# Patient Record
Sex: Female | Born: 1950 | Race: White | Hispanic: No | Smoking: Former smoker
Health system: Southern US, Community
[De-identification: ages and names within clinical notes are randomized; demographics above are authoritative.]

## PROBLEM LIST (undated history)

## (undated) DIAGNOSIS — M1711 Unilateral primary osteoarthritis, right knee: Secondary | ICD-10-CM

## (undated) DIAGNOSIS — K219 Gastro-esophageal reflux disease without esophagitis: Secondary | ICD-10-CM

## (undated) DIAGNOSIS — F329 Major depressive disorder, single episode, unspecified: Secondary | ICD-10-CM

## (undated) DIAGNOSIS — T4145XA Adverse effect of unspecified anesthetic, initial encounter: Secondary | ICD-10-CM

## (undated) DIAGNOSIS — E785 Hyperlipidemia, unspecified: Secondary | ICD-10-CM

## (undated) DIAGNOSIS — F419 Anxiety disorder, unspecified: Secondary | ICD-10-CM

## (undated) DIAGNOSIS — F32A Depression, unspecified: Secondary | ICD-10-CM

## (undated) DIAGNOSIS — F988 Other specified behavioral and emotional disorders with onset usually occurring in childhood and adolescence: Secondary | ICD-10-CM

## (undated) DIAGNOSIS — N809 Endometriosis, unspecified: Secondary | ICD-10-CM

## (undated) DIAGNOSIS — F819 Developmental disorder of scholastic skills, unspecified: Secondary | ICD-10-CM

## (undated) DIAGNOSIS — T8859XA Other complications of anesthesia, initial encounter: Secondary | ICD-10-CM

## (undated) DIAGNOSIS — T7840XA Allergy, unspecified, initial encounter: Secondary | ICD-10-CM

## (undated) DIAGNOSIS — R112 Nausea with vomiting, unspecified: Secondary | ICD-10-CM

## (undated) DIAGNOSIS — I10 Essential (primary) hypertension: Secondary | ICD-10-CM

## (undated) DIAGNOSIS — R05 Cough: Secondary | ICD-10-CM

## (undated) DIAGNOSIS — H269 Unspecified cataract: Secondary | ICD-10-CM

## (undated) DIAGNOSIS — M8580 Other specified disorders of bone density and structure, unspecified site: Secondary | ICD-10-CM

## (undated) DIAGNOSIS — B351 Tinea unguium: Secondary | ICD-10-CM

## (undated) DIAGNOSIS — J309 Allergic rhinitis, unspecified: Secondary | ICD-10-CM

## (undated) DIAGNOSIS — M47817 Spondylosis without myelopathy or radiculopathy, lumbosacral region: Secondary | ICD-10-CM

## (undated) DIAGNOSIS — G44219 Episodic tension-type headache, not intractable: Secondary | ICD-10-CM

## (undated) DIAGNOSIS — F341 Dysthymic disorder: Secondary | ICD-10-CM

## (undated) DIAGNOSIS — Z9889 Other specified postprocedural states: Secondary | ICD-10-CM

## (undated) HISTORY — DX: Endometriosis, unspecified: N80.9

## (undated) HISTORY — DX: Depression, unspecified: F32.A

## (undated) HISTORY — DX: Cough: R05

## (undated) HISTORY — DX: Essential (primary) hypertension: I10

## (undated) HISTORY — PX: EYE SURGERY: SHX253

## (undated) HISTORY — DX: Allergic rhinitis, unspecified: J30.9

## (undated) HISTORY — DX: Dysthymic disorder: F34.1

## (undated) HISTORY — DX: Tinea unguium: B35.1

## (undated) HISTORY — DX: Developmental disorder of scholastic skills, unspecified: F81.9

## (undated) HISTORY — DX: Gastro-esophageal reflux disease without esophagitis: K21.9

## (undated) HISTORY — PX: JOINT REPLACEMENT: SHX530

## (undated) HISTORY — DX: Unspecified cataract: H26.9

## (undated) HISTORY — DX: Episodic tension-type headache, not intractable: G44.219

## (undated) HISTORY — PX: OTHER SURGICAL HISTORY: SHX169

## (undated) HISTORY — DX: Hyperlipidemia, unspecified: E78.5

## (undated) HISTORY — DX: Allergy, unspecified, initial encounter: T78.40XA

## (undated) HISTORY — PX: COSMETIC SURGERY: SHX468

## (undated) HISTORY — DX: Other specified disorders of bone density and structure, unspecified site: M85.80

## (undated) HISTORY — DX: Anxiety disorder, unspecified: F41.9

## (undated) HISTORY — PX: COLONOSCOPY: SHX174

## (undated) HISTORY — PX: MENISCUS REPAIR: SHX5179

## (undated) HISTORY — DX: Other specified behavioral and emotional disorders with onset usually occurring in childhood and adolescence: F98.8

## (undated) HISTORY — DX: Major depressive disorder, single episode, unspecified: F32.9

---

## 1972-05-03 HISTORY — PX: NECK MASS EXCISION: SHX2079

## 1987-05-04 HISTORY — PX: LAPAROTOMY: SHX154

## 1992-05-03 HISTORY — PX: LAPAROSCOPY: SHX197

## 1999-03-25 ENCOUNTER — Other Ambulatory Visit: Admission: RE | Admit: 1999-03-25 | Discharge: 1999-03-25 | Payer: Self-pay | Admitting: Family Medicine

## 2000-09-27 ENCOUNTER — Encounter: Admission: RE | Admit: 2000-09-27 | Discharge: 2000-09-27 | Payer: Self-pay | Admitting: *Deleted

## 2002-03-09 ENCOUNTER — Ambulatory Visit (HOSPITAL_COMMUNITY): Admission: RE | Admit: 2002-03-09 | Discharge: 2002-03-09 | Payer: Self-pay | Admitting: *Deleted

## 2002-03-09 ENCOUNTER — Encounter (INDEPENDENT_AMBULATORY_CARE_PROVIDER_SITE_OTHER): Payer: Self-pay | Admitting: *Deleted

## 2003-05-04 ENCOUNTER — Encounter: Payer: Self-pay | Admitting: Family Medicine

## 2004-04-28 ENCOUNTER — Ambulatory Visit: Payer: Self-pay | Admitting: Family Medicine

## 2005-05-11 ENCOUNTER — Ambulatory Visit: Payer: Self-pay | Admitting: Family Medicine

## 2005-05-18 ENCOUNTER — Ambulatory Visit: Payer: Self-pay | Admitting: Family Medicine

## 2005-09-21 ENCOUNTER — Ambulatory Visit: Payer: Self-pay | Admitting: Family Medicine

## 2005-12-22 ENCOUNTER — Ambulatory Visit: Payer: Self-pay | Admitting: Family Medicine

## 2006-02-15 ENCOUNTER — Ambulatory Visit: Payer: Self-pay | Admitting: Family Medicine

## 2006-04-12 ENCOUNTER — Ambulatory Visit: Payer: Self-pay | Admitting: Family Medicine

## 2006-04-12 LAB — CONVERTED CEMR LAB
AST: 26 units/L (ref 0–37)
Chol/HDL Ratio, serum: 5.1
LDL DIRECT: 149.5 mg/dL

## 2006-07-28 ENCOUNTER — Encounter: Admission: RE | Admit: 2006-07-28 | Discharge: 2006-07-28 | Payer: Self-pay | Admitting: Family Medicine

## 2006-07-28 ENCOUNTER — Ambulatory Visit: Payer: Self-pay | Admitting: Family Medicine

## 2006-11-09 ENCOUNTER — Encounter: Payer: Self-pay | Admitting: Family Medicine

## 2006-12-29 ENCOUNTER — Telehealth: Payer: Self-pay | Admitting: Internal Medicine

## 2007-01-25 ENCOUNTER — Encounter: Payer: Self-pay | Admitting: Family Medicine

## 2007-01-25 DIAGNOSIS — F8189 Other developmental disorders of scholastic skills: Secondary | ICD-10-CM

## 2007-03-16 ENCOUNTER — Ambulatory Visit: Payer: Self-pay | Admitting: Family Medicine

## 2007-03-16 LAB — CONVERTED CEMR LAB
Bilirubin Urine: NEGATIVE
Glucose, Urine, Semiquant: NEGATIVE
Urobilinogen, UA: NEGATIVE

## 2007-03-23 ENCOUNTER — Encounter: Payer: Self-pay | Admitting: Family Medicine

## 2007-03-23 LAB — CONVERTED CEMR LAB
Basophils Relative: 0.7 % (ref 0.0–1.0)
Bilirubin, Direct: 0.1 mg/dL (ref 0.0–0.3)
CO2: 32 meq/L (ref 19–32)
Cholesterol: 160 mg/dL (ref 0–200)
Eosinophils Relative: 2.1 % (ref 0.0–5.0)
GFR calc Af Amer: 112 mL/min
Glucose, Bld: 95 mg/dL (ref 70–99)
HCT: 41 % (ref 36.0–46.0)
Hemoglobin: 14.5 g/dL (ref 12.0–15.0)
Lymphocytes Relative: 25.2 % (ref 12.0–46.0)
Monocytes Absolute: 0.6 10*3/uL (ref 0.2–0.7)
Monocytes Relative: 8.6 % (ref 3.0–11.0)
Neutro Abs: 4.4 10*3/uL (ref 1.4–7.7)
Neutrophils Relative %: 63.4 % (ref 43.0–77.0)
Potassium: 5.2 meq/L — ABNORMAL HIGH (ref 3.5–5.1)
Sodium: 142 meq/L (ref 135–145)
TSH: 1.73 microintl units/mL (ref 0.35–5.50)
Total Bilirubin: 0.5 mg/dL (ref 0.3–1.2)
Total Protein: 7.3 g/dL (ref 6.0–8.3)
VLDL: 43 mg/dL — ABNORMAL HIGH (ref 0–40)

## 2007-03-29 ENCOUNTER — Ambulatory Visit: Payer: Self-pay | Admitting: Family Medicine

## 2007-03-29 DIAGNOSIS — F341 Dysthymic disorder: Secondary | ICD-10-CM

## 2007-03-29 HISTORY — DX: Dysthymic disorder: F34.1

## 2007-04-17 ENCOUNTER — Encounter: Admission: RE | Admit: 2007-04-17 | Discharge: 2007-04-17 | Payer: Self-pay | Admitting: Obstetrics and Gynecology

## 2007-11-07 ENCOUNTER — Ambulatory Visit: Payer: Self-pay | Admitting: Family Medicine

## 2007-11-07 DIAGNOSIS — G44219 Episodic tension-type headache, not intractable: Secondary | ICD-10-CM

## 2007-11-07 DIAGNOSIS — R002 Palpitations: Secondary | ICD-10-CM | POA: Insufficient documentation

## 2007-11-07 HISTORY — DX: Episodic tension-type headache, not intractable: G44.219

## 2007-11-09 ENCOUNTER — Telehealth: Payer: Self-pay | Admitting: Family Medicine

## 2007-11-09 ENCOUNTER — Encounter: Payer: Self-pay | Admitting: Family Medicine

## 2007-12-11 ENCOUNTER — Ambulatory Visit: Payer: Self-pay

## 2007-12-11 ENCOUNTER — Ambulatory Visit: Payer: Self-pay | Admitting: Cardiology

## 2008-01-18 ENCOUNTER — Ambulatory Visit: Payer: Self-pay | Admitting: Family Medicine

## 2008-01-22 ENCOUNTER — Telehealth: Payer: Self-pay | Admitting: Family Medicine

## 2008-02-14 ENCOUNTER — Ambulatory Visit: Payer: Self-pay | Admitting: Licensed Clinical Social Worker

## 2008-02-27 ENCOUNTER — Telehealth: Payer: Self-pay | Admitting: Family Medicine

## 2008-08-06 ENCOUNTER — Encounter: Admission: RE | Admit: 2008-08-06 | Discharge: 2008-08-06 | Payer: Self-pay | Admitting: Obstetrics and Gynecology

## 2008-10-22 ENCOUNTER — Ambulatory Visit: Payer: Self-pay | Admitting: Family Medicine

## 2008-10-30 ENCOUNTER — Ambulatory Visit: Payer: Self-pay | Admitting: Family Medicine

## 2008-10-30 DIAGNOSIS — F988 Other specified behavioral and emotional disorders with onset usually occurring in childhood and adolescence: Secondary | ICD-10-CM

## 2008-10-30 DIAGNOSIS — L255 Unspecified contact dermatitis due to plants, except food: Secondary | ICD-10-CM

## 2008-10-30 DIAGNOSIS — B351 Tinea unguium: Secondary | ICD-10-CM | POA: Insufficient documentation

## 2008-10-30 HISTORY — DX: Tinea unguium: B35.1

## 2008-10-30 HISTORY — DX: Other specified behavioral and emotional disorders with onset usually occurring in childhood and adolescence: F98.8

## 2008-10-30 LAB — CONVERTED CEMR LAB
ALT: 26 units/L (ref 0–35)
BUN: 13 mg/dL (ref 6–23)
Basophils Relative: 0.4 % (ref 0.0–3.0)
Bilirubin, Direct: 0.1 mg/dL (ref 0.0–0.3)
CO2: 30 meq/L (ref 19–32)
Chloride: 105 meq/L (ref 96–112)
Cholesterol: 204 mg/dL — ABNORMAL HIGH (ref 0–200)
Creatinine, Ser: 0.5 mg/dL (ref 0.4–1.2)
Eosinophils Absolute: 0.2 10*3/uL (ref 0.0–0.7)
Eosinophils Relative: 3.7 % (ref 0.0–5.0)
HCT: 40.7 % (ref 36.0–46.0)
Ketones, ur: NEGATIVE mg/dL
Lymphs Abs: 1.6 10*3/uL (ref 0.7–4.0)
MCHC: 34.9 g/dL (ref 30.0–36.0)
MCV: 89.6 fL (ref 78.0–100.0)
Monocytes Absolute: 0.8 10*3/uL (ref 0.1–1.0)
Neutrophils Relative %: 58.6 % (ref 43.0–77.0)
Platelets: 273 10*3/uL (ref 150.0–400.0)
Potassium: 4.7 meq/L (ref 3.5–5.1)
RBC: 4.54 M/uL (ref 3.87–5.11)
TSH: 1.75 microintl units/mL (ref 0.35–5.50)
Total Protein: 7 g/dL (ref 6.0–8.3)
Urine Glucose: NEGATIVE mg/dL

## 2008-11-07 ENCOUNTER — Telehealth: Payer: Self-pay | Admitting: Family Medicine

## 2008-11-13 ENCOUNTER — Ambulatory Visit: Payer: Self-pay | Admitting: Family Medicine

## 2008-11-13 LAB — CONVERTED CEMR LAB
OCCULT 1: NEGATIVE
OCCULT 2: NEGATIVE
OCCULT 3: NEGATIVE

## 2008-11-26 ENCOUNTER — Encounter: Payer: Self-pay | Admitting: Family Medicine

## 2009-03-12 ENCOUNTER — Telehealth: Payer: Self-pay | Admitting: Family Medicine

## 2009-03-13 ENCOUNTER — Encounter (INDEPENDENT_AMBULATORY_CARE_PROVIDER_SITE_OTHER): Payer: Self-pay | Admitting: *Deleted

## 2009-03-14 ENCOUNTER — Ambulatory Visit: Payer: Self-pay | Admitting: Family Medicine

## 2009-03-18 ENCOUNTER — Ambulatory Visit: Payer: Self-pay | Admitting: Cardiology

## 2009-03-26 ENCOUNTER — Encounter (INDEPENDENT_AMBULATORY_CARE_PROVIDER_SITE_OTHER): Payer: Self-pay | Admitting: *Deleted

## 2009-03-31 ENCOUNTER — Ambulatory Visit: Payer: Self-pay | Admitting: Gastroenterology

## 2009-04-09 LAB — CONVERTED CEMR LAB
Calcium: 9 mg/dL (ref 8.4–10.5)
GFR calc non Af Amer: 91.78 mL/min (ref >60)
Sodium: 140 meq/L (ref 135–145)

## 2009-04-11 ENCOUNTER — Ambulatory Visit: Payer: Self-pay | Admitting: Gastroenterology

## 2009-04-16 ENCOUNTER — Telehealth: Payer: Self-pay | Admitting: Family Medicine

## 2009-04-22 ENCOUNTER — Encounter: Payer: Self-pay | Admitting: Gastroenterology

## 2009-04-23 LAB — HM COLONOSCOPY

## 2009-05-16 ENCOUNTER — Encounter: Payer: Self-pay | Admitting: Family Medicine

## 2009-05-21 ENCOUNTER — Encounter: Payer: Self-pay | Admitting: Family Medicine

## 2010-01-06 ENCOUNTER — Telehealth (INDEPENDENT_AMBULATORY_CARE_PROVIDER_SITE_OTHER): Payer: Self-pay | Admitting: *Deleted

## 2010-04-21 ENCOUNTER — Ambulatory Visit: Payer: Self-pay | Admitting: Family Medicine

## 2010-04-21 DIAGNOSIS — R0989 Other specified symptoms and signs involving the circulatory and respiratory systems: Secondary | ICD-10-CM | POA: Insufficient documentation

## 2010-04-21 DIAGNOSIS — R059 Cough, unspecified: Secondary | ICD-10-CM

## 2010-04-21 DIAGNOSIS — K219 Gastro-esophageal reflux disease without esophagitis: Secondary | ICD-10-CM

## 2010-04-21 DIAGNOSIS — J309 Allergic rhinitis, unspecified: Secondary | ICD-10-CM

## 2010-04-21 DIAGNOSIS — R05 Cough: Secondary | ICD-10-CM | POA: Insufficient documentation

## 2010-04-21 HISTORY — DX: Gastro-esophageal reflux disease without esophagitis: K21.9

## 2010-04-21 HISTORY — DX: Cough, unspecified: R05.9

## 2010-04-21 HISTORY — DX: Allergic rhinitis, unspecified: J30.9

## 2010-04-22 ENCOUNTER — Encounter: Payer: Self-pay | Admitting: Family Medicine

## 2010-04-23 ENCOUNTER — Telehealth: Payer: Self-pay | Admitting: Family Medicine

## 2010-04-24 ENCOUNTER — Ambulatory Visit: Payer: Self-pay

## 2010-05-05 ENCOUNTER — Telehealth: Payer: Self-pay | Admitting: Family Medicine

## 2010-05-06 ENCOUNTER — Encounter: Payer: Self-pay | Admitting: Family Medicine

## 2010-05-12 ENCOUNTER — Encounter: Payer: Self-pay | Admitting: *Deleted

## 2010-05-15 ENCOUNTER — Ambulatory Visit
Admission: RE | Admit: 2010-05-15 | Discharge: 2010-05-15 | Payer: Self-pay | Source: Home / Self Care | Attending: Internal Medicine | Admitting: Internal Medicine

## 2010-05-15 DIAGNOSIS — E785 Hyperlipidemia, unspecified: Secondary | ICD-10-CM | POA: Diagnosis present

## 2010-05-15 DIAGNOSIS — I10 Essential (primary) hypertension: Secondary | ICD-10-CM | POA: Insufficient documentation

## 2010-05-15 HISTORY — DX: Hyperlipidemia, unspecified: E78.5

## 2010-05-15 HISTORY — DX: Essential (primary) hypertension: I10

## 2010-05-24 ENCOUNTER — Encounter: Payer: Self-pay | Admitting: Obstetrics and Gynecology

## 2010-05-31 LAB — CONVERTED CEMR LAB: Pap Smear: NORMAL

## 2010-06-02 NOTE — Miscellaneous (Signed)
Summary: Flu Vaccination/[facility]  Flu Vaccination/[facility]   Imported By: Maryln Gottron 05/23/2009 12:50:29  _____________________________________________________________________  External Attachment:    Type:   Image     Comment:   External Document

## 2010-06-02 NOTE — Miscellaneous (Signed)
Summary: flu inj given at [facility]   Clinical Lists Changes  Observations: Added new observation of FLU VAX: Historical (05/16/2009 10:56)      Immunization History:  Influenza Immunization History:    Influenza:  historical (05/16/2009)  per pt had inj at [facility]

## 2010-06-02 NOTE — Progress Notes (Signed)
Summary: Flu vaccine     Flu vaccine was given on 01/03/10 per [facility]

## 2010-06-04 NOTE — Letter (Signed)
Summary: Generic Letter  [place] at [facility]  [address] [place], [place] [address]   Phone: [phone]  Fax: [phone]    05/12/2010  [facility] [address] [place], [place]  [address]  Dear Ms. Chiappetta,  Your carotid doppler was normal. If you have any questions, please give [place] a call at [phone].         Sincerely,   Tor Netters, CMA (AAMA)

## 2010-06-04 NOTE — Miscellaneous (Signed)
Summary: Orders Update  Clinical Lists Changes  Orders: Added new Test order of Carotid Duplex (Carotid Duplex) - Signed 

## 2010-06-04 NOTE — Assessment & Plan Note (Signed)
Summary: BP ISSUES//CCM   Vital Signs:  Patient profile:   60 year old female Weight:      172 pounds Pulse rate:   112 / minute BP sitting:   144 / 100  (left arm)  Vitals Entered By: Kyung Rudd, CMA (May 15, 2010 8:44 AM) CC: pt c/o elevated BP...also needs refills   CC:  pt c/o elevated BP...also needs refills.  History of Present Illness: Patient presents to clinic as a workin for evaluation of elevated bp. Notes recently increased BP without ha or dizziness. Reviewed outpt bp log with sbp 140's 150's predominantly. H/o ADD previously taking adderall but currently out of medication. H/o hyperlipidemia and has self dc'ed simvastatin due to possible muscle weakness. Sx's resolved after stopping medication. Also intolerant of lipitor in the past. States recently underwent carotid US. Reviewed nl results with pt.  Current Medications (verified): 1)  Alprazolam 2 Mg  Tabs (Alprazolam) .[name].. 1  Three Times A Day 2)  Esgic-Plus 50-500-40 Mg  Tabs (Butalbital-Apap-Caffeine) .... As Needed  1-2 Q4h As Needed Headache Not Over 4 Per Day 3)  Adderall Xr 20 Mg  Cp24 (Amphetamine-Dextroamphetamine) .... Take 1 Capsule By Mouth Once A Day Fill For Dec 2010 4)  Calcium-Vitamin D 500-125 Mg-Unit  Tabs (Calcium-Vitamin D) 5)  Cyproheptadine Hcl 4 Mg  Tabs (Cyproheptadine Hcl) .[name].. 1 Three Times A Day As Needed For Itching 6)  Astelin 137 Mcg/spray  Soln (Azelastine Hcl) .... 2 Spray Each Nostril Am and Pm 7)  Simvastatin 80 Mg  Tabs (Simvastatin) .[name].. 1 By Mouth At Bedtime 8)  Effexor Xr 150 Mg Xr24h-Cap (Venlafaxine Hcl) .[name].. 1 By Mouth Once Daily 9)  Lotrisone 1-0.05 % Crea (Clotrimazole-Betamethasone) .... Apply Bid 10)  Premarin 0.625 Mg/gm Crea (Estrogens, Conjugated) .... Insert Applicator 11)  Metronidazole 500 Mg Tabs (Metronidazole) .[name].. 1 Tid 12)  Adderall Xr 20 Mg Xr24h-Cap (Amphetamine-Dextroamphetamine) .[name].. 1 By Mouth Once Daily  Fill For Jan 2011 13)  Adderall Xr 20 Mg  Xr24h-Cap (Amphetamine-Dextroamphetamine) .[name].. 1 By Mouth Once Daily  Fill For Feb 2011  Allergies (verified): 1)  ! Sulfamethoxazole (Sulfamethoxazole)  Past History:  Past medical, surgical, family and social histories (including risk factors) reviewed, and no changes noted (except as noted below).  Past Medical History: Reviewed history from 04/21/2010 and no changes required. learning disability hyperlipidemia chronic anxiety and depression GERD  Family History: Reviewed history and no changes required.  Social History: Reviewed history and no changes required.  Review of Systems General:  Denies chills, fever, and sweats. Eyes:  Denies eye irritation, eye pain, and red eye. CV:  Denies chest pain or discomfort, fainting, near fainting, and shortness of breath with exertion. Resp:  Denies chest discomfort, chest pain with inspiration, cough, and shortness of breath. GI:  Denies abdominal pain and bloody stools.  Physical Exam  General:  Well-developed,well-nourished,in no acute distress; alert,appropriate and cooperative throughout examination Head:  Normocephalic and atraumatic without obvious abnormalities. No apparent alopecia or balding. Eyes:  pupils equal, pupils round, corneas and lenses clear, and no injection.   Ears:  no external deformities.   Nose:  no external deformity.   Neck:  No deformities, masses, or tenderness noted. No carotid bruits Lungs:  Normal respiratory effort, chest expands symmetrically. Lungs are clear to auscultation, no crackles or wheezes. Heart:  Normal rate and regular rhythm. S1 and S2 normal without gallop, murmur, click, rub or other extra sounds. Neurologic:  alert & oriented X3 and gait normal.  Skin:  turgor normal, color normal, and no rashes.     Impression & Recommendations:  Problem # 1:  ESSENTIAL HYPERTENSION, BENIGN (ICD-401.1) Assessment New New formal diagnosis. Asx. Recommend low sodium diet, regular aerobic  exercise at least 4x/wk and wt loss. Maintain oupt bp log. F/u in 6wks for review of bp control.  Problem # 2:  HYPERLIPIDEMIA (ICD-272.4) Assessment: Deteriorated Stop zocor. Avoid lipitor. Attempt low dose pravastatin. Obtain flp/lft prior to next visit.  The following medications were removed from the medication list:    Simvastatin 80 Mg Tabs (Simvastatin) .[name]... 1 by mouth at bedtime Her updated medication list for this problem includes:    Pravachol 20 Mg Tabs (Pravastatin sodium) ..... One by mouth qd  Problem # 3:  ADD (ICD-314.00) Assessment: Unchanged Off adderall. Recommend avoiding stimulant medication currently due to elevated BP. States understanding and believes ADD under adequate control.  Complete Medication List: 1)  Alprazolam 2 Mg Tabs (Alprazolam) .[name].. 1  three times a day 2)  Esgic-plus 50-500-40 Mg Tabs (Butalbital-apap-caffeine) .... As needed  1-2 q4h as needed headache not over 4 per day 3)  Calcium-vitamin D 500-125 Mg-unit Tabs (Calcium-vitamin d) 4)  Cyproheptadine Hcl 4 Mg Tabs (Cyproheptadine hcl) .[name].. 1 three times a day as needed for itching 5)  Astelin 137 Mcg/spray Soln (Azelastine hcl) .... 2 spray each nostril am and pm 6)  Effexor Xr 150 Mg Xr24h-cap (Venlafaxine hcl) .[name].. 1 by mouth once daily 7)  Lotrisone 1-0.05 % Crea (Clotrimazole-betamethasone) .... Apply bid 8)  Premarin 0.625 Mg/gm Crea (Estrogens, conjugated) .... Insert applicator 9)  Metronidazole 500 Mg Tabs (Metronidazole) .[name].. 1 tid 10)  Adderall Xr 20 Mg Xr24h-cap (Amphetamine-dextroamphetamine) .[name].. 1 by mouth once daily  fill for jan 2011 11)  Adderall Xr 20 Mg Xr24h-cap (Amphetamine-dextroamphetamine) .[name].. 1 by mouth once daily  fill for feb 2011 12)  Pravachol 20 Mg Tabs (Pravastatin sodium) .... One by mouth qd  Patient Instructions: 1)  Followup 6 week with Dr. Rodena Medin. 2)  Hepatic Panel prior to visit, ICD-9:272.4 3)  Lipid Panel prior to visit,  ICD-9:272.4 Prescriptions: PRAVACHOL 20 MG TABS (PRAVASTATIN SODIUM) one by mouth qd  #30 x 6   Entered and Authorized by:   Edwyna Perfect MD   Signed by:   Edwyna Perfect MD on 05/15/2010   Method used:   Print then Give to Patient   RxID:   [id number]    Orders Added: 1)  Est. Patient Level IV [46962]

## 2010-06-04 NOTE — Progress Notes (Signed)
  Phone Note From Pharmacy   Caller: [facility] Summary of Call: [facility] is calling to see if you wanted zocor 80 mg for this patient because of side effects. would you like pravastatin or lipitor? info has been faxed to office [phone] reference number. [phone] #2 to pharmacy. Initial call taken by: Kern Reap CMA Duncan Dull),  May 05, 2010 11:59 AM  Follow-up for Phone Call        continue simvastatin

## 2010-06-04 NOTE — Assessment & Plan Note (Signed)
Summary: MED CK / REFILL  // RS   Vital Signs:  Patient profile:   60 year old female Weight:      175 pounds BMI:     31.61 Temp:     99.3 degrees F oral Pulse rate:   93 / minute Pulse rhythm:   regular BP sitting:   132 / 90  (left arm) Cuff size:   regular  Vitals Entered By: Alfred Levins, CMA (April 21, 2010 4:28 PM) CC: renew meds   History of Present Illness: This at some-year-old white widowed think widowed patient is unknown was certainly feel 2 years ago says caused the patient to have a chronic anxiety and depression that she has improved she she relates. Her present complaint is that of a persisting cough and was seen previously in regard to infection but was not remarkable. I have some concern as to whether this esophageal reflux cough Patient is in for refill of her medication  Current Medications (verified): 1)  Alprazolam 2 Mg  Tabs (Alprazolam) .[name].. 1  Three Times A Day 2)  Esgic-Plus 50-500-40 Mg  Tabs (Butalbital-Apap-Caffeine) .... As Needed  1-2 Q4h As Needed Headache Not Over 4 Per Day 3)  Adderall Xr 20 Mg  Cp24 (Amphetamine-Dextroamphetamine) .... Take 1 Capsule By Mouth Once A Day Fill For Dec 2010 4)  Calcium-Vitamin D 500-125 Mg-Unit  Tabs (Calcium-Vitamin D) 5)  Cyproheptadine Hcl 4 Mg  Tabs (Cyproheptadine Hcl) .[name].. 1 Three Times A Day As Needed For Itching 6)  Astelin 137 Mcg/spray  Soln (Azelastine Hcl) .... 2 Spray Each Nostril Am and Pm 7)  Simvastatin 80 Mg  Tabs (Simvastatin) .[name].. 1 Hs 8)  Effexor Xr 150 Mg Xr24h-Cap (Venlafaxine Hcl) .[name].. 1 By Mouth Once Daily 9)  Cymbalta 60 Mg Cpep (Duloxetine Hcl) .... 2 Caps Qd 10)  Lotrisone 1-0.05 % Crea (Clotrimazole-Betamethasone) .... Apply Bid 11)  Lamisil 250 Mg Tabs (Terbinafine Hcl) .[name].. 1  Once Daily X 3 Months 12)  Premarin 0.625 Mg/gm Crea (Estrogens, Conjugated) .... Insert Applicator 13)  Metronidazole 500 Mg Tabs (Metronidazole) .[name].. 1 Tid 14)  Adderall Xr 20 Mg Xr24h-Cap  (Amphetamine-Dextroamphetamine) .[name].. 1 By Mouth Once Daily  Fill For Jan 2011 15)  Adderall Xr 20 Mg Xr24h-Cap (Amphetamine-Dextroamphetamine) .[name].. 1 By Mouth Once Daily  Fill For Feb 2011  Allergies (verified): 1)  ! Sulfamethoxazole (Sulfamethoxazole)  Past History:  Past Medical History: learning disability hyperlipidemia chronic anxiety and depression GERD  Physical Exam  General:  Well-developed,well-nourished,in no acute distress; alert,appropriate and cooperative throughout examination Head:  Normocephalic and atraumatic without obvious abnormalities. No apparent alopecia or balding. Eyes:  No corneal or conjunctival inflammation noted. EOMI. Perrla. Funduscopic exam benign, without hemorrhages, exudates or papilledema. Vision grossly normal. Ears:  External ear exam shows no significant lesions or deformities.  Otoscopic examination reveals clear canals, tympanic membranes are intact bilaterally without bulging, retraction, inflammation or discharge. Hearing is grossly normal bilaterally. Nose:  External nasal examination shows no deformity or inflammation. Nasal mucosa are pink and moist without lesions or exudates. Mouth:  Oral mucosa and oropharynx without lesions or exudates.  Teeth in good repair. Neck:  No deformities, masses, or tenderness noted. Chest Wall:  No deformities, masses, or tenderness noted. Breasts:  not examined Lungs:  Normal respiratory effort, chest expands symmetrically. Lungs are clear to auscultation, no crackles or wheezes. Heart:  Normal rate and regular rhythm. S1 and S2 normal without gallop, murmur, click, rub or other extra sounds. Abdomen:  Bowel sounds positive,abdomen soft and non-tender without masses, organomegaly or hernias noted.   Impression & Recommendations:  Problem # 1:  COUGH (ICD-786.2) Assessment New Hydromet 1-2 teaspoons q.4 h. p.r.n. for cough AcipHex 20 mg for GERD  Problem # 2:  GERD (ICD-530.81) Assessment:  New AcipHex 20 mg q.d.  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 Cymbalta 60 mg 2 Q. day  Problem # 5:  ADD (ICD-314.00) Assessment: Unchanged Adderall XR 20 mg one cap q. day  Problem # 6:  ANXIETY DEPRESSION (ICD-300.4)  Complete Medication List: 1)  Alprazolam 2 Mg Tabs (Alprazolam) .[name].. 1  three times a day 2)  Esgic-plus 50-500-40 Mg Tabs (Butalbital-apap-caffeine) .... As needed  1-2 q4h as needed headache not over 4 per day 3)  Adderall Xr 20 Mg Cp24 (Amphetamine-dextroamphetamine) .... Take 1 capsule by mouth once a day fill for dec 2010 4)  Calcium-vitamin D 500-125 Mg-unit Tabs (Calcium-vitamin d) 5)  Cyproheptadine Hcl 4 Mg Tabs (Cyproheptadine hcl) .[name].. 1 three times a day as needed for itching 6)  Astelin 137 Mcg/spray Soln (Azelastine hcl) .... 2 spray each nostril am and pm 7)  Simvastatin 80 Mg Tabs (Simvastatin) .[name].. 1 by mouth at bedtime 8)  Effexor Xr 150 Mg Xr24h-cap (Venlafaxine hcl) .[name].. 1 by mouth once daily 9)  Lotrisone 1-0.05 % Crea (Clotrimazole-betamethasone) .... Apply bid 10)  Premarin 0.625 Mg/gm Crea (Estrogens, conjugated) .... Insert applicator 11)  Metronidazole 500 Mg Tabs (Metronidazole) .[name].. 1 tid 12)  Adderall Xr 20 Mg Xr24h-cap (Amphetamine-dextroamphetamine) .[name].. 1 by mouth once daily  fill for jan 2011 13)  Adderall Xr 20 Mg Xr24h-cap (Amphetamine-dextroamphetamine) .[name].. 1 by mouth once daily  fill for feb 2011  Patient Instructions: 1)  failure cough is secondary to GERD, esophageal reflux 2)  A KUB was better and will refill all her medications and return for a complete physical examination when he did be Prescriptions: EFFEXOR XR 150 MG XR24H-CAP (VENLAFAXINE HCL) 1 by mouth once daily  #90 x 3   Entered and Authorized by:   Judithann Sheen MD   Signed by:   Judithann Sheen MD on 04/21/2010   Method used:   Faxed to ...       [facility] (mail-order)             , [place]         Ph: [phone]       Fax: [phone]   RxID:   [id number] SIMVASTATIN 80 MG  TABS (SIMVASTATIN) 1 hs  #90 x 3   Entered and Authorized by:   Judithann Sheen MD   Signed by:   Judithann Sheen MD on 04/21/2010   Method used:   Faxed to ...       [facility] (mail-order)             , [place]         Ph: [phone]       Fax: [phone]   RxID:   [id number] ADDERALL XR 20 MG  CP24 (AMPHETAMINE-DEXTROAMPHETAMINE) Take 1 capsule by mouth once a day fill for dec 2010  #270 x 0   Entered and Authorized by:   Judithann Sheen MD   Signed by:   Judithann Sheen MD on 04/21/2010   Method used:  Printed then faxed to ...       [facility] (mail-order)             , [place]         Ph: [phone]       Fax: [phone]   RxID:   [id number] ESGIC-PLUS 50-500-40 MG  TABS [facility]) as needed  1-2 q4h as needed headache not over 4 per day  #330 x 1   Entered and Authorized by:   Judithann Sheen MD   Signed by:   Judithann Sheen MD on 04/21/2010   Method used:   Printed then faxed to ...       [facility] (mail-order)             , [place]         Ph: [phone]       Fax: [phone]   RxID:   [id number] ALPRAZOLAM 2 MG  TABS (ALPRAZOLAM) 1  three times a day  #270 x 1   Entered and Authorized by:   Judithann Sheen MD   Signed by:   Judithann Sheen MD on 04/21/2010   Method used:   Printed then faxed to ...       [facility] (mail-order)             , [place]         Ph: [phone]       Fax: [phone]   RxID:   [id number]    Orders Added: 1)  Doppler Referral [Doppler] 2)  Est. Patient Level IV [37628]

## 2010-06-04 NOTE — Progress Notes (Signed)
Summary: [facility] needs clarification on Zocor 80mg   Phone Note From Pharmacy Call back at [facility] [phone] option #2  ref [phone]   Caller: [facility] Summary of Call: [facility] needs clarification on dosage for Zocor 80mg  and instructions.  Initial call taken by: Lucy Antigua,  April 23, 2010 3:15 PM  Follow-up for Phone Call        Phone call completed, Pharmacist called Follow-up by: Alfred Levins, CMA,  April 23, 2010 4:56 PM    New/Updated Medications: SIMVASTATIN 80 MG  TABS (SIMVASTATIN) 1 by mouth at bedtime Prescriptions: SIMVASTATIN 80 MG  TABS (SIMVASTATIN) 1 by mouth at bedtime  #90 x 3   Entered by:   Alfred Levins, CMA   Authorized by:   Judithann Sheen MD   Signed by:   Alfred Levins, CMA on 04/23/2010   Method used:   Electronically to        [facility]* (retail)             ,          Ph: [phone]       Fax: [phone]   RxID:   [id number]

## 2010-06-08 ENCOUNTER — Encounter: Payer: Self-pay | Admitting: Internal Medicine

## 2010-06-26 ENCOUNTER — Other Ambulatory Visit: Payer: Self-pay

## 2010-07-20 ENCOUNTER — Ambulatory Visit: Payer: Self-pay | Admitting: Internal Medicine

## 2010-09-15 NOTE — Assessment & Plan Note (Signed)
[facility]                            CARDIOLOGY OFFICE NOTE   NAME:Julia, Zimmerman                     MRN:          [id number]  DATE:12/11/2007                            DOB:          1951-02-11    PRIMARY CARE PHYSICIAN:  Tawny Asal, MD   REASON FOR CONSULTATION:  Evaluate the patient with palpitations.   HISTORY OF PRESENT ILLNESS:  The patient is a pleasant 60 year old white  female without prior cardiac history.  She reports over 4-5 months she  has had episodes of palpitations.  She describes these as her heart  pounding.  Wakes her up from her sleep.  Feels like her heart is going  fast.  She has to get up.  She takes an aspirin.  It lasts for several  minutes.  She has had a couple of episodes while awake watching TV.  She  says she has some fluttering, but the predominant feeling is that it is  going fast and strong.  She does not have any presyncope or syncope.  She does not have any chest discomfort, neck or arm discomfort.  She  does not have any shortness of breath, although she does have a feeling  like she cannot really take a deep breath and she gets anxious.  She is  otherwise active.  She is able to push a lawn mower without bringing on  any symptoms.   PAST MEDICAL HISTORY:  1. Anxiety and depression, well treated.  2. Hyperlipidemia.   PAST SURGICAL HISTORY:  Laparoscopic removal of endometrial tumors x2.   ALLERGIES/INTOLERANCES:  SULFA.   MEDICATIONS:  Effexor, alprazolam, calcium, Astelin, simvastatin 80 mg  daily, and Aciphex.   SOCIAL HISTORY:  The patient is a Emergency planning/management officer.  She was recently  widowed with her husband dying suddenly.  She has no children.  She quit  smoking in 1992.  She does not drink alcohol.  She drinks about 3  caffeinated beverages a week.   FAMILY HISTORY:  Positive for palpitations in both her father and her  sister.  There is no early heart disease.  There is no presyncope,  syncope, or sudden death.  There is nobody with heart failure  defibrillators.   PAST MEDICAL HISTORY:  Headaches, occasional cough, reflux, joint pains.  Negative for all other systems.   PHYSICAL EXAMINATION:  GENERAL:  The patient is pleasant and in no  distress.  VITAL SIGNS:  Blood pressure 140/80, heart rate 80 and regular, and  weight 163 pounds.  HEENT:  Eyes unremarkable.  Pupils equal, round, and reactive to light.  Fundi not visualized.  Oral mucosa unremarkable.  NECK:  No jugular venous distention at 45 degrees.  Carotid upstroke  brisk and symmetric.  No bruits.  No thyromegaly.  LYMPHATICS:  No cervical, axillary, or inguinal adenopathy.  LUNGS:  Clear to auscultation bilaterally.  BACK:  No costovertebral angle mass.  CHEST:  Unremarkable.  HEART:  PMI not displaced or sustained.  S1 and S2 within normal limits.  No S3.  No S4.  No clicks.  No rubs.  No  murmurs.  ABDOMEN:  Flat.  Positive bowel sounds.  Normal in frequency and pitch.  No bruits.  No rebound.  No guarding.  No midline pulsatile mass.  No  hepatomegaly.  No splenomegaly.  SKIN:  No rashes.  No nodules.  EXTREMITIES:  2+ pulses throughout.  No edema, cyanosis, or clubbing.  NEURO:  Oriented to person, place, and time.  Cranial nerves II through  XII grossly intact.  Motor grossly intact.   EKG (done at Dr. Charmian Muff office), sinus rhythm, rate 72.  Axis within  normals.  Intervals within normal limits.  Poor anterior R-wave  progression.  No acute ST-wave changes.   ASSESSMENT/PLAN:  1. Palpitations.  The patient is having palpitations about once a      week.  These have been stable, but very bothersome.  They have been      going on for 4-5 months.  She describes a rapid rate, it may be a      sustained tachyarrhythmia.  Given this, a 2-week event monitor      should be helpful.  Further evaluation based on these results.  We      will check with Dr. Charmian Muff office to make sure she has had a       lipid profile.  2. Depression and anxiety.  This is well treated.  She will continue      with the medications as listed.  This may be contributing to the      above symptoms.  3. Dyslipidemia per Dr. Scotty Court.  She is on simvastatin and has a      good lipid profile.  4. Follow up will be in a few weeks after the event monitor.     Rollene Rotunda, MD, [facility]  Electronically Signed    JH/MedQ  DD: 12/11/2007  DT: 12/12/2007  Job #: [id number]   cc:   Ellin Saba., MD

## 2010-09-18 NOTE — Op Note (Signed)
   NAME:  Julia Zimmerman, Julia Zimmerman                        ACCOUNT NO.:  [id number]   MEDICAL RECORD NO.:  [id number]                   PATIENT TYPE:  AMB   LOCATION:  ENDO                                 FACILITY:  [facility]   PHYSICIAN:  Georgiana Spinner, M.D.                 DATE OF BIRTH:  Apr 05, 1951   DATE OF PROCEDURE:  03/09/2002  DATE OF DISCHARGE:                                 OPERATIVE REPORT   PROCEDURE:  Colonoscopy.   INDICATIONS:  Rectal bleeding.  Colon cancer screening.   ANESTHESIA:  Demerol 20, Versed 2 mg.   DESCRIPTION OF PROCEDURE:  With the patient mildly sedated in the left  lateral decubitus position, the Olympus videoscopic colonoscope was inserted  in the rectum and passed under direct vision to the cecum, identified by the  ileocecal valve and appendiceal orifice.  From this point, the colonoscope  was slowly withdrawn, taking circumferential views of the entire colonic  mucosa, stopping only in the rectum which appeared normal except for a small  polyp which was photographed and removed using hot biopsy forceps technique,  setting of 20-20 blended current.  The endoscope was placed on retroflexion  to view the anal canal from above.  Hemorrhoids were seen.  The endoscope  was straightened and withdrawn.  The patient's vital signs and pulse  oximeter remained stable.  The patient tolerated the procedure well without  apparent complications.   FINDINGS:  1. Small polyp of rectum.  2. Internal hemorrhoids.   PLAN:  1. Await biopsy report.  2. The patient will call me for results and follow up with me as an     outpatient.                                               Georgiana Spinner, M.D.    GMO/MEDQ  D:  03/09/2002  T:  03/09/2002  Job:  [id number]   cc:   Lacretia Leigh. Quintella Reichert, M.D.  [facility] [address] [place]  [place] [address]  Fax: [phone]

## 2010-09-18 NOTE — Op Note (Signed)
   NAME:  Julia Zimmerman, Julia Zimmerman                        ACCOUNT NO.:  [id number]   MEDICAL RECORD NO.:  [id number]                   PATIENT TYPE:  AMB   LOCATION:  ENDO                                 FACILITY:  [facility]   PHYSICIAN:  Georgiana Spinner, M.D.                 DATE OF BIRTH:  1951/04/30   DATE OF PROCEDURE:  03/09/2002  DATE OF DISCHARGE:                                 OPERATIVE REPORT   PROCEDURE:  Upper endoscopy.   INDICATIONS:  GERD.   ANESTHESIA:  Demerol 70, Versed 7 mg.   DESCRIPTION OF PROCEDURE:  With patient mildly sedated in the left lateral  decubitus position, the Olympus videoscopic endoscope was inserted in the  mouth and passed under direct vision through the esophagus, which appeared  normal, into the stomach.  Fundus, body appeared normal.  Antrum showed  possible changes of gastritis, and there was slight erythematous change of  the mucosa which was photographed and biopsied.  We entered into the  duodenal bulb and second portion of duodenum; both appeared normal.  From  this point, the endoscope was slowly withdrawn, taking circumferential views  of the duodenal mucosa until the endoscope then pulled back into the  stomach, placed in retroflexion to view the stomach from below.  The  endoscope was then straightened and withdrawn, taking circumferential views  of the remaining gastric and esophageal mucosa.  The patient's vital signs  and pulse oximeter remained stable.  The patient tolerated the procedure  well without apparent complications.   FINDINGS:  Question of gastritis, biopsied.   PLAN:  1. Await biopsy report.  2. The patient will call me for results and follow up with me as outpatient.  3. Proceed to colonoscopy as planned.                                               Georgiana Spinner, M.D.    GMO/MEDQ  D:  03/09/2002  T:  03/09/2002  Job:  [id number]   cc:   Lacretia Leigh. Quintella Reichert, M.D.  [place] [address] [place]  [place] [address]  Fax:  [phone]

## 2010-10-19 ENCOUNTER — Other Ambulatory Visit: Payer: Self-pay | Admitting: Family Medicine

## 2010-10-19 ENCOUNTER — Other Ambulatory Visit (INDEPENDENT_AMBULATORY_CARE_PROVIDER_SITE_OTHER): Payer: BC Managed Care – PPO

## 2010-10-19 DIAGNOSIS — Z0000 Encounter for general adult medical examination without abnormal findings: Secondary | ICD-10-CM

## 2010-10-19 LAB — CBC WITH DIFFERENTIAL/PLATELET
Basophils Absolute: 0 10*3/uL (ref 0.0–0.1)
Eosinophils Absolute: 0.2 10*3/uL (ref 0.0–0.7)
HCT: 42.7 % (ref 36.0–46.0)
Lymphs Abs: 1.5 10*3/uL (ref 0.7–4.0)
MCHC: 34.2 g/dL (ref 30.0–36.0)
MCV: 90.1 fl (ref 78.0–100.0)
Monocytes Absolute: 0.6 10*3/uL (ref 0.1–1.0)
Monocytes Relative: 10.1 % (ref 3.0–12.0)
Neutro Abs: 3.6 10*3/uL (ref 1.4–7.7)
Platelets: 349 10*3/uL (ref 150.0–400.0)
RDW: 13 % (ref 11.5–14.6)

## 2010-10-19 LAB — URINALYSIS
Hgb urine dipstick: NEGATIVE
Total Protein, Urine: NEGATIVE
Urine Glucose: NEGATIVE
pH: 5.5 (ref 5.0–8.0)

## 2010-10-19 LAB — BASIC METABOLIC PANEL
Calcium: 8.9 mg/dL (ref 8.4–10.5)
GFR: 104.99 mL/min (ref >60.00)
Glucose, Bld: 95 mg/dL (ref 70–99)
Sodium: 141 mEq/L (ref 135–145)

## 2010-10-19 LAB — TSH: TSH: 1.48 u[IU]/mL (ref 0.35–5.50)

## 2010-10-19 LAB — HEPATIC FUNCTION PANEL
ALT: 29 U/L (ref 0–35)
AST: 30 U/L (ref 0–37)
Alkaline Phosphatase: 134 U/L — ABNORMAL HIGH (ref 39–117)
Total Bilirubin: 0.5 mg/dL (ref 0.3–1.2)

## 2010-10-19 LAB — LIPID PANEL
HDL: 48.6 mg/dL (ref >39.00)
Triglycerides: 251 mg/dL — ABNORMAL HIGH (ref 0.0–149.0)

## 2010-10-19 LAB — LDL CHOLESTEROL, DIRECT: Direct LDL: 124 mg/dL

## 2010-10-20 ENCOUNTER — Other Ambulatory Visit: Payer: Self-pay | Admitting: Family Medicine

## 2010-10-20 DIAGNOSIS — Z0000 Encounter for general adult medical examination without abnormal findings: Secondary | ICD-10-CM

## 2010-10-27 ENCOUNTER — Other Ambulatory Visit (HOSPITAL_COMMUNITY)
Admission: RE | Admit: 2010-10-27 | Discharge: 2010-10-27 | Disposition: A | Discharge status: Home / Self Care | Payer: BC Managed Care – PPO | Source: Ambulatory Visit | Attending: Family Medicine | Admitting: Family Medicine

## 2010-10-27 ENCOUNTER — Ambulatory Visit (INDEPENDENT_AMBULATORY_CARE_PROVIDER_SITE_OTHER): Payer: BC Managed Care – PPO | Admitting: Family Medicine

## 2010-10-27 ENCOUNTER — Encounter: Payer: Self-pay | Admitting: Family Medicine

## 2010-10-27 DIAGNOSIS — Z01419 Encounter for gynecological examination (general) (routine) without abnormal findings: Secondary | ICD-10-CM | POA: Insufficient documentation

## 2010-10-27 DIAGNOSIS — Z780 Asymptomatic menopausal state: Secondary | ICD-10-CM

## 2010-10-27 DIAGNOSIS — Z124 Encounter for screening for malignant neoplasm of cervix: Secondary | ICD-10-CM

## 2010-10-27 DIAGNOSIS — N951 Menopausal and female climacteric states: Secondary | ICD-10-CM

## 2010-10-27 DIAGNOSIS — J310 Chronic rhinitis: Secondary | ICD-10-CM

## 2010-10-27 DIAGNOSIS — Z0000 Encounter for general adult medical examination without abnormal findings: Secondary | ICD-10-CM

## 2010-10-27 DIAGNOSIS — F909 Attention-deficit hyperactivity disorder, unspecified type: Secondary | ICD-10-CM

## 2010-10-27 DIAGNOSIS — F329 Major depressive disorder, single episode, unspecified: Secondary | ICD-10-CM

## 2010-10-27 MED ORDER — VENLAFAXINE HCL ER 150 MG PO CP24: ORAL_CAPSULE | ORAL | Status: DC

## 2010-10-27 MED ORDER — BUTALBITAL-APAP-CAFFEINE 50-325-40 MG PO TABS: 1.0000 | ORAL_TABLET | Freq: Four times a day (QID) | ORAL | Status: DC | PRN

## 2010-10-27 MED ORDER — ALPRAZOLAM 2 MG PO TABS: 2.0000 mg | ORAL_TABLET | Freq: Three times a day (TID) | ORAL | Status: DC | PRN

## 2010-10-27 MED ORDER — AMPHETAMINE-DEXTROAMPHET ER 20 MG PO CP24: 20.0000 mg | ORAL_CAPSULE | ORAL | Status: DC

## 2010-11-03 ENCOUNTER — Encounter: Payer: Self-pay | Admitting: Family Medicine

## 2010-11-03 NOTE — Patient Instructions (Signed)
Recommend weight loss and as discussed try Weight Watchers Continue her other medicines as prescribed For your depression increase Effexor one b.i.d. Please schedule a mammogra m Renewed your prescription

## 2010-11-03 NOTE — Progress Notes (Signed)
  Subjective:    Patient ID: Julia Zimmerman, female    DOB: [birth year], 60 y.o.   MRN: [id number] This 60 year old white widow is in for a yearly physical examination. She relates she has continued to be somewhat depressed since her husband died 3 years ago and has been taken Effexor XR 150 once a day and after we had our discussion decided to go to one b.i.d. to help relieve her depression. He has a problem nasal congestion and had an episode of nosebleed this has subsided she continues to have episodic tension headache has continued taking Pravachol and her estrogen 0.625 q.d. to have Pap smear on this visit continues to need medications for ADD also medication for anxiety and stress using alprazolam 2 mg p.r.n. patient continues to have problem with being overweight and having a difficult time losing weightHPI    Review of Systems  Constitutional: Negative.  Negative for appetite change.  HENT: Positive for nosebleeds, congestion and postnasal drip.   Eyes: Negative.   Respiratory: Positive for cough. Negative for apnea, choking, chest tightness, shortness of breath, wheezing and stridor.   Gastrointestinal: Negative.   Genitourinary: Negative.   Musculoskeletal: Negative.   Skin: Negative.   Neurological: Negative.   Hematological: Negative.   Psychiatric/Behavioral: Positive for sleep disturbance. The patient is nervous/anxious.        Depressive reaction       Objective:   Physical Exam The patient is a well-developed well-nourished pleasant overweight white female in no acute distress HEENT reveal nasal mucosa be slightly boggy and pale one scar in left nostril from previous epistaxis Carotid pulses are good thyroid is nonpalpable Chest wall normal Heart examination normal size no murmurs Regular rhythm Lungs clear to palpation percussion and auscultation no rales heard no rhonchi no dullness no lesion Breast full firm no masses no tenderness nipples everted axilla  clear Abdomen liver spleen and kidneys are nonpalpable no masses felt bowel sounds normal slightly obese abdomen Pelvic examination reveals normal external introitus cervix vaginal mucosa negative Pap smear done uterus normal Rectal examination normal exam adnexal areas clear Extremities negative Neurological no positive findings Skin negative        Assessment & Plan:  Physical examination reveals a healthy female who is overweight Discussed weight loss diet including Weight Watchers Depression increase Effexor XRT one b.i.d. Postmenopausal syndrome continue estrogen therapy Hyperlipidemia continue Pravachol A DD continue Adderall XRT 20 Lab studies are satisfactory

## 2010-11-09 ENCOUNTER — Other Ambulatory Visit: Payer: BC Managed Care – PPO | Admitting: Family Medicine

## 2010-11-09 DIAGNOSIS — Z0000 Encounter for general adult medical examination without abnormal findings: Secondary | ICD-10-CM

## 2010-11-09 DIAGNOSIS — Z1211 Encounter for screening for malignant neoplasm of colon: Secondary | ICD-10-CM

## 2010-11-09 LAB — HEMOCCULT GUIAC POC 1CARD (OFFICE)
Card #3 Fecal Occult Blood, POC: NEGATIVE
Fecal Occult Blood, POC: NEGATIVE

## 2010-12-16 ENCOUNTER — Other Ambulatory Visit: Payer: Self-pay | Admitting: Family Medicine

## 2011-01-06 ENCOUNTER — Telehealth: Payer: Self-pay | Admitting: *Deleted

## 2011-01-06 NOTE — Telephone Encounter (Signed)
Will call results of labs and Pap which were done previously

## 2011-01-06 NOTE — Telephone Encounter (Signed)
Pt. Would like to hear from her Pap smear from last June.  Does not think she has heard anything from her labs or pap from last physical?  May leave a message on her phone.

## 2011-01-11 ENCOUNTER — Telehealth: Payer: Self-pay | Admitting: Family Medicine

## 2011-01-11 DIAGNOSIS — E785 Hyperlipidemia, unspecified: Secondary | ICD-10-CM

## 2011-01-11 NOTE — Telephone Encounter (Signed)
Pt is requesting to come in early morning last week in Sep first week in Oct for a med follow up. All open appts are for a physical is there a day or time that would be best for her to come in early morning?

## 2011-01-13 NOTE — Telephone Encounter (Signed)
Left a message for pt to call about appt and lab work.

## 2011-01-13 NOTE — Telephone Encounter (Signed)
Pt called and stated she is only available between 8-9 am.  Called and left a message for pt to make an appt for lipid panel sometime in September.  Orders have been entered.

## 2011-01-27 ENCOUNTER — Other Ambulatory Visit (INDEPENDENT_AMBULATORY_CARE_PROVIDER_SITE_OTHER): Payer: BC Managed Care – PPO

## 2011-01-27 ENCOUNTER — Other Ambulatory Visit: Payer: Self-pay | Admitting: Family Medicine

## 2011-01-27 DIAGNOSIS — E785 Hyperlipidemia, unspecified: Secondary | ICD-10-CM

## 2011-01-27 LAB — LIPID PANEL
HDL: 51.4 mg/dL (ref >39.00)
Total CHOL/HDL Ratio: 4
Triglycerides: 234 mg/dL — ABNORMAL HIGH (ref 0.0–149.0)

## 2011-01-27 LAB — LDL CHOLESTEROL, DIRECT: Direct LDL: 104 mg/dL

## 2011-02-10 ENCOUNTER — Telehealth: Payer: Self-pay | Admitting: *Deleted

## 2011-02-10 NOTE — Telephone Encounter (Signed)
Pt needs results of her last labs, and a written prescription for Crestor and generic Esgic Plus for 90 days, please.

## 2011-02-11 NOTE — Telephone Encounter (Signed)
Left a message for pt to return call. Called pt to see if there was a pharmacy her medication could be sent to instead of picking up printed scripts.

## 2011-02-12 ENCOUNTER — Telehealth: Payer: Self-pay | Admitting: *Deleted

## 2011-02-12 NOTE — Telephone Encounter (Signed)
    Pt is returning Julia Zimmerman's call 

## 2011-02-16 NOTE — Telephone Encounter (Signed)
Pt called back again today to speak with Alisha.

## 2011-02-17 NOTE — Telephone Encounter (Signed)
Spoke with pt and she stated [facility] was her mail order company. Pt is aware samples are ready for pick up.

## 2011-03-08 ENCOUNTER — Other Ambulatory Visit: Payer: Self-pay

## 2011-03-15 MED ORDER — ROSUVASTATIN CALCIUM 5 MG PO TABS: 5.0000 mg | ORAL_TABLET | Freq: Every day | ORAL | Status: DC

## 2011-03-15 MED ORDER — BUTALBITAL-APAP-CAFFEINE 50-500-40 MG PO TABS: 1.0000 | ORAL_TABLET | ORAL | Status: AC | PRN

## 2011-03-24 NOTE — Progress Notes (Signed)
Quick Note:  Pt aware ______ 

## 2011-06-21 ENCOUNTER — Telehealth: Payer: Self-pay | Admitting: Family Medicine

## 2011-06-21 MED ORDER — ALPRAZOLAM 2 MG PO TABS: 2.0000 mg | ORAL_TABLET | Freq: Three times a day (TID) | ORAL | Status: DC | PRN

## 2011-06-21 NOTE — Telephone Encounter (Signed)
Pt last seen 10/27/10.  Rx last filled 10/27/10 #270 x 1rf.  Pls adivse.  Pt has not established with new pcp.

## 2011-06-21 NOTE — Telephone Encounter (Signed)
Patient calling stating that she needs a refill on her alprazolam. Please advise and inform patient when done. Julia Zimmerman)

## 2011-06-21 NOTE — Telephone Encounter (Signed)
Rx called in [facility] per pt.  Pt is aware.

## 2011-06-21 NOTE — Telephone Encounter (Signed)
Refill for 2 weeks only.  She should already have primary provider.

## 2011-09-03 DIAGNOSIS — E785 Hyperlipidemia, unspecified: Secondary | ICD-10-CM

## 2011-09-15 ENCOUNTER — Other Ambulatory Visit: Payer: Self-pay | Admitting: Family Medicine

## 2011-10-27 ENCOUNTER — Encounter: Payer: Self-pay | Admitting: Internal Medicine

## 2011-10-27 ENCOUNTER — Ambulatory Visit (INDEPENDENT_AMBULATORY_CARE_PROVIDER_SITE_OTHER): Payer: BC Managed Care – PPO | Admitting: Internal Medicine

## 2011-10-27 ENCOUNTER — Other Ambulatory Visit (INDEPENDENT_AMBULATORY_CARE_PROVIDER_SITE_OTHER): Payer: BC Managed Care – PPO

## 2011-10-27 VITALS — BP 132/90 | HR 100 | Temp 98.5°F | Resp 16 | Ht 63.0 in | Wt 170.0 lb

## 2011-10-27 DIAGNOSIS — K219 Gastro-esophageal reflux disease without esophagitis: Secondary | ICD-10-CM

## 2011-10-27 DIAGNOSIS — E785 Hyperlipidemia, unspecified: Secondary | ICD-10-CM

## 2011-10-27 DIAGNOSIS — I10 Essential (primary) hypertension: Secondary | ICD-10-CM

## 2011-10-27 DIAGNOSIS — Z0000 Encounter for general adult medical examination without abnormal findings: Secondary | ICD-10-CM

## 2011-10-27 DIAGNOSIS — G44219 Episodic tension-type headache, not intractable: Secondary | ICD-10-CM

## 2011-10-27 DIAGNOSIS — R059 Cough, unspecified: Secondary | ICD-10-CM

## 2011-10-27 DIAGNOSIS — F988 Other specified behavioral and emotional disorders with onset usually occurring in childhood and adolescence: Secondary | ICD-10-CM

## 2011-10-27 DIAGNOSIS — F341 Dysthymic disorder: Secondary | ICD-10-CM

## 2011-10-27 DIAGNOSIS — R05 Cough: Secondary | ICD-10-CM

## 2011-10-27 DIAGNOSIS — J309 Allergic rhinitis, unspecified: Secondary | ICD-10-CM

## 2011-10-27 LAB — HEPATIC FUNCTION PANEL
Bilirubin, Direct: 0.1 mg/dL (ref 0.0–0.3)
Total Bilirubin: 0.5 mg/dL (ref 0.3–1.2)

## 2011-10-27 LAB — LIPID PANEL
LDL Cholesterol: 56 mg/dL (ref 0–99)
VLDL: 25.4 mg/dL (ref 0.0–40.0)

## 2011-10-27 LAB — COMPREHENSIVE METABOLIC PANEL
AST: 25 U/L (ref 0–37)
Albumin: 3.8 g/dL (ref 3.5–5.2)
BUN: 13 mg/dL (ref 6–23)
Calcium: 9.6 mg/dL (ref 8.4–10.5)
Chloride: 107 mEq/L (ref 96–112)
Glucose, Bld: 89 mg/dL (ref 70–99)
Potassium: 4.6 mEq/L (ref 3.5–5.1)

## 2011-10-27 MED ORDER — CLOTRIMAZOLE-BETAMETHASONE 1-0.05 % EX CREA: TOPICAL_CREAM | Freq: Two times a day (BID) | CUTANEOUS | Status: DC

## 2011-10-27 MED ORDER — ESTROGENS CONJUGATED 0.625 MG PO TABS: 0.6250 mg | ORAL_TABLET | Freq: Every day | ORAL | Status: DC

## 2011-10-27 MED ORDER — ROSUVASTATIN CALCIUM 5 MG PO TABS: 5.0000 mg | ORAL_TABLET | Freq: Every day | ORAL | Status: DC

## 2011-10-27 MED ORDER — METRONIDAZOLE 500 MG PO TABS: 500.0000 mg | ORAL_TABLET | Freq: Three times a day (TID) | ORAL | Status: DC

## 2011-10-27 MED ORDER — BUTALBITAL-APAP-CAFFEINE 50-325-40 MG PO TABS: 1.0000 | ORAL_TABLET | Freq: Four times a day (QID) | ORAL | Status: DC | PRN

## 2011-10-27 MED ORDER — AMPHETAMINE-DEXTROAMPHET ER 20 MG PO CP24: 20.0000 mg | ORAL_CAPSULE | ORAL | Status: DC

## 2011-10-27 MED ORDER — OMEGA-3 FATTY ACIDS 1000 MG PO CAPS: 2.0000 g | ORAL_CAPSULE | Freq: Every day | ORAL | Status: DC

## 2011-10-27 MED ORDER — ALPRAZOLAM 2 MG PO TABS: 2.0000 mg | ORAL_TABLET | Freq: Three times a day (TID) | ORAL | Status: DC | PRN

## 2011-10-27 MED ORDER — AZELASTINE HCL 0.1 % NA SOLN: 1.0000 | Freq: Two times a day (BID) | NASAL | Status: DC

## 2011-10-27 MED ORDER — VENLAFAXINE HCL ER 150 MG PO CP24: ORAL_CAPSULE | ORAL | Status: DC

## 2011-10-27 NOTE — Progress Notes (Signed)
Subjective:    Patient ID: Julia Zimmerman, female    DOB: [birth year], 61 y.o.   MRN: [id number]  HPI Mrs. Ode presents to establish for care having been a patient of Dr. Charmian Muff forever. She has no particular complaints today but does need refills on her medication.  Past Medical History  Diagnosis Date  . Hyperlipidemia   . GERD (gastroesophageal reflux disease)   . Depression   . Anxiety   . Learning disability     poor attention span and difficulty with retention  . ADD 10/30/2008    Qualifier: Diagnosis of  By: Alphonzo Severance MD, Loni Dolly ANXIETY DEPRESSION 03/29/2007    Qualifier: Diagnosis of  By: Alphonzo Severance MD, Loni Dolly   . Cough 04/21/2010    full eval including allergist. Working diagnosis - reflux.  . Episodic tension type headache 11/07/2007    Qualifier: Diagnosis of  By: Alphonzo Severance MD, Loni Dolly   . Essential hypertension, benign 05/15/2010    Qualifier: Diagnosis of  By: Rodena Medin MD, Acie Fredrickson   . GERD 04/21/2010    Qualifier: Diagnosis of  By: Alphonzo Severance MD, Loni Dolly   . HYPERLIPIDEMIA 05/15/2010    Qualifier: Diagnosis of  By: Rodena Medin MD, Acie Fredrickson   . ONYCHOMYCOSIS 10/30/2008    successfully treated with lamisil.  [name] RHINITIS 04/21/2010    Qualifier: Diagnosis of  By: Alphonzo Severance MD, Loni Dolly Endometriosis    Past Surgical History  Procedure Date  . Laparoscopy     x 2 for endometriosis  . Neck mass excision 1974    benign mass left  . G0p0    Family History  Problem Relation Age of Onset  . Hypertension Mother   . Diabetes Mother   . Arthritis Father     gout  . Cancer Neg Hx   . COPD Neg Hx   . Heart disease Neg Hx    History   Social History  . Marital Status: Widowed    Spouse Name: N/A    Number of Children: 0  . Years of Education: 14   Occupational History  . CUSTOMER SERVICE    Social History Main Topics  . Smoking status: Former Smoker -- 1.5 packs/day for 19 years    Types: Cigarettes    Quit date: 10/27/1990  .  Smokeless tobacco: Never Used  . Alcohol Use: No  . Drug Use: No  . Sexually Active: Not Currently   Other Topics Concern  . Not on file   Social History Narrative   HSG, 2 years [employer] in [place]. Married 1975- 34 yrs/widowed. No children. 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 Lives alone with her dogs (resucues -7), cats (3). No abuse history.      Review of Systems System review is negative for any constitutional, cardiac, pulmonary, GI or neuro symptoms or complaints other than as described in the HPI.     Objective:   Physical Exam Filed Vitals:   10/27/11 1011  BP: 132/90  Pulse: 100  Temp: 98.5 F (36.9 C)  Resp: 16   Wt Readings from Last 3 Encounters:  10/27/11 170 lb (77.111 kg)  10/27/10 178 lb (80.74 kg)  05/15/10 172 lb (78.019 kg)    Gen'l: well nourished, well developed white woman in no distress HEENT - [place]/AT, EACs/TMs normal, oropharynx with native dentition in good condition, no buccal or palatal lesions, posterior pharynx  clear, mucous membranes moist. C&S clear, PERRLA, fundi - normal Neck - supple, no thyromegaly Nodes- negative submental, cervical, supraclavicular regions Chest - no deformity, no CVAT Lungs - cleat without rales, wheezes. No increased work of breathing Breast - - Skin normal, nipples w/o discharge, no fixed mass or lesion, no axillary adenopathy. Cardiovascular - regular rate and rhythm, quiet precordium, no murmurs, rubs or gallops, 2+ radial, DP and PT pulses Abdomen - BS+ x 4, no HSM, no guarding or rebound or tenderness Pelvic - deferred to recent exam Rectal - deferred  Extremities - no clubbing, cyanosis, edema or deformity.  Neuro - A&O x 3, CN II-XII normal, motor strength normal and equal, DTRs 2+ and symmetrical biceps, radial, and patellar tendons. Cerebellar - no tremor, no rigidity, fluid movement and normal gait. Derm - Head, neck, back, abdomen and extremities without  suspicious lesions. Too much sun exposure  Lab Results  Component Value Date   WBC 5.8 10/19/2010   HGB 14.6 10/19/2010   HCT 42.7 10/19/2010   PLT 349.0 10/19/2010   GLUCOSE 89 10/27/2011   CHOL 130 10/27/2011   TRIG 127.0 10/27/2011   HDL 48.90 10/27/2011   LDLDIRECT 104.0 01/27/2011   LDLCALC 56 10/27/2011        ALT 27 10/27/2011   AST 25 10/27/2011        NA 143 10/27/2011   K 4.6 10/27/2011   CL 107 10/27/2011   CREATININE 0.7 10/27/2011   BUN 13 10/27/2011   CO2 30 10/27/2011   TSH 1.48 10/19/2010    .       Assessment & Plan:

## 2011-10-28 NOTE — Assessment & Plan Note (Signed)
Long standing diagnosis. She tolerates medication well.  Plan - refill meds

## 2011-10-28 NOTE — Assessment & Plan Note (Signed)
Very nice patient who has established for care. Her interval history is very stable. Physical exam, sans pelvic, is normal. Lab results look good. She is current with colorectal cancer screening. She is overdue for Mammogram but did have a normal breast exam.   In summary - a very nice woman who is medically stable. She is welcomed to our practice. She will return on an as needed basis.

## 2011-10-28 NOTE — Assessment & Plan Note (Signed)
Symptoms are controlled with over the counter PPI (Prilosec)

## 2011-10-28 NOTE — Assessment & Plan Note (Signed)
Excellent control with crestor. LDL at 56 is much better than goal of 100 or less. Liver functions are normal  Plan Continue present regimen

## 2011-10-28 NOTE — Assessment & Plan Note (Signed)
Stable. Her dogs are her therapy.

## 2011-10-28 NOTE — Assessment & Plan Note (Signed)
Controlled with PPI therapy

## 2011-10-28 NOTE — Assessment & Plan Note (Signed)
Stable

## 2011-10-28 NOTE — Assessment & Plan Note (Signed)
Controlled with prn use of butalbital/ASA/Caffeine

## 2011-10-28 NOTE — Assessment & Plan Note (Signed)
BP Readings from Last 3 Encounters:  10/27/11 132/90  10/27/10 118/74  05/15/10 144/100   Good control. Labs are normal.  Plan  Continue present regimen

## 2011-10-31 ENCOUNTER — Encounter: Payer: Self-pay | Admitting: Internal Medicine

## 2011-12-03 ENCOUNTER — Encounter: Payer: Self-pay | Admitting: Endocrinology

## 2011-12-03 ENCOUNTER — Ambulatory Visit (INDEPENDENT_AMBULATORY_CARE_PROVIDER_SITE_OTHER): Payer: BC Managed Care – PPO | Admitting: Endocrinology

## 2011-12-03 VITALS — BP 130/82 | HR 92 | Temp 97.3°F

## 2011-12-03 DIAGNOSIS — J209 Acute bronchitis, unspecified: Secondary | ICD-10-CM

## 2011-12-03 DIAGNOSIS — R05 Cough: Secondary | ICD-10-CM

## 2011-12-03 MED ORDER — TRAMADOL HCL 50 MG PO TABS: 50.0000 mg | ORAL_TABLET | ORAL | Status: AC | PRN

## 2011-12-03 MED ORDER — CEFUROXIME AXETIL 250 MG PO TABS: 250.0000 mg | ORAL_TABLET | Freq: Two times a day (BID) | ORAL | Status: AC

## 2011-12-03 NOTE — Patient Instructions (Addendum)
i have sent 2 prescriptions to your pharmacy: for an antibiotic pill, and cough pills. A chest-x-ray is requested for you today.  You will receive a letter with results. I hope you feel better soon.  If you don't feel better by next week, please call back.  Please call sooner if you get worse.

## 2011-12-03 NOTE — Progress Notes (Signed)
Subjective:    Patient ID: Julia Zimmerman, female    DOB: 1950/07/08, 61 y.o.   MRN: [id number]  HPI Pt states few weeks of slightly prod-quality cough in the chest, and assoc headache.  She was seen at urgent care on 11/18/11 for bronchitis.  She was rx'ed with abx and prednisone, and got only temporary relief.   Past Medical History  Diagnosis Date  . Hyperlipidemia   . GERD (gastroesophageal reflux disease)   . Depression   . Anxiety   . Learning disability     poor attention span and difficulty with retention  . ADD 10/30/2008    Qualifier: Diagnosis of  By: Alphonzo Severance MD, Loni Dolly ANXIETY DEPRESSION 03/29/2007    Qualifier: Diagnosis of  By: Alphonzo Severance MD, Loni Dolly   . Cough 04/21/2010    full eval including allergist. Working diagnosis - reflux.  . Episodic tension type headache 11/07/2007    Qualifier: Diagnosis of  By: Alphonzo Severance MD, Loni Dolly   . Essential hypertension, benign 05/15/2010    Qualifier: Diagnosis of  By: Rodena Medin MD, Acie Fredrickson   . GERD 04/21/2010    Qualifier: Diagnosis of  By: Alphonzo Severance MD, Loni Dolly   . HYPERLIPIDEMIA 05/15/2010    Qualifier: Diagnosis of  By: Rodena Medin MD, Acie Fredrickson   . ONYCHOMYCOSIS 10/30/2008    successfully treated with lamisil.  [name] RHINITIS 04/21/2010    Qualifier: Diagnosis of  By: Alphonzo Severance MD, Loni Dolly Endometriosis     Past Surgical History  Procedure Date  . Laparoscopy     x 2 for endometriosis  . Neck mass excision 1974    benign mass left  . G0p0     History   Social History  . Marital Status: Widowed    Spouse Name: N/A    Number of Children: 0  . Years of Education: 14   Occupational History  . CUSTOMER SERVICE    Social History Main Topics  . Smoking status: Former Smoker -- 1.5 packs/day for 19 years    Types: Cigarettes    Quit date: 10/27/1990  . Smokeless tobacco: Never Used  . Alcohol Use: No  . Drug Use: No  . Sexually Active: Not Currently   Other Topics Concern  . Not on file   Social  History Narrative   HSG, 2 years [employer] in [place]. Married 1975- 34 yrs/widowed. No children. 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 Lives alone with her dogs (resucues -7), cats (3). No abuse history.     Current Outpatient Prescriptions on File Prior to Visit  Medication Sig Dispense Refill  . alprazolam (XANAX) 2 MG tablet Take 1 tablet (2 mg total) by mouth 3 (three) times daily as needed.  90 tablet  3  . amphetamine-dextroamphetamine (ADDERALL XR) 20 MG 24 hr capsule Take 1 capsule (20 mg total) by mouth every morning. For ADD  90 capsule  0  . butalbital-acetaminophen-caffeine (FIORICET, ESGIC) 50-325-40 MG per tablet Take 1 tablet by mouth every 6 (six) hours as needed for headache.  90 tablet  3  . Calcium-Vitamin D 500-125 MG-UNIT TABS Take 1 tablet by mouth daily.        . clotrimazole-betamethasone (LOTRISONE) cream Apply topically 2 (two) times daily.  30 g  3  . CYPROHEPTADINE HCL PO Take 4 mg by mouth 3 (three) times daily.        [name]  estrogens, conjugated, (PREMARIN) 0.625 MG tablet Take 1 tablet (0.625 mg total) by mouth daily. Take daily for 21 days then do not take for 7 days.  90 tablet  3  . fish oil-omega-3 fatty acids 1000 MG capsule Take 2 capsules (2 g total) by mouth daily.  90 capsule  3  . rosuvastatin (CRESTOR) 5 MG tablet Take 1 tablet (5 mg total) by mouth daily.  90 tablet  3  . venlafaxine XR (EFFEXOR-XR) 150 MG 24 hr capsule 1 cap bid for depression  180 capsule  3    Allergies  Allergen Reactions  . Sulfamethoxazole     REACTION: unspecified    Family History  Problem Relation Age of Onset  . Hypertension Mother   . Diabetes Mother   . Arthritis Father     gout  . Cancer Neg Hx   . COPD Neg Hx   . Heart disease Neg Hx     BP 130/82  Pulse 92  Temp 97.3 F (36.3 C) (Oral)  SpO2 94%    Review of Systems She also has nasal congestion, but no wheezing.  She is unaware of any fever.      Objective:    Physical Exam VITAL SIGNS:  See vs page GENERAL: no distress head: no deformity eyes: no periorbital swelling, no proptosis external nose and ears are normal mouth: no lesion seen Both tm's are congested, and slightly red NECK: There is no palpable thyroid enlargement.  No thyroid nodule is palpable.  No palpable lymphadenopathy at the anterior neck. LUNGS:  Clear to auscultation.         Assessment & Plan:  Acute bronchitis, persistent

## 2011-12-04 DIAGNOSIS — J209 Acute bronchitis, unspecified: Secondary | ICD-10-CM | POA: Insufficient documentation

## 2012-03-02 ENCOUNTER — Encounter: Payer: Self-pay | Admitting: Gastroenterology

## 2012-04-28 ENCOUNTER — Telehealth: Payer: Self-pay | Admitting: Internal Medicine

## 2012-04-28 MED ORDER — ALPRAZOLAM 2 MG PO TABS: 2.0000 mg | ORAL_TABLET | Freq: Three times a day (TID) | ORAL | Status: DC | PRN

## 2012-04-28 NOTE — Telephone Encounter (Signed)
Rx done and signed. Can someone fax it to [facility]?

## 2012-04-28 NOTE — Telephone Encounter (Signed)
[facility] will not refill Alprazolom.  They need new rx.

## 2012-05-31 ENCOUNTER — Other Ambulatory Visit: Payer: Self-pay | Admitting: Internal Medicine

## 2012-06-01 ENCOUNTER — Other Ambulatory Visit: Payer: Self-pay | Admitting: *Deleted

## 2012-06-01 MED ORDER — BUTALBITAL-APAP-CAFFEINE 50-325-40 MG PO TABS: 1.0000 | ORAL_TABLET | Freq: Four times a day (QID) | ORAL | Status: DC | PRN

## 2012-09-15 ENCOUNTER — Telehealth: Payer: Self-pay | Admitting: *Deleted

## 2012-09-15 MED ORDER — ALPRAZOLAM 2 MG PO TABS: 2.0000 mg | ORAL_TABLET | Freq: Three times a day (TID) | ORAL | Status: DC | PRN

## 2012-09-15 NOTE — Telephone Encounter (Signed)
Done hardcopy to robin  

## 2012-09-15 NOTE — Telephone Encounter (Signed)
Pt requesting refill of Alprazolam to be sent to [facility]-last written 04/28/2012 #90 with 3 refills. Pt has appointment with [facility] on 11/22/2012-please advise on refill.

## 2012-09-15 NOTE — Telephone Encounter (Signed)
Called pt no answer [facility]OM rx [facility] to [facility].../lmb 

## 2012-11-04 ENCOUNTER — Other Ambulatory Visit: Payer: Self-pay | Admitting: Internal Medicine

## 2012-11-22 ENCOUNTER — Ambulatory Visit (INDEPENDENT_AMBULATORY_CARE_PROVIDER_SITE_OTHER): Payer: BC Managed Care – PPO | Admitting: Internal Medicine

## 2012-11-22 ENCOUNTER — Other Ambulatory Visit (INDEPENDENT_AMBULATORY_CARE_PROVIDER_SITE_OTHER): Payer: BC Managed Care – PPO

## 2012-11-22 ENCOUNTER — Encounter: Payer: Self-pay | Admitting: Internal Medicine

## 2012-11-22 ENCOUNTER — Other Ambulatory Visit (HOSPITAL_COMMUNITY)
Admission: RE | Admit: 2012-11-22 | Discharge: 2012-11-22 | Disposition: A | Discharge status: Home / Self Care | Payer: BC Managed Care – PPO | Source: Ambulatory Visit | Attending: Internal Medicine | Admitting: Internal Medicine

## 2012-11-22 VITALS — BP 142/84 | HR 72 | Temp 98.0°F | Ht 64.0 in | Wt 171.0 lb

## 2012-11-22 DIAGNOSIS — I10 Essential (primary) hypertension: Secondary | ICD-10-CM

## 2012-11-22 DIAGNOSIS — Z01419 Encounter for gynecological examination (general) (routine) without abnormal findings: Secondary | ICD-10-CM | POA: Insufficient documentation

## 2012-11-22 DIAGNOSIS — E785 Hyperlipidemia, unspecified: Secondary | ICD-10-CM

## 2012-11-22 DIAGNOSIS — R198 Other specified symptoms and signs involving the digestive system and abdomen: Secondary | ICD-10-CM

## 2012-11-22 DIAGNOSIS — R1900 Intra-abdominal and pelvic swelling, mass and lump, unspecified site: Secondary | ICD-10-CM

## 2012-11-22 DIAGNOSIS — Z23 Encounter for immunization: Secondary | ICD-10-CM

## 2012-11-22 DIAGNOSIS — Z124 Encounter for screening for malignant neoplasm of cervix: Secondary | ICD-10-CM

## 2012-11-22 DIAGNOSIS — Z0000 Encounter for general adult medical examination without abnormal findings: Secondary | ICD-10-CM

## 2012-11-22 LAB — COMPREHENSIVE METABOLIC PANEL
ALT: 28 U/L (ref 0–35)
AST: 24 U/L (ref 0–37)
Alkaline Phosphatase: 114 U/L (ref 39–117)
Creatinine, Ser: 0.6 mg/dL (ref 0.4–1.2)
Total Bilirubin: 0.3 mg/dL (ref 0.3–1.2)

## 2012-11-22 LAB — HEPATIC FUNCTION PANEL
ALT: 28 U/L (ref 0–35)
Albumin: 4 g/dL (ref 3.5–5.2)
Alkaline Phosphatase: 114 U/L (ref 39–117)
Bilirubin, Direct: 0.1 mg/dL (ref 0.0–0.3)
Total Protein: 7.3 g/dL (ref 6.0–8.3)

## 2012-11-22 LAB — LIPID PANEL
Cholesterol: 147 mg/dL (ref 0–200)
LDL Cholesterol: 71 mg/dL (ref 0–99)
Total CHOL/HDL Ratio: 3

## 2012-11-22 NOTE — Progress Notes (Signed)
Subjective:    Patient ID: Julia Zimmerman, female    DOB: 14-Apr-1951, 62 y.o.   MRN: [id number]  HPI Julia Zimmerman presents for a well woman exam. She has been in generally good health: She does have headaches and continue anxiety/depression, GERD, ADD. She does report a concern for ovarian cancer with two aunts having had breast cancer and her increasing abdominal girth.   She is current with Dentist and eye doctor. She has joined [employer]. She has started a dance class.   Past Medical History  Diagnosis Date  . Hyperlipidemia   . GERD (gastroesophageal reflux disease)   . Depression   . Anxiety   . Learning disability     poor attention span and difficulty with retention  . ADD 10/30/2008    Qualifier: Diagnosis of  By: Alphonzo Severance MD, Loni Dolly ANXIETY DEPRESSION 03/29/2007    Qualifier: Diagnosis of  By: Alphonzo Severance MD, Loni Dolly   . Cough 04/21/2010    full eval including allergist. Working diagnosis - reflux.  . Episodic tension type headache 11/07/2007    Qualifier: Diagnosis of  By: Alphonzo Severance MD, Loni Dolly   . Essential hypertension, benign 05/15/2010    Qualifier: Diagnosis of  By: Rodena Medin MD, Acie Fredrickson   . GERD 04/21/2010    Qualifier: Diagnosis of  By: Alphonzo Severance MD, Loni Dolly   . HYPERLIPIDEMIA 05/15/2010    Qualifier: Diagnosis of  By: Rodena Medin MD, Acie Fredrickson   . ONYCHOMYCOSIS 10/30/2008    successfully treated with lamisil.  [name] RHINITIS 04/21/2010    Qualifier: Diagnosis of  By: Alphonzo Severance MD, Loni Dolly Endometriosis    Past Surgical History  Procedure Laterality Date  . Laparoscopy      x 2 for endometriosis  . Neck mass excision  1974    benign mass left  . G0p0     Family History  Problem Relation Age of Onset  . Hypertension Mother   . Diabetes Mother   . Arthritis Father     gout  . Cancer Neg Hx   . COPD Neg Hx   . Heart disease Neg Hx    History   Social History  . Marital Status: Widowed    Spouse Name: N/A    Number of Children: 0   . Years of Education: 14   Occupational History  . CUSTOMER SERVICE    Social History Main Topics  . Smoking status: Former Smoker -- 1.50 packs/day for 19 years    Types: Cigarettes    Quit date: 10/27/1990  . Smokeless tobacco: Never Used  . Alcohol Use: No  . Drug Use: No  . Sexually Active: Not Currently   Other Topics Concern  . Not on file   Social History Narrative   HSG, 2 years [employer] in [name]. Married 1975- 34 yrs/widowed. No children. 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 Lives alone with her dogs (resucues -7), cats (3). No abuse history.     Current Outpatient Prescriptions on File Prior to Visit  Medication Sig Dispense Refill  . alprazolam (XANAX) 2 MG tablet Take 1 tablet (2 mg total) by mouth 3 (three) times daily as needed.  90 tablet  3  . butalbital-acetaminophen-caffeine (FIORICET, ESGIC) 50-325-40 MG per tablet Take 1 tablet by mouth every 6 (six) hours as needed for headache.  90 tablet  3  . Calcium-Vitamin D 500-125 MG-UNIT  TABS Take 1 tablet by mouth daily.        . clotrimazole-betamethasone (LOTRISONE) cream Apply topically 2 (two) times daily.  30 g  3  . CRESTOR 5 MG tablet TAKE 1 TABLET DAILY  90 tablet  2  . CYPROHEPTADINE HCL PO Take 4 mg by mouth 3 (three) times daily.        . fish oil-omega-3 fatty acids 1000 MG capsule Take 2 capsules (2 g total) by mouth daily.  90 capsule  3  . venlafaxine XR (EFFEXOR-XR) 150 MG 24 hr capsule 1 cap bid for depression  180 capsule  3  . amphetamine-dextroamphetamine (ADDERALL XR) 20 MG 24 hr capsule Take 1 capsule (20 mg total) by mouth every morning. For ADD  90 capsule  0   No current facility-administered medications on file prior to visit.     Review of Systems Constitutional:  Negative for fever, chills, activity change and unexpected weight change.  HEENT:  Negative for hearing loss but complains of fullness and stuffy sensation, ear pain, congestion, neck  stiffness and postnasal drip. Negative for sore throat or swallowing problems. Negative for dental complaints.   Eyes: Negative for vision loss or change in visual acuity.  Respiratory: Negative for chest tightness and wheezing. Negative for DOE.   Cardiovascular: Negative for chest pain or palpitations. No decreased exercise tolerance Gastrointestinal: No change in bowel habit. No bloating or gas. No reflux or indigestion Genitourinary: Negative for urgency, frequency, flank pain and difficulty urinating.  Musculoskeletal: Negative for myalgias, back pain, arthralgias and gait problem.  Neurological: Negative for dizziness, tremors, weakness and headaches.  Hematological: Negative for adenopathy.  Psychiatric/Behavioral: Negative for behavioral problems and dysphoric mood.       Objective:   Physical Exam Filed Vitals:   11/22/12 1503  BP: 142/84  Pulse: 72  Temp: 98 F (36.7 C)   Wt Readings from Last 3 Encounters:  11/22/12 171 lb (77.565 kg)  10/27/11 170 lb (77.111 kg)  10/27/10 178 lb (80.74 kg)   Gen'l: well nourished, well developed, overweight Woman in no distress HEENT - [place]/AT, EACs/TMs normal, oropharynx with native dentition in good condition, no buccal or palatal lesions, posterior pharynx clear, mucous membranes moist. C&S clear, PERRLA, fundi - normal Neck - supple, no thyromegaly Nodes- negative submental, cervical, supraclavicular regions Chest - no deformity, no CVAT Lungs - clear without rales, wheezes. No increased work of breathing Breast - - Skin normal, nipples w/o discharge, no fixed mass or lesion, no axillary adenopathy. Cardiovascular - regular rate and rhythm, quiet precordium, no murmurs, rubs or gallops, 2+ radial, DP and PT pulses Abdomen - BS+ x 4, no HSM, no guarding or rebound or tenderness Pelvic - NEG/BUS normal, vaginal mucosa normal, scant amount of d/c in posterior fornix. Nulliparous cervix, PAP with cytobrush performed. Rectal - deferred   Extremities - no clubbing, cyanosis, edema or deformity.  Neuro - A&O x 3, CN II-XII normal, motor strength normal and equal, DTRs 2+ and symmetrical biceps, radial, and patellar tendons. Cerebellar - no tremor, no rigidity, fluid movement and normal gait. Derm - Head, neck, back, abdomen and extremities without suspicious lesions  Recent Results (from the past 2160 hour(s))  HEPATIC FUNCTION PANEL     Status: None   Collection Time    11/22/12  4:16 PM      Result Value Range   Total Bilirubin 0.3  0.3 - 1.2 mg/dL   Bilirubin, Direct 0.1  0.0 - 0.3 mg/dL  Alkaline Phosphatase 114  39 - 117 U/L   AST 24  0 - 37 U/L   ALT 28  0 - 35 U/L   Total Protein 7.3  6.0 - 8.3 g/dL   Albumin 4.0  3.5 - 5.2 g/dL  TSH     Status: None   Collection Time    11/22/12  4:16 PM      Result Value Range   TSH 1.18  0.35 - 5.50 uIU/mL  LIPID PANEL     Status: None   Collection Time    11/22/12  4:16 PM      Result Value Range   Cholesterol 147  0 - 200 mg/dL   Comment: ATP III Classification       Desirable:  < 200 mg/dL               Borderline High:  200 - 239 mg/dL          High:  > = 409 mg/dL   Triglycerides 811.9  0.0 - 149.0 mg/dL   Comment: Normal:  <147 mg/dLBorderline High:  150 - 199 mg/dL   HDL 82.95  >62.13 mg/dL   VLDL 08.6  0.0 - 57.8 mg/dL   LDL Cholesterol 71  0 - 99 mg/dL   Total CHOL/HDL Ratio 3     Comment:                Men          Women1/2 Average Risk     3.4          3.3Average Risk          5.0          4.42X Average Risk          9.6          7.13X Average Risk          15.0          11.0                      COMPREHENSIVE METABOLIC PANEL     Status: None   Collection Time    11/22/12  4:16 PM      Result Value Range   Sodium 140  135 - 145 mEq/L   Potassium 4.3  3.5 - 5.1 mEq/L   Chloride 102  96 - 112 mEq/L   CO2 29  19 - 32 mEq/L   Glucose, Bld 92  70 - 99 mg/dL   BUN 13  6 - 23 mg/dL   Creatinine, Ser 0.6  0.4 - 1.2 mg/dL   Total Bilirubin 0.3  0.3 - 1.2  mg/dL   Alkaline Phosphatase 114  39 - 117 U/L   AST 24  0 - 37 U/L   ALT 28  0 - 35 U/L   Total Protein 7.3  6.0 - 8.3 g/dL   Albumin 4.0  3.5 - 5.2 g/dL   Calcium 9.4  8.4 - 46.9 mg/dL   GFR 629.52  >84.13 mL/min  HEMOGLOBIN AND HEMATOCRIT, BLOOD     Status: Abnormal   Collection Time    11/22/12  4:16 PM      Result Value Range   Hemoglobin 15.1 (*) 12.0 - 15.0 g/dL   HCT 24.4  01.0 - 27.2 %         Assessment & Plan:

## 2012-11-22 NOTE — Patient Instructions (Addendum)
Thanks for coming to see me.  Your exam is normal except for weight, including clear ear canals. You may want to try sudafed (generic) 30 mg twice a day to help with full feeling.  Due to concern for ovarian cancer and a family history of breast cancer will schedule trans-vaginal ultrasound. You will be called with date and time.  For routine lab today to check cholesterol, liver, kidney, sugar, etc. Results will be available on MyChart.  Health maintenance - will give pneumonia vaccine today. You need to check on insurance coverage for shingles vaccine.  Sleep:  Sleep is a learned or unlearned behavior. 5 principles of sleep hygiene - 1) regular hour to retire and rise 7days/wk 2) no stimulants - caffeine, chocolat, alcohol, 3) regular exercise  - every afternoon  4) sleep sanctuary - a space that is right light, temperature, sound level, good bed where all you do is sleep. 5) No extinction behaviors, e.g. Laying in bed awake doing anything but sleeping. This means if you have a bad night - no naps, etc Medications can be added if good hygiene fails, e.g. ambien 5 mg at bedtime every other or third night as needed.

## 2012-11-24 ENCOUNTER — Telehealth: Payer: Self-pay

## 2012-11-24 DIAGNOSIS — R198 Other specified symptoms and signs involving the digestive system and abdomen: Secondary | ICD-10-CM

## 2012-11-24 NOTE — Telephone Encounter (Signed)
Phone call from [place] at [facility] needing an order for u/s pelvis to be placed along with the existing order u/s transvaginal. This was done.

## 2012-11-26 NOTE — Assessment & Plan Note (Signed)
BP Readings from Last 3 Encounters:  11/22/12 142/84  12/03/11 130/82  10/27/11 132/90   On no meds and BP is OK per JNC 8 guidelines.

## 2012-11-26 NOTE — Assessment & Plan Note (Signed)
Interval history negative for any major illness, injury or surgery. Complete  physical exam is normal. Lab results are in normal range. She is current with colorectal cancer screening. She has a concern for ovarian cancer risk due to family h/o breast cancer - will be scheduled for transvaginal ultra-sound. Normal breast exam and due for mammogram. Immunizations current except for shingle vaccine -she will check insurance coverage. Discussed sleep issues focusing on adequate sleep hygiene.  In summary - a nice woman who appears medically stable. She will return in 1 year or sooner as needed.

## 2012-11-26 NOTE — Assessment & Plan Note (Signed)
Taking and tolerating medication. Lipid panel with LDL better than high risk goal of 80 or less. Liver functions are normal.  Plan - continue present medication

## 2012-12-01 ENCOUNTER — Ambulatory Visit
Admission: RE | Admit: 2012-12-01 | Discharge: 2012-12-01 | Disposition: A | Discharge status: Home / Self Care | Payer: BC Managed Care – PPO | Source: Ambulatory Visit | Attending: Internal Medicine | Admitting: Internal Medicine

## 2012-12-01 DIAGNOSIS — R198 Other specified symptoms and signs involving the digestive system and abdomen: Secondary | ICD-10-CM

## 2012-12-04 ENCOUNTER — Encounter: Payer: Self-pay | Admitting: Internal Medicine

## 2012-12-04 MED ORDER — BUTALBITAL-APAP-CAFFEINE 50-325-40 MG PO TABS: 1.0000 | ORAL_TABLET | Freq: Four times a day (QID) | ORAL | Status: DC | PRN

## 2012-12-04 MED ORDER — CYPROHEPTADINE HCL 4 MG PO TABS: 4.0000 mg | ORAL_TABLET | Freq: Three times a day (TID) | ORAL | Status: DC

## 2012-12-28 ENCOUNTER — Encounter: Payer: Self-pay | Admitting: Gastroenterology

## 2013-03-06 ENCOUNTER — Other Ambulatory Visit: Payer: Self-pay | Admitting: Internal Medicine

## 2013-03-06 NOTE — Telephone Encounter (Signed)
error 

## 2013-03-08 ENCOUNTER — Other Ambulatory Visit: Payer: Self-pay

## 2013-03-09 ENCOUNTER — Encounter: Payer: Self-pay | Admitting: Gastroenterology

## 2013-04-04 MED ORDER — ALPRAZOLAM 2 MG PO TABS: 2.0000 mg | ORAL_TABLET | Freq: Three times a day (TID) | ORAL | Status: DC | PRN

## 2013-04-04 NOTE — Addendum Note (Signed)
Addended by: Lyanne Co R on: 04/04/2013 01:59 PM   Modules accepted: Orders

## 2013-04-04 NOTE — Telephone Encounter (Signed)
Script has been printed and will be faxed to [facility]

## 2013-04-06 ENCOUNTER — Encounter: Payer: Self-pay | Admitting: Internal Medicine

## 2013-04-10 ENCOUNTER — Ambulatory Visit (AMBULATORY_SURGERY_CENTER): Payer: Self-pay | Admitting: *Deleted

## 2013-04-10 VITALS — Ht 63.5 in | Wt 165.6 lb

## 2013-04-10 DIAGNOSIS — Z86010 Personal history of colonic polyps: Secondary | ICD-10-CM

## 2013-04-10 MED ORDER — MOVIPREP 100 G PO SOLR: ORAL | Status: DC

## 2013-04-10 NOTE — Progress Notes (Signed)
No allergies to eggs or soy. No problems with anesthesia.  

## 2013-04-11 ENCOUNTER — Encounter: Payer: Self-pay | Admitting: Gastroenterology

## 2013-04-24 ENCOUNTER — Ambulatory Visit (AMBULATORY_SURGERY_CENTER): Payer: BC Managed Care – PPO | Admitting: Gastroenterology

## 2013-04-24 ENCOUNTER — Encounter: Payer: Self-pay | Admitting: Gastroenterology

## 2013-04-24 VITALS — BP 157/94 | HR 62 | Temp 97.8°F | Resp 25 | Ht 63.0 in | Wt 165.0 lb

## 2013-04-24 DIAGNOSIS — Z86010 Personal history of colonic polyps: Secondary | ICD-10-CM

## 2013-04-24 DIAGNOSIS — D126 Benign neoplasm of colon, unspecified: Secondary | ICD-10-CM

## 2013-04-24 MED ORDER — SODIUM CHLORIDE 0.9 % IV SOLN: 500.0000 mL | INTRAVENOUS | Status: DC

## 2013-04-24 NOTE — Patient Instructions (Signed)

## 2013-04-24 NOTE — Progress Notes (Signed)
Lidocaine-40mg IV prior to Propofol InductionPropofol given over incremental dosages 

## 2013-04-24 NOTE — Progress Notes (Signed)
Called to room to assist during endoscopic procedure.  Patient ID and intended procedure confirmed with present staff. Received instructions for my participation in the procedure from the performing physician.  

## 2013-04-24 NOTE — Op Note (Signed)
[facility] [address] [place], [address]   COLONOSCOPY PROCEDURE REPORT PATIENT: Julia Zimmerman, Julia Zimmerman  MR#: [id number] BIRTHDATE: April 12, 1951 , 62  yrs. old GENDER: Female ENDOSCOPIST: Meryl Dare, MD, [facility] PROCEDURE DATE:  04/24/2013 PROCEDURE:   Colonoscopy with biopsy First Screening Colonoscopy - Avg.  risk and is 50 yrs.  old or older - No.  Prior Negative Screening - Now for repeat screening. N/A  History of Adenoma - Now for follow-up colonoscopy & has been > or = to 3 yrs.  Yes hx of adenoma.  Has been 3 or more years since last colonoscopy.  Polyps Removed Today? Yes. ASA CLASS:   Class II INDICATIONS:Patient's personal history of adenomatous colon polyps.  MEDICATIONS: MAC sedation, administered by CRNA and propofol (Diprivan) 200mg  IV DESCRIPTION OF PROCEDURE:   After the risks benefits and alternatives of the procedure were thoroughly explained, informed consent was obtained.  A digital rectal exam revealed no abnormalities of the rectum.   The LB [id number]  endoscope was introduced through the anus and advanced to the cecum, which was identified by both the appendix and ileocecal valve. No adverse events experienced.   The quality of the prep was good, using MoviPrep  The instrument was then slowly withdrawn as the colon was fully examined.  COLON FINDINGS: Two sessile polyps measuring 3 mm in size were found in the ascending colon.  A polypectomy was performed with cold forceps.  The resection was complete and the polyp tissue was completely retrieved.   Mild diverticulosis was noted in the descending colon and sigmoid colon.   Small lipoma, 1 cm,  was found in the transverse colon.  The colon was otherwise normal. There was no diverticulosis, inflammation, polyps or cancers unless previously stated.  Retroflexed views revealed small internal hemorrhoids. The time to cecum=1 minutes 45 seconds.  Withdrawal time=8 minutes 50 seconds.   The scope was withdrawn and the procedure completed. COMPLICATIONS: There were no complications.  ENDOSCOPIC IMPRESSION: 1.   Two sessile polyps measuring 3 mm in the ascending colon; polypectomy performed with cold forceps 2.   Mild diverticulosis in the descending colon and sigmoid colon 3.   Lipoma in the transverse colon 4.   Small internal hemorrhoids  RECOMMENDATIONS: 1.  Await pathology results 2.  High fiber diet with liberal fluid intake. 3.  Repeat Colonoscopy in 5 years.  eSigned:  Meryl Dare, MD, [facility] 04/24/2013 2:36 PM

## 2013-04-25 ENCOUNTER — Telehealth: Payer: Self-pay | Admitting: *Deleted

## 2013-04-25 NOTE — Telephone Encounter (Signed)
  Follow up Call-  Call back number 04/24/2013  Post procedure Call Back phone  # [phone]  Permission to leave phone message Yes     Patient questions:  Do you have a fever, pain , or abdominal swelling? no Pain Score  0 *  Have you tolerated food without any problems? yes  Have you been able to return to your normal activities? yes  Do you have any questions about your discharge instructions: Diet   no Medications  no Follow up visit  no  Do you have questions or concerns about your Care? no  Actions: * If pain score is 4 or above: No action needed, pain <4.

## 2013-05-03 ENCOUNTER — Encounter: Payer: Self-pay | Admitting: Gastroenterology

## 2013-05-07 ENCOUNTER — Other Ambulatory Visit: Payer: Self-pay

## 2013-05-07 MED ORDER — BUTALBITAL-APAP-CAFFEINE 50-325-40 MG PO TABS: 1.0000 | ORAL_TABLET | Freq: Four times a day (QID) | ORAL | Status: DC | PRN

## 2013-05-07 NOTE — Telephone Encounter (Signed)
[facility]

## 2013-07-11 ENCOUNTER — Other Ambulatory Visit: Payer: Self-pay | Admitting: Internal Medicine

## 2013-08-10 ENCOUNTER — Other Ambulatory Visit: Payer: Self-pay | Admitting: Internal Medicine

## 2013-08-23 ENCOUNTER — Telehealth: Payer: Self-pay | Admitting: Internal Medicine

## 2013-09-07 ENCOUNTER — Encounter: Payer: Self-pay | Admitting: Internal Medicine

## 2013-09-07 NOTE — Telephone Encounter (Signed)
Needs OV w/her new PCP Thx

## 2013-09-10 NOTE — Telephone Encounter (Signed)
Will have [place] contact pt to schedule OV.

## 2013-09-11 ENCOUNTER — Other Ambulatory Visit: Payer: Self-pay | Admitting: *Deleted

## 2013-09-11 MED ORDER — ALPRAZOLAM 2 MG PO TABS: 2.0000 mg | ORAL_TABLET | Freq: Three times a day (TID) | ORAL | Status: DC | PRN

## 2013-09-17 ENCOUNTER — Ambulatory Visit (INDEPENDENT_AMBULATORY_CARE_PROVIDER_SITE_OTHER): Payer: BC Managed Care – PPO | Admitting: Internal Medicine

## 2013-09-17 ENCOUNTER — Encounter: Payer: Self-pay | Admitting: Internal Medicine

## 2013-09-17 VITALS — BP 140/90 | HR 76 | Temp 98.5°F | Resp 16 | Wt 174.0 lb

## 2013-09-17 DIAGNOSIS — E669 Obesity, unspecified: Secondary | ICD-10-CM

## 2013-09-17 DIAGNOSIS — I10 Essential (primary) hypertension: Secondary | ICD-10-CM

## 2013-09-17 DIAGNOSIS — F341 Dysthymic disorder: Secondary | ICD-10-CM

## 2013-09-17 DIAGNOSIS — F988 Other specified behavioral and emotional disorders with onset usually occurring in childhood and adolescence: Secondary | ICD-10-CM

## 2013-09-17 DIAGNOSIS — E785 Hyperlipidemia, unspecified: Secondary | ICD-10-CM

## 2013-09-17 DIAGNOSIS — G44219 Episodic tension-type headache, not intractable: Secondary | ICD-10-CM

## 2013-09-17 MED ORDER — LORCASERIN HCL 10 MG PO TABS: 1.0000 | ORAL_TABLET | Freq: Two times a day (BID) | ORAL | Status: DC

## 2013-09-17 MED ORDER — VITAMIN D 1000 UNITS PO TABS: 1000.0000 [IU] | ORAL_TABLET | Freq: Every day | ORAL | Status: AC

## 2013-09-17 NOTE — Assessment & Plan Note (Signed)
Not on Rx 

## 2013-09-17 NOTE — Assessment & Plan Note (Signed)
Wt watchers was tried before Can try Z plan Started Belviq   Potential benefits of a long term Belviq use as well as potential risks  and complications were explained to the patient and were aknowledged.

## 2013-09-17 NOTE — Progress Notes (Signed)
   Subjective:     HPI  New pt - transfer from Dr MEN C/o wt gain  The patient is here to follow up on chronic depression, anxiety, headaches and insomnia controlled with medicines   Wt Readings from Last 3 Encounters:  09/17/13 174 lb (78.926 kg)  04/24/13 165 lb (74.844 kg)  04/10/13 165 lb 9.6 oz (75.116 kg)   BP Readings from Last 3 Encounters:  09/17/13 140/90  04/24/13 157/94  11/22/12 142/84      Review of Systems  Constitutional: Positive for unexpected weight change. Negative for chills, activity change, appetite change and fatigue.  HENT: Negative for congestion, mouth sores and sinus pressure.   Eyes: Negative for visual disturbance.  Respiratory: Negative for cough and chest tightness.   Gastrointestinal: Negative for nausea and abdominal pain.  Genitourinary: Negative for frequency, difficulty urinating and vaginal pain.  Musculoskeletal: Positive for arthralgias. Negative for back pain and gait problem.  Skin: Negative for pallor and rash.  Neurological: Negative for dizziness, tremors, weakness, numbness and headaches.  Psychiatric/Behavioral: Negative for confusion and sleep disturbance.       Objective:   Physical Exam  Constitutional: She appears well-developed. No distress.  Obese NAD   HENT:  Head: Normocephalic.  Right Ear: External ear normal.  Left Ear: External ear normal.  Nose: Nose normal.  Mouth/Throat: Oropharynx is clear and moist.  Eyes: Conjunctivae are normal. Pupils are equal, round, and reactive to light. Right eye exhibits no discharge. Left eye exhibits no discharge.  Neck: Normal range of motion. Neck supple. No JVD present. No tracheal deviation present. No thyromegaly present.  Cardiovascular: Normal rate, regular rhythm and normal heart sounds.   Pulmonary/Chest: No stridor. No respiratory distress. She has no wheezes.  Abdominal: Soft. Bowel sounds are normal. She exhibits no distension and no mass. There is no  tenderness. There is no rebound and no guarding.  Musculoskeletal: She exhibits no edema and no tenderness.  Lymphadenopathy:    She has no cervical adenopathy.  Neurological: She displays normal reflexes. No cranial nerve deficit. She exhibits normal muscle tone. Coordination normal.  Skin: No rash noted. No erythema.  Psychiatric: She has a normal mood and affect. Her behavior is normal. Judgment and thought content normal.   Lab Results  Component Value Date   WBC 5.8 10/19/2010   HGB 15.1* 11/22/2012   HCT 44.1 11/22/2012   PLT 349.0 10/19/2010   GLUCOSE 92 11/22/2012   CHOL 147 11/22/2012   TRIG 137.0 11/22/2012   HDL 48.80 11/22/2012   LDLDIRECT 104.0 01/27/2011   LDLCALC 71 11/22/2012   ALT 28 11/22/2012   ALT 28 11/22/2012   AST 24 11/22/2012   AST 24 11/22/2012   NA 140 11/22/2012   K 4.3 11/22/2012   CL 102 11/22/2012   CREATININE 0.6 11/22/2012   BUN 13 11/22/2012   CO2 29 11/22/2012   TSH 1.18 11/22/2012          Assessment & Plan:

## 2013-09-17 NOTE — Progress Notes (Signed)
Pre visit review using our clinic review tool, if applicable. No additional management support is needed unless otherwise documented below in the visit note. 

## 2013-09-17 NOTE — Assessment & Plan Note (Signed)
OK use qod due to pains RTC 3 mo

## 2013-09-17 NOTE — Assessment & Plan Note (Signed)
NAS diet 

## 2013-09-17 NOTE — Assessment & Plan Note (Signed)
Potential benefits of a long term benzodiazepines  use as well as potential risks  and complications were explained to the patient and were aknowledged. 

## 2013-09-17 NOTE — Assessment & Plan Note (Signed)
Continue with current prescription therapy as reflected on the Med list.  

## 2013-12-21 ENCOUNTER — Other Ambulatory Visit: Payer: Self-pay

## 2013-12-21 MED ORDER — ROSUVASTATIN CALCIUM 5 MG PO TABS: ORAL_TABLET | ORAL | Status: DC

## 2014-01-02 ENCOUNTER — Encounter: Payer: Self-pay | Admitting: Internal Medicine

## 2014-01-02 ENCOUNTER — Ambulatory Visit (INDEPENDENT_AMBULATORY_CARE_PROVIDER_SITE_OTHER): Payer: BC Managed Care – PPO | Admitting: Internal Medicine

## 2014-01-02 VITALS — BP 150/92 | HR 88 | Temp 98.1°F | Ht 63.5 in | Wt 176.0 lb

## 2014-01-02 DIAGNOSIS — F341 Dysthymic disorder: Secondary | ICD-10-CM

## 2014-01-02 DIAGNOSIS — Z23 Encounter for immunization: Secondary | ICD-10-CM

## 2014-01-02 DIAGNOSIS — R51 Headache: Secondary | ICD-10-CM

## 2014-01-02 DIAGNOSIS — E669 Obesity, unspecified: Secondary | ICD-10-CM

## 2014-01-02 DIAGNOSIS — R519 Headache, unspecified: Secondary | ICD-10-CM | POA: Insufficient documentation

## 2014-01-02 DIAGNOSIS — Z0000 Encounter for general adult medical examination without abnormal findings: Secondary | ICD-10-CM

## 2014-01-02 DIAGNOSIS — E785 Hyperlipidemia, unspecified: Secondary | ICD-10-CM

## 2014-01-02 MED ORDER — ALPRAZOLAM 2 MG PO TABS: 2.0000 mg | ORAL_TABLET | Freq: Three times a day (TID) | ORAL | Status: DC | PRN

## 2014-01-02 MED ORDER — LORATADINE 10 MG PO TABS: 10.0000 mg | ORAL_TABLET | Freq: Every day | ORAL | Status: DC

## 2014-01-02 MED ORDER — BUTALBITAL-APAP-CAFFEINE 50-325-40 MG PO TABS: 1.0000 | ORAL_TABLET | Freq: Four times a day (QID) | ORAL | Status: DC | PRN

## 2014-01-02 MED ORDER — VENLAFAXINE HCL ER 150 MG PO CP24: ORAL_CAPSULE | ORAL | Status: DC

## 2014-01-02 MED ORDER — AZITHROMYCIN 250 MG PO TABS: ORAL_TABLET | ORAL | Status: DC

## 2014-01-02 NOTE — Assessment & Plan Note (Signed)
We discussed age appropriate health related issues, including available/recomended screening tests and vaccinations. We discussed a need for adhering to healthy diet and exercise. Labs/EKG were reviewed/ordered. All questions were answered.  Colonoscopy Dec '10

## 2014-01-02 NOTE — Assessment & Plan Note (Signed)
Continue with current prescription therapy as reflected on the Med list.  

## 2014-01-02 NOTE — Progress Notes (Signed)
   Subjective:     HPI The patient is here for a wellness exam.   C/o HAs, not feeling well, bloody mucus out of L nostril  F/u wt gain  The patient is here to follow up on chronic depression, anxiety, headaches and insomnia controlled with medicines   Wt Readings from Last 3 Encounters:  01/02/14 176 lb (79.833 kg)  09/17/13 174 lb (78.926 kg)  04/24/13 165 lb (74.844 kg)   BP Readings from Last 3 Encounters:  01/02/14 150/92  09/17/13 140/90  04/24/13 157/94      Review of Systems  Constitutional: Positive for unexpected weight change. Negative for chills, activity change, appetite change and fatigue.  HENT: Negative for congestion, mouth sores and sinus pressure.   Eyes: Negative for visual disturbance.  Respiratory: Negative for cough and chest tightness.   Gastrointestinal: Negative for nausea and abdominal pain.  Genitourinary: Negative for frequency, difficulty urinating and vaginal pain.  Musculoskeletal: Positive for arthralgias. Negative for back pain and gait problem.  Skin: Negative for pallor and rash.  Neurological: Negative for dizziness, tremors, weakness, numbness and headaches.  Psychiatric/Behavioral: Negative for confusion and sleep disturbance.       Objective:   Physical Exam  Constitutional: She appears well-developed. No distress.  Obese NAD   HENT:  Head: Normocephalic.  Right Ear: External ear normal.  Left Ear: External ear normal.  Nose: Nose normal.  Mouth/Throat: Oropharynx is clear and moist.  Eyes: Conjunctivae are normal. Pupils are equal, round, and reactive to light. Right eye exhibits no discharge. Left eye exhibits no discharge.  Neck: Normal range of motion. Neck supple. No JVD present. No tracheal deviation present. No thyromegaly present.  Cardiovascular: Normal rate, regular rhythm and normal heart sounds.   Pulmonary/Chest: No stridor. No respiratory distress. She has no wheezes.  Abdominal: Soft. Bowel sounds are  normal. She exhibits no distension and no mass. There is no tenderness. There is no rebound and no guarding.  Musculoskeletal: She exhibits no edema and no tenderness.  Lymphadenopathy:    She has no cervical adenopathy.  Neurological: She displays normal reflexes. No cranial nerve deficit. She exhibits normal muscle tone. Coordination normal.  Skin: No rash noted. No erythema.  Psychiatric: She has a normal mood and affect. Her behavior is normal. Judgment and thought content normal.   Lab Results  Component Value Date   WBC 5.8 10/19/2010   HGB 15.1* 11/22/2012   HCT 44.1 11/22/2012   PLT 349.0 10/19/2010   GLUCOSE 92 11/22/2012   CHOL 147 11/22/2012   TRIG 137.0 11/22/2012   HDL 48.80 11/22/2012   LDLDIRECT 104.0 01/27/2011   LDLCALC 71 11/22/2012   ALT 28 11/22/2012   ALT 28 11/22/2012   AST 24 11/22/2012   AST 24 11/22/2012   NA 140 11/22/2012   K 4.3 11/22/2012   CL 102 11/22/2012   CREATININE 0.6 11/22/2012   BUN 13 11/22/2012   CO2 29 11/22/2012   TSH 1.18 11/22/2012          Assessment & Plan:

## 2014-01-02 NOTE — Assessment & Plan Note (Signed)
?  sinusitis Zpac

## 2014-01-02 NOTE — Progress Notes (Signed)
Pre visit review using our clinic review tool, if applicable. No additional management support is needed unless otherwise documented below in the visit note. 

## 2014-01-02 NOTE — Assessment & Plan Note (Signed)
On Belviq 

## 2014-01-17 ENCOUNTER — Other Ambulatory Visit: Payer: Self-pay | Admitting: Internal Medicine

## 2014-01-17 ENCOUNTER — Other Ambulatory Visit (INDEPENDENT_AMBULATORY_CARE_PROVIDER_SITE_OTHER): Payer: BC Managed Care – PPO

## 2014-01-17 DIAGNOSIS — E785 Hyperlipidemia, unspecified: Secondary | ICD-10-CM

## 2014-01-17 DIAGNOSIS — Z0000 Encounter for general adult medical examination without abnormal findings: Secondary | ICD-10-CM

## 2014-01-17 DIAGNOSIS — F341 Dysthymic disorder: Secondary | ICD-10-CM

## 2014-01-17 LAB — CBC WITH DIFFERENTIAL/PLATELET
BASOS ABS: 0.1 10*3/uL (ref 0.0–0.1)
BASOS PCT: 0.7 % (ref 0.0–3.0)
EOS ABS: 0.2 10*3/uL (ref 0.0–0.7)
Eosinophils Relative: 2.8 % (ref 0.0–5.0)
HCT: 44.7 % (ref 36.0–46.0)
Hemoglobin: 15 g/dL (ref 12.0–15.0)
LYMPHS PCT: 22.4 % (ref 12.0–46.0)
Lymphs Abs: 1.8 10*3/uL (ref 0.7–4.0)
MCHC: 33.6 g/dL (ref 30.0–36.0)
MCV: 88.9 fl (ref 78.0–100.0)
MONOS PCT: 9.2 % (ref 3.0–12.0)
Monocytes Absolute: 0.7 10*3/uL (ref 0.1–1.0)
NEUTROS PCT: 64.9 % (ref 43.0–77.0)
Neutro Abs: 5.3 10*3/uL (ref 1.4–7.7)
Platelets: 311 10*3/uL (ref 150.0–400.0)
RBC: 5.03 Mil/uL (ref 3.87–5.11)
RDW: 12.6 % (ref 11.5–15.5)
WBC: 8.1 10*3/uL (ref 4.0–10.5)

## 2014-01-17 LAB — BASIC METABOLIC PANEL
BUN: 13 mg/dL (ref 6–23)
CALCIUM: 9.2 mg/dL (ref 8.4–10.5)
CO2: 30 meq/L (ref 19–32)
CREATININE: 0.7 mg/dL (ref 0.4–1.2)
Chloride: 103 mEq/L (ref 96–112)
GFR: 86 mL/min (ref >60.00)
Glucose, Bld: 93 mg/dL (ref 70–99)
Potassium: 4.4 mEq/L (ref 3.5–5.1)
SODIUM: 139 meq/L (ref 135–145)

## 2014-01-17 LAB — URINALYSIS, ROUTINE W REFLEX MICROSCOPIC
Bilirubin Urine: NEGATIVE
Hgb urine dipstick: NEGATIVE
Ketones, ur: NEGATIVE
Nitrite: NEGATIVE
RBC / HPF: NONE SEEN (ref 0–?)
SPECIFIC GRAVITY, URINE: 1.015 (ref 1.000–1.030)
Total Protein, Urine: NEGATIVE
UROBILINOGEN UA: 0.2 (ref 0.0–1.0)
Urine Glucose: NEGATIVE
pH: 6.5 (ref 5.0–8.0)

## 2014-01-17 LAB — HEPATIC FUNCTION PANEL
ALBUMIN: 3.9 g/dL (ref 3.5–5.2)
ALK PHOS: 116 U/L (ref 39–117)
ALT: 26 U/L (ref 0–35)
AST: 28 U/L (ref 0–37)
BILIRUBIN DIRECT: 0.1 mg/dL (ref 0.0–0.3)
Total Bilirubin: 0.4 mg/dL (ref 0.2–1.2)
Total Protein: 7.7 g/dL (ref 6.0–8.3)

## 2014-01-17 LAB — LIPID PANEL
Cholesterol: 185 mg/dL (ref 0–200)
HDL: 40.5 mg/dL (ref >39.00)
NonHDL: 144.5
Total CHOL/HDL Ratio: 5
Triglycerides: 227 mg/dL — ABNORMAL HIGH (ref 0.0–149.0)
VLDL: 45.4 mg/dL — AB (ref 0.0–40.0)

## 2014-01-17 LAB — VITAMIN B12: VITAMIN B 12: 233 pg/mL (ref 211–911)

## 2014-01-17 LAB — LDL CHOLESTEROL, DIRECT: Direct LDL: 111.7 mg/dL

## 2014-01-17 LAB — TSH: TSH: 2.2 u[IU]/mL (ref 0.35–4.50)

## 2014-01-17 MED ORDER — VITAMIN B-12 500 MCG SL SUBL: SUBLINGUAL_TABLET | SUBLINGUAL | Status: DC

## 2014-01-18 ENCOUNTER — Encounter: Payer: Self-pay | Admitting: Gastroenterology

## 2014-01-30 ENCOUNTER — Encounter: Payer: Self-pay | Admitting: Internal Medicine

## 2014-02-01 MED ORDER — FLUCONAZOLE 150 MG PO TABS: 150.0000 mg | ORAL_TABLET | Freq: Once | ORAL | Status: DC

## 2014-02-01 NOTE — Telephone Encounter (Signed)
Sent in fluconazole, she can take 1 pill once to cure the yeast infection.

## 2014-04-30 ENCOUNTER — Other Ambulatory Visit: Payer: Self-pay

## 2014-04-30 MED ORDER — ALPRAZOLAM 2 MG PO TABS: 2.0000 mg | ORAL_TABLET | Freq: Three times a day (TID) | ORAL | Status: DC | PRN

## 2014-06-11 ENCOUNTER — Other Ambulatory Visit: Payer: Self-pay | Admitting: Internal Medicine

## 2014-06-21 ENCOUNTER — Other Ambulatory Visit: Payer: Self-pay | Admitting: Internal Medicine

## 2014-06-27 MED ORDER — BUTALBITAL-APAP-CAFFEINE 50-325-40 MG PO TABS: 1.0000 | ORAL_TABLET | Freq: Four times a day (QID) | ORAL | Status: DC | PRN

## 2014-06-27 NOTE — Addendum Note (Signed)
Addended by: Cresenciano Lick on: 06/27/2014 09:57 AM   Modules accepted: Orders

## 2014-06-27 NOTE — Telephone Encounter (Signed)
Rx printed/signed by Dr. Doug Sou and faxed to pharmacy.

## 2014-09-19 ENCOUNTER — Encounter: Payer: Self-pay | Admitting: Internal Medicine

## 2014-09-22 ENCOUNTER — Other Ambulatory Visit: Payer: Self-pay | Admitting: Internal Medicine

## 2014-09-22 DIAGNOSIS — R5383 Other fatigue: Secondary | ICD-10-CM

## 2014-10-07 ENCOUNTER — Telehealth: Payer: Self-pay | Admitting: Internal Medicine

## 2014-10-07 NOTE — Telephone Encounter (Signed)
Contacted patient to verify status of current mammogram. Left message to contact office regarding this.

## 2014-10-08 ENCOUNTER — Ambulatory Visit: Payer: Self-pay | Admitting: Family

## 2014-11-05 ENCOUNTER — Other Ambulatory Visit: Payer: Self-pay | Admitting: Internal Medicine

## 2014-11-07 MED ORDER — BUTALBITAL-APAP-CAFFEINE 50-325-40 MG PO TABS: 1.0000 | ORAL_TABLET | Freq: Four times a day (QID) | ORAL | Status: DC | PRN

## 2014-11-07 NOTE — Telephone Encounter (Signed)
Rx printed/signed/faxed to [facility].

## 2014-11-07 NOTE — Addendum Note (Signed)
Addended by: Cresenciano Lick on: 11/07/2014 08:48 AM   Modules accepted: Orders

## 2014-11-25 ENCOUNTER — Other Ambulatory Visit: Payer: Self-pay | Admitting: *Deleted

## 2014-11-25 NOTE — Telephone Encounter (Signed)
Rf req for Alprazolam 2 mg 1 po tid prn. Ok to Rf? See pended meds.

## 2014-11-28 MED ORDER — ALPRAZOLAM 2 MG PO TABS: 2.0000 mg | ORAL_TABLET | Freq: Three times a day (TID) | ORAL | Status: DC | PRN

## 2014-11-28 NOTE — Telephone Encounter (Signed)
RX signed and faxed to mail order.

## 2014-11-28 NOTE — Addendum Note (Signed)
Addended by: Estell Harpin T on: 11/28/2014 10:07 AM   Modules accepted: Orders

## 2014-12-10 ENCOUNTER — Other Ambulatory Visit: Payer: Self-pay | Admitting: Internal Medicine

## 2014-12-15 NOTE — Telephone Encounter (Signed)
sch ov 

## 2014-12-17 MED ORDER — BUTALBITAL-APAP-CAFFEINE 50-325-40 MG PO TABS: 1.0000 | ORAL_TABLET | Freq: Four times a day (QID) | ORAL | Status: DC | PRN

## 2014-12-17 NOTE — Telephone Encounter (Signed)
I called pt- OV scheduled.

## 2014-12-17 NOTE — Addendum Note (Signed)
Addended by: Cresenciano Lick on: 12/17/2014 03:39 PM   Modules accepted: Orders

## 2015-01-15 ENCOUNTER — Ambulatory Visit (INDEPENDENT_AMBULATORY_CARE_PROVIDER_SITE_OTHER): Payer: BLUE CROSS/BLUE SHIELD | Admitting: Internal Medicine

## 2015-01-15 ENCOUNTER — Encounter: Payer: Self-pay | Admitting: Internal Medicine

## 2015-01-15 VITALS — BP 138/80 | HR 98 | Temp 98.5°F | Ht 63.5 in | Wt 175.0 lb

## 2015-01-15 DIAGNOSIS — Z0000 Encounter for general adult medical examination without abnormal findings: Secondary | ICD-10-CM

## 2015-01-15 DIAGNOSIS — J0100 Acute maxillary sinusitis, unspecified: Secondary | ICD-10-CM | POA: Diagnosis not present

## 2015-01-15 DIAGNOSIS — Z23 Encounter for immunization: Secondary | ICD-10-CM | POA: Diagnosis not present

## 2015-01-15 DIAGNOSIS — F341 Dysthymic disorder: Secondary | ICD-10-CM

## 2015-01-15 DIAGNOSIS — E669 Obesity, unspecified: Secondary | ICD-10-CM

## 2015-01-15 DIAGNOSIS — J019 Acute sinusitis, unspecified: Secondary | ICD-10-CM | POA: Insufficient documentation

## 2015-01-15 DIAGNOSIS — I10 Essential (primary) hypertension: Secondary | ICD-10-CM

## 2015-01-15 MED ORDER — LORCASERIN HCL 10 MG PO TABS: 1.0000 | ORAL_TABLET | Freq: Two times a day (BID) | ORAL | Status: DC

## 2015-01-15 MED ORDER — AZITHROMYCIN 250 MG PO TABS: ORAL_TABLET | ORAL | Status: DC

## 2015-01-15 MED ORDER — BUTALBITAL-APAP-CAFFEINE 50-325-40 MG PO TABS: 1.0000 | ORAL_TABLET | Freq: Four times a day (QID) | ORAL | Status: DC | PRN

## 2015-01-15 MED ORDER — LORATADINE 10 MG PO TABS: 10.0000 mg | ORAL_TABLET | Freq: Every day | ORAL | Status: DC

## 2015-01-15 NOTE — Assessment & Plan Note (Signed)
We discussed age appropriate health related issues, including available/recomended screening tests and vaccinations. We discussed a need for adhering to healthy diet and exercise. Labs/EKG were reviewed/ordered. All questions were answered.  Colonoscopy Dec '10

## 2015-01-15 NOTE — Assessment & Plan Note (Signed)
NAS diet Med - none

## 2015-01-15 NOTE — Assessment & Plan Note (Signed)
On Effexor Xanax prn  Potential benefits of a long term benzodiazepines  use as well as potential risks  and complications were explained to the patient and were aknowledged.

## 2015-01-15 NOTE — Progress Notes (Signed)
Pre visit review using our clinic review tool, if applicable. No additional management support is needed unless otherwise documented below in the visit note. 

## 2015-01-15 NOTE — Assessment & Plan Note (Signed)
Zpac 

## 2015-01-15 NOTE — Assessment & Plan Note (Signed)
Not taking Belviq. She would like to re-start

## 2015-01-15 NOTE — Progress Notes (Signed)
Subjective:  Patient ID: Julia Zimmerman, female    DOB: 03-30-1951  Age: 64 y.o. MRN: [id number]  CC: Annual Exam   HPI Julia Zimmerman presents for a well exam. C/o sinusitis x 1 wk.  Outpatient Prescriptions Prior to Visit  Medication Sig Dispense Refill  . alprazolam (XANAX) 2 MG tablet Take 1 tablet (2 mg total) by mouth 3 (three) times daily as needed. 270 tablet 0  . clotrimazole-betamethasone (LOTRISONE) cream Apply topically 2 (two) times daily. 30 g 3  . CRESTOR 5 MG tablet TAKE 1 TABLET DAILY 90 tablet 3  . Cyanocobalamin (VITAMIN B-12) 500 MCG SUBL 1 sl qd 150 tablet   . cyproheptadine (PERIACTIN) 4 MG tablet Take 1 tablet (4 mg total) by mouth 3 (three) times daily. 180 tablet 3  . fish oil-omega-3 fatty acids 1000 MG capsule Take 2 capsules (2 g total) by mouth daily. 90 capsule 3  . venlafaxine XR (EFFEXOR-XR) 150 MG 24 hr capsule 1 cap bid for depression 180 capsule 3  . azithromycin (ZITHROMAX) 250 MG tablet As directed 6 tablet 0  . butalbital-acetaminophen-caffeine (FIORICET, ESGIC) 50-325-40 MG per tablet Take 1 tablet by mouth every 6 (six) hours as needed for headache. 90 tablet 0  . fluconazole (DIFLUCAN) 150 MG tablet Take 1 tablet (150 mg total) by mouth once. 1 tablet 0  . loratadine (CLARITIN) 10 MG tablet Take 1 tablet (10 mg total) by mouth daily. 100 tablet 3  . Lorcaserin HCl (BELVIQ) 10 MG TABS Take 1 tablet by mouth 2 (two) times daily. 60 tablet 3   No facility-administered medications prior to visit.    ROS Review of Systems  Constitutional: Negative for chills, activity change, appetite change, fatigue and unexpected weight change.  HENT: Positive for congestion, postnasal drip and rhinorrhea. Negative for mouth sores and sinus pressure.   Eyes: Negative for visual disturbance.  Respiratory: Negative for cough and chest tightness.   Gastrointestinal: Negative for nausea and abdominal pain.  Genitourinary: Negative for frequency, difficulty  urinating and vaginal pain.  Musculoskeletal: Negative for back pain and gait problem.  Skin: Negative for pallor and rash.  Neurological: Negative for dizziness, tremors, weakness, numbness and headaches.  Psychiatric/Behavioral: Negative for suicidal ideas, confusion and sleep disturbance.  eryth nares mucosa  Objective:  BP 138/80 mmHg  Pulse 98  Temp(Src) 98.5 F (36.9 C) (Oral)  Ht 5' 3.5" (1.613 m)  Wt 175 lb (79.379 kg)  BMI 30.51 kg/m2  SpO2 93%  BP Readings from Last 3 Encounters:  01/15/15 138/80  01/02/14 150/92  09/17/13 140/90    Wt Readings from Last 3 Encounters:  01/15/15 175 lb (79.379 kg)  01/02/14 176 lb (79.833 kg)  09/17/13 174 lb (78.926 kg)    Physical Exam  Constitutional: She appears well-developed. No distress.  HENT:  Head: Normocephalic.  Right Ear: External ear normal.  Left Ear: External ear normal.  Nose: Nose normal.  Mouth/Throat: Oropharynx is clear and moist.  Eyes: Conjunctivae are normal. Pupils are equal, round, and reactive to light. Right eye exhibits no discharge. Left eye exhibits no discharge.  Neck: Normal range of motion. Neck supple. No JVD present. No tracheal deviation present. No thyromegaly present.  Cardiovascular: Normal rate, regular rhythm and normal heart sounds.   Pulmonary/Chest: No stridor. No respiratory distress. She has no wheezes.  Abdominal: Soft. Bowel sounds are normal. She exhibits no distension and no mass. There is no tenderness. There is no rebound and no guarding.  Musculoskeletal:  She exhibits no edema or tenderness.  Lymphadenopathy:    She has no cervical adenopathy.  Neurological: She displays normal reflexes. No cranial nerve deficit. She exhibits normal muscle tone. Coordination normal.  Skin: No rash noted. No erythema.  Psychiatric: She has a normal mood and affect. Her behavior is normal. Judgment and thought content normal.  Obese  Lab Results  Component Value Date   WBC 8.1 01/17/2014     HGB 15.0 01/17/2014   HCT 44.7 01/17/2014   PLT 311.0 01/17/2014   GLUCOSE 93 01/17/2014   CHOL 185 01/17/2014   TRIG 227.0* 01/17/2014   HDL 40.50 01/17/2014   LDLDIRECT 111.7 01/17/2014   LDLCALC 71 11/22/2012   ALT 26 01/17/2014   AST 28 01/17/2014   NA 139 01/17/2014   K 4.4 01/17/2014   CL 103 01/17/2014   CREATININE 0.7 01/17/2014   BUN 13 01/17/2014   CO2 30 01/17/2014   TSH 2.20 01/17/2014    No results found.  Assessment & Plan:   Julia Zimmerman was seen today for annual exam.  Diagnoses and all orders for this visit:  Well adult exam -     Basic metabolic panel; Future -     CBC with Differential/Platelet; Future -     Lipid panel; Future -     TSH; Future -     T4, free; Future -     Urinalysis; Future -     Vit D  25 hydroxy (rtn osteoporosis monitoring); Future -     Vitamin B12; Future -     Hepatic function panel; Future  Essential hypertension, benign -     T4, free; Future -     Vit D  25 hydroxy (rtn osteoporosis monitoring); Future -     Vitamin B12; Future -     Hepatic function panel; Future  Obesity -     T4, free; Future -     Vit D  25 hydroxy (rtn osteoporosis monitoring); Future -     Vitamin B12; Future -     Hepatic function panel; Future  ANXIETY DEPRESSION  Acute maxillary sinusitis, recurrence not specified  Need for shingles vaccine -     Varicella-zoster vaccine subcutaneous  Other orders -     loratadine (CLARITIN) 10 MG tablet; Take 1 tablet (10 mg total) by mouth daily. -     butalbital-acetaminophen-caffeine (FIORICET, ESGIC) 50-325-40 MG per tablet; Take 1 tablet by mouth every 6 (six) hours as needed for headache. -     azithromycin (ZITHROMAX) 250 MG tablet; As directed -     Lorcaserin HCl (BELVIQ) 10 MG TABS; Take 1 tablet by mouth 2 (two) times daily.   I am having Julia Zimmerman maintain her clotrimazole-betamethasone, fish oil-omega-3 fatty acids, cyproheptadine, venlafaxine XR, Vitamin B-12, CRESTOR, alprazolam,  omeprazole, loratadine, butalbital-acetaminophen-caffeine, azithromycin, and Lorcaserin HCl.  Meds ordered this encounter  Medications  . omeprazole (PRILOSEC OTC) 20 MG tablet    Sig: Take 20 mg by mouth as needed.  . loratadine (CLARITIN) 10 MG tablet    Sig: Take 1 tablet (10 mg total) by mouth daily.    Dispense:  100 tablet    Refill:  3  . butalbital-acetaminophen-caffeine (FIORICET, ESGIC) 50-325-40 MG per tablet    Sig: Take 1 tablet by mouth every 6 (six) hours as needed for headache.    Dispense:  90 tablet    Refill:  0  . azithromycin (ZITHROMAX) 250 MG tablet    Sig: As directed  Dispense:  6 tablet    Refill:  0  . Lorcaserin HCl (BELVIQ) 10 MG TABS    Sig: Take 1 tablet by mouth 2 (two) times daily.    Dispense:  60 tablet    Refill:  3     Follow-up: Return in about 6 months (around 07/15/2015) for a follow-up visit.  Walker Kehr, MD

## 2015-01-21 ENCOUNTER — Other Ambulatory Visit: Payer: Self-pay | Admitting: Internal Medicine

## 2015-01-21 ENCOUNTER — Encounter: Payer: Self-pay | Admitting: Internal Medicine

## 2015-01-21 MED ORDER — FLUCONAZOLE 150 MG PO TABS: 150.0000 mg | ORAL_TABLET | Freq: Once | ORAL | Status: DC

## 2015-01-24 ENCOUNTER — Other Ambulatory Visit (INDEPENDENT_AMBULATORY_CARE_PROVIDER_SITE_OTHER): Payer: BLUE CROSS/BLUE SHIELD

## 2015-01-24 DIAGNOSIS — Z0000 Encounter for general adult medical examination without abnormal findings: Secondary | ICD-10-CM | POA: Diagnosis not present

## 2015-01-24 DIAGNOSIS — I10 Essential (primary) hypertension: Secondary | ICD-10-CM | POA: Diagnosis not present

## 2015-01-24 DIAGNOSIS — R7989 Other specified abnormal findings of blood chemistry: Secondary | ICD-10-CM | POA: Diagnosis not present

## 2015-01-24 DIAGNOSIS — E669 Obesity, unspecified: Secondary | ICD-10-CM | POA: Diagnosis not present

## 2015-01-24 LAB — HEPATIC FUNCTION PANEL
ALBUMIN: 3.8 g/dL (ref 3.5–5.2)
ALT: 13 U/L (ref 0–35)
AST: 16 U/L (ref 0–37)
Alkaline Phosphatase: 124 U/L — ABNORMAL HIGH (ref 39–117)
BILIRUBIN TOTAL: 0.3 mg/dL (ref 0.2–1.2)
Bilirubin, Direct: 0 mg/dL (ref 0.0–0.3)
TOTAL PROTEIN: 7 g/dL (ref 6.0–8.3)

## 2015-01-24 LAB — BASIC METABOLIC PANEL
BUN: 11 mg/dL (ref 6–23)
CHLORIDE: 104 meq/L (ref 96–112)
CO2: 28 meq/L (ref 19–32)
Calcium: 9.3 mg/dL (ref 8.4–10.5)
Creatinine, Ser: 0.64 mg/dL (ref 0.40–1.20)
GFR: 99.77 mL/min (ref >60.00)
Glucose, Bld: 82 mg/dL (ref 70–99)
POTASSIUM: 4 meq/L (ref 3.5–5.1)
SODIUM: 140 meq/L (ref 135–145)

## 2015-01-24 LAB — CBC WITH DIFFERENTIAL/PLATELET
BASOS PCT: 0.9 % (ref 0.0–3.0)
Basophils Absolute: 0.1 10*3/uL (ref 0.0–0.1)
EOS ABS: 0.3 10*3/uL (ref 0.0–0.7)
EOS PCT: 4.3 % (ref 0.0–5.0)
HEMATOCRIT: 42.1 % (ref 36.0–46.0)
HEMOGLOBIN: 14 g/dL (ref 12.0–15.0)
LYMPHS PCT: 28.5 % (ref 12.0–46.0)
Lymphs Abs: 1.8 10*3/uL (ref 0.7–4.0)
MCHC: 33.2 g/dL (ref 30.0–36.0)
MCV: 88.5 fl (ref 78.0–100.0)
MONOS PCT: 10.4 % (ref 3.0–12.0)
Monocytes Absolute: 0.7 10*3/uL (ref 0.1–1.0)
NEUTROS ABS: 3.6 10*3/uL (ref 1.4–7.7)
Neutrophils Relative %: 55.9 % (ref 43.0–77.0)
PLATELETS: 332 10*3/uL (ref 150.0–400.0)
RBC: 4.76 Mil/uL (ref 3.87–5.11)
RDW: 13 % (ref 11.5–15.5)
WBC: 6.4 10*3/uL (ref 4.0–10.5)

## 2015-01-24 LAB — LIPID PANEL
CHOL/HDL RATIO: 4
Cholesterol: 160 mg/dL (ref 0–200)
HDL: 40.9 mg/dL (ref >39.00)
NONHDL: 118.86
TRIGLYCERIDES: 255 mg/dL — AB (ref 0.0–149.0)
VLDL: 51 mg/dL — ABNORMAL HIGH (ref 0.0–40.0)

## 2015-01-24 LAB — VITAMIN D 25 HYDROXY (VIT D DEFICIENCY, FRACTURES): VITD: 19.88 ng/mL — AB (ref 30.00–100.00)

## 2015-01-24 LAB — URINALYSIS, ROUTINE W REFLEX MICROSCOPIC
Bilirubin Urine: NEGATIVE
Hgb urine dipstick: NEGATIVE
KETONES UR: NEGATIVE
Nitrite: NEGATIVE
PH: 5 (ref 5.0–8.0)
SPECIFIC GRAVITY, URINE: 1.015 (ref 1.000–1.030)
Total Protein, Urine: NEGATIVE
UROBILINOGEN UA: 0.2 (ref 0.0–1.0)
Urine Glucose: NEGATIVE

## 2015-01-24 LAB — T4, FREE: Free T4: 0.71 ng/dL (ref 0.60–1.60)

## 2015-01-24 LAB — VITAMIN B12: VITAMIN B 12: 347 pg/mL (ref 211–911)

## 2015-01-24 LAB — LDL CHOLESTEROL, DIRECT: LDL DIRECT: 69 mg/dL

## 2015-01-24 LAB — TSH: TSH: 1.27 u[IU]/mL (ref 0.35–4.50)

## 2015-01-25 MED ORDER — ERGOCALCIFEROL 1.25 MG (50000 UT) PO CAPS: 50000.0000 [IU] | ORAL_CAPSULE | ORAL | Status: DC

## 2015-01-30 ENCOUNTER — Other Ambulatory Visit: Payer: Self-pay | Admitting: Internal Medicine

## 2015-01-30 ENCOUNTER — Encounter: Payer: Self-pay | Admitting: Internal Medicine

## 2015-01-31 ENCOUNTER — Other Ambulatory Visit: Payer: Self-pay | Admitting: *Deleted

## 2015-01-31 MED ORDER — ALPRAZOLAM 2 MG PO TABS: 2.0000 mg | ORAL_TABLET | Freq: Three times a day (TID) | ORAL | Status: DC | PRN

## 2015-03-21 ENCOUNTER — Other Ambulatory Visit: Payer: Self-pay | Admitting: Internal Medicine

## 2015-03-24 NOTE — Telephone Encounter (Signed)
Rx faxed to pharmacy listed below.

## 2015-04-15 ENCOUNTER — Encounter: Payer: Self-pay | Admitting: Internal Medicine

## 2015-04-16 NOTE — Telephone Encounter (Signed)
[facility] spoke with natalie/pharmacist verified if rx for butalbital was received on 11/18, Per Lanelle Bal rx was not received. Gave verbal authorization for Butalbital.../lmb

## 2015-05-19 ENCOUNTER — Telehealth: Payer: Self-pay | Admitting: Internal Medicine

## 2015-05-19 ENCOUNTER — Other Ambulatory Visit: Payer: Self-pay

## 2015-05-19 MED ORDER — ALPRAZOLAM 2 MG PO TABS: 2.0000 mg | ORAL_TABLET | Freq: Three times a day (TID) | ORAL | Status: DC | PRN

## 2015-05-19 NOTE — Telephone Encounter (Signed)
Thx AP 

## 2015-05-19 NOTE — Telephone Encounter (Signed)
Patient states she is waiting on alprazolam to [facility].  States her mom just past away and she is out of medication.  She is requesting a two day supply to be sent to [facility] in [place] on [address] to get her through until the medication arrives.  States she is having panic attacks.

## 2015-05-19 NOTE — Telephone Encounter (Signed)
Pt is following up on the request below. She is in [place] now and would really like to pick it up before she heads back to [place].

## 2015-05-19 NOTE — Telephone Encounter (Signed)
Called in 20 pills xanax to [facility] per patient request

## 2015-05-19 NOTE — Telephone Encounter (Signed)
Per dr Alain Marion, ok to send in 20 pills Xanax, patient advised by lori/front office

## 2015-05-19 NOTE — Telephone Encounter (Signed)
Per dr Alain Marion, ok to call in 20 pills xanax, patient advised

## 2015-06-19 ENCOUNTER — Telehealth: Payer: Self-pay | Admitting: Internal Medicine

## 2015-06-19 ENCOUNTER — Encounter (HOSPITAL_COMMUNITY): Payer: Self-pay

## 2015-06-19 ENCOUNTER — Emergency Department (HOSPITAL_COMMUNITY): Payer: BLUE CROSS/BLUE SHIELD

## 2015-06-19 ENCOUNTER — Emergency Department (HOSPITAL_COMMUNITY)
Admission: EM | Admit: 2015-06-19 | Discharge: 2015-06-20 | Disposition: A | Discharge status: Home / Self Care | Payer: BLUE CROSS/BLUE SHIELD | Attending: Emergency Medicine | Admitting: Emergency Medicine

## 2015-06-19 DIAGNOSIS — R079 Chest pain, unspecified: Secondary | ICD-10-CM | POA: Diagnosis not present

## 2015-06-19 DIAGNOSIS — I10 Essential (primary) hypertension: Secondary | ICD-10-CM | POA: Diagnosis not present

## 2015-06-19 LAB — BASIC METABOLIC PANEL
ANION GAP: 8 (ref 5–15)
BUN: 17 mg/dL (ref 6–20)
CHLORIDE: 104 mmol/L (ref 101–111)
CO2: 24 mmol/L (ref 22–32)
Calcium: 9 mg/dL (ref 8.9–10.3)
Creatinine, Ser: 0.6 mg/dL (ref 0.44–1.00)
GFR calc Af Amer: >60 mL/min (ref >60)
GLUCOSE: 100 mg/dL — AB (ref 65–99)
POTASSIUM: 3.8 mmol/L (ref 3.5–5.1)
Sodium: 136 mmol/L (ref 135–145)

## 2015-06-19 LAB — CBC
HEMATOCRIT: 42.4 % (ref 36.0–46.0)
HEMOGLOBIN: 13.9 g/dL (ref 12.0–15.0)
MCH: 29.1 pg (ref 26.0–34.0)
MCHC: 32.8 g/dL (ref 30.0–36.0)
MCV: 88.7 fL (ref 78.0–100.0)
Platelets: 311 10*3/uL (ref 150–400)
RBC: 4.78 MIL/uL (ref 3.87–5.11)
RDW: 13.2 % (ref 11.5–15.5)
WBC: 6.7 10*3/uL (ref 4.0–10.5)

## 2015-06-19 LAB — I-STAT TROPONIN, ED: Troponin i, poc: 0 ng/mL (ref 0.00–0.08)

## 2015-06-19 NOTE — Telephone Encounter (Signed)
PLEASE NOTE: All timestamps contained within this report are represented as [place] Standard Time. CONFIDENTIALTY NOTICE: This fax transmission is intended only for the addressee. It contains information that is legally privileged, confidential or otherwise protected from use or disclosure. If you are not the intended recipient, you are strictly prohibited from reviewing, disclosing, copying using or disseminating any of this information or taking any action in reliance on or regarding this information. If you have received this fax in error, please notify us immediately by telephone so that we can arrange for its return to [place]. Phone: [phone], Toll-Free: [phone], Fax: [phone] Page: 1 of 1 Call Id: [id number] [place] Patient Name: Julia Zimmerman DOB: 1950-08-26 Initial Comment Caller states she has been under a lot of stress d/ t sudden death of her mother. Her BP is elevated 152/100 and she is having chest pain & panic attacks. Nurse Assessment Nurse: [name], RN, [name] Date/Time (Eastern Time): 06/19/2015 11:23:06 AM Confirm and document reason for call. If symptomatic, describe symptoms. You must click the next button to save text entered. ---Caller states she has been under a lot of stress d/t the unexpected death of her mother. Has had elevated BP 152/100, chest pain & panic attacks. Has tried taking her anxiety rx during the day. The chest pain started about 2 weeks, it is a sharp pain on left side, sometimes radiating into the left shoulder. Has the patient traveled out of the country within the last 30 days? ---Not Applicable Does the patient have any new or worsening symptoms? ---Yes Will a triage be completed? ---Yes Related visit to physician within the last 2 weeks? ---No Does the PT have any chronic conditions? (i.e. diabetes, asthma, etc.) ---Yes List chronic conditions.  ---on BP & cholesterol rxs, anxiety Is this a behavioral health or substance abuse call? ---No Guidelines Guideline Title Affirmed Question Affirmed Notes Chest Pain [1] Chest pain lasts > 5 minutes AND [2] age > 28 Final Disposition User [name], RN, [name] Comments Caller will have someone take her in to the ER rather than calling for EMS. She states the chest pain lasts longer than 5 mins. Referrals [name] - ED Disagree/Comply: Comply

## 2015-06-19 NOTE — ED Notes (Signed)
Chest pain off/on x 2 weeks.  Went to work.  Pain got intense.  No shortness of breath.  No cough/congestion.  No fever.

## 2015-06-20 ENCOUNTER — Ambulatory Visit (INDEPENDENT_AMBULATORY_CARE_PROVIDER_SITE_OTHER): Payer: BLUE CROSS/BLUE SHIELD | Admitting: Nurse Practitioner

## 2015-06-20 ENCOUNTER — Telehealth: Payer: Self-pay | Admitting: Internal Medicine

## 2015-06-20 ENCOUNTER — Encounter: Payer: Self-pay | Admitting: Nurse Practitioner

## 2015-06-20 VITALS — BP 144/90 | HR 101 | Temp 98.1°F | Ht 63.5 in | Wt 172.8 lb

## 2015-06-20 DIAGNOSIS — F4321 Adjustment disorder with depressed mood: Secondary | ICD-10-CM | POA: Diagnosis not present

## 2015-06-20 DIAGNOSIS — F4329 Adjustment disorder with other symptoms: Secondary | ICD-10-CM | POA: Insufficient documentation

## 2015-06-20 DIAGNOSIS — Z7689 Persons encountering health services in other specified circumstances: Secondary | ICD-10-CM

## 2015-06-20 DIAGNOSIS — I10 Essential (primary) hypertension: Secondary | ICD-10-CM

## 2015-06-20 DIAGNOSIS — Z634 Disappearance and death of family member: Principal | ICD-10-CM

## 2015-06-20 MED ORDER — HYDROCHLOROTHIAZIDE 12.5 MG PO CAPS
12.5000 mg | ORAL_CAPSULE | Freq: Every day | ORAL | Status: DC
Start: 2015-06-20 — End: 2016-03-12

## 2015-06-20 NOTE — Patient Instructions (Addendum)
Let us know about any paperwork we need to fill out.   BP medication is at your pharmacy. Keep checking it while on this medication (take in the morning)   Follow up with Dr. Alain Marion in March   We will call you about your referral.

## 2015-06-20 NOTE — Telephone Encounter (Signed)
Paperwork completed and placed on Lorane Gell' desk for signature

## 2015-06-20 NOTE — Telephone Encounter (Signed)
Completed and returned to [facility]

## 2015-06-20 NOTE — Progress Notes (Signed)
Pre visit review using our clinic review tool, if applicable. No additional management support is needed unless otherwise documented below in the visit note. 

## 2015-06-20 NOTE — Telephone Encounter (Signed)
States seen Julia Zimmerman today.  States that FMLA forms will be faxed over.  FMLA is requesting dates of short therm Dis Feb 17 through March 19th.

## 2015-06-20 NOTE — Assessment & Plan Note (Signed)
BP Readings from Last 3 Encounters:  06/20/15 144/90  06/19/15 159/105  01/15/15 138/80   Patient's blood pressure is elevated consistently over the last 2 weeks due to recent loss of family and friends. After discussion patient is willing to try hydrochlorothiazide 12.5 mg Since this into the pharmacy she will follow-up with Dr. Alain Marion in one month. Asked her to check her BP at home and bring log

## 2015-06-20 NOTE — Telephone Encounter (Signed)
Original paperwork placed in cabinet for pt pick up (pt advised of same via personal VM), copy sent to scan

## 2015-06-20 NOTE — Assessment & Plan Note (Addendum)
New problem to me Patient loss mother unexpectedly, lost has been in the past, lost best friend 2 weeks after her mother, and her dog all within a short time span.   She is having somatic complaints and feels she is overwhelmed and alone.  She is willing to go to grief counseling this is going to be set up urgently She is wanting to try exercise to help with her ental status and I encouraged this Continue Xanax at nighttime and when necessary on weekends (half tablet) And the Effexor Patient reports she might send paperwork over from HR to be filled out requesting an absence

## 2015-06-20 NOTE — Progress Notes (Signed)
Patient ID: Julia Zimmerman, female    DOB: 1950-07-25  Age: 65 y.o. MRN: [id number]  CC: Chest Pain and Panic Attack   HPI Julia Zimmerman presents for Chest pain x 2 weeks.   1) Patient reports she has recently had 2 deaths of close people in her family.  Pt arrived at the ED yesterday for an EKG, negative troponin and left after this was done  She reports her BP was 152/100 and had panic attacks/chest pain  No sick contacts or viral related symptoms  Chest pain is sharp and elevated BP worrying her  Checks her BP at home Crying frequently, headaches worsening, would like to see grief counseling  Treatment to date: Xanax 2 mg TID as needed (takes at night to sleep and on weekends) Effexor XR 150 mg daily   History Julia Zimmerman has a past medical history of Hyperlipidemia; GERD (gastroesophageal reflux disease); Depression; Anxiety; Learning disability; ADD (10/30/2008); ANXIETY DEPRESSION (03/29/2007); Cough (04/21/2010); Episodic tension type headache (11/07/2007); Essential hypertension, benign (05/15/2010); GERD (04/21/2010); HYPERLIPIDEMIA (05/15/2010); ONYCHOMYCOSIS (10/30/2008); RHINITIS (04/21/2010); and Endometriosis.   She has past surgical history that includes laparoscopy (1994); Neck mass excision (1974); G0P0; and laparotomy (1989).   Her family history includes Arthritis in her father; Diabetes in her mother; Hypertension in her mother. There is no history of Cancer, COPD, Heart disease, or Colon cancer.She reports that she quit smoking about 24 years ago. Her smoking use included Cigarettes. She has a 28.5 pack-year smoking history. She has never used smokeless tobacco. She reports that she does not drink alcohol or use illicit drugs.  Outpatient Prescriptions Prior to Visit  Medication Sig Dispense Refill  . alprazolam (XANAX) 2 MG tablet Take 1 tablet (2 mg total) by mouth 3 (three) times daily as needed. 20 tablet 0  . butalbital-acetaminophen-caffeine (FIORICET, ESGIC)  50-325-40 MG tablet TAKE 1 TABLET EVERY 6 HOURS AS NEEDED FOR HEADACHE 90 tablet 1  . clotrimazole-betamethasone (LOTRISONE) cream Apply topically 2 (two) times daily. 30 g 3  . CRESTOR 5 MG tablet TAKE 1 TABLET DAILY 90 tablet 3  . Cyanocobalamin (VITAMIN B-12) 500 MCG SUBL 1 sl qd 150 tablet   . cyproheptadine (PERIACTIN) 4 MG tablet Take 1 tablet (4 mg total) by mouth 3 (three) times daily. 180 tablet 3  . ergocalciferol (VITAMIN D2) 50000 UNITS capsule Take 1 capsule (50,000 Units total) by mouth once a week. 6 capsule 0  . fish oil-omega-3 fatty acids 1000 MG capsule Take 2 capsules (2 g total) by mouth daily. 90 capsule 3  . fluconazole (DIFLUCAN) 150 MG tablet Take 1 tablet (150 mg total) by mouth once. 1 tablet 1  . loratadine (CLARITIN) 10 MG tablet Take 1 tablet (10 mg total) by mouth daily. 100 tablet 3  . Lorcaserin HCl (BELVIQ) 10 MG TABS Take 1 tablet by mouth 2 (two) times daily. 60 tablet 3  . omeprazole (PRILOSEC OTC) 20 MG tablet Take 20 mg by mouth as needed.    . venlafaxine XR (EFFEXOR-XR) 150 MG 24 hr capsule 1 cap bid for depression 180 capsule 3  . azithromycin (ZITHROMAX) 250 MG tablet As directed 6 tablet 0   No facility-administered medications prior to visit.    ROS Review of Systems  Constitutional: Negative for fever, chills, diaphoresis and fatigue.  Respiratory: Negative for chest tightness, shortness of breath and wheezing.   Cardiovascular: Positive for chest pain. Negative for palpitations and leg swelling.  Gastrointestinal: Negative for nausea, vomiting and diarrhea.  Skin:  Negative for rash.  Neurological: Negative for dizziness, weakness, numbness and headaches.  Psychiatric/Behavioral: Positive for sleep disturbance. Negative for suicidal ideas. The patient is nervous/anxious.     Objective:  BP 144/90 mmHg  Pulse 101  Temp(Src) 98.1 F (36.7 C) (Oral)  Ht 5' 3.5" (1.613 m)  Wt 172 lb 12 oz (78.359 kg)  BMI 30.12 kg/m2  SpO2 93%  Physical  Exam  Constitutional: She is oriented to person, place, and time. She appears well-developed and well-nourished. No distress.  HENT:  Head: Normocephalic and atraumatic.  Right Ear: External ear normal.  Left Ear: External ear normal.  Cardiovascular: Normal rate, regular rhythm and normal heart sounds.  Exam reveals no gallop and no friction rub.   No murmur heard. Pulmonary/Chest: Effort normal and breath sounds normal. No respiratory distress. She has no wheezes. She has no rales. She exhibits no tenderness.  Neurological: She is alert and oriented to person, place, and time. No cranial nerve deficit. She exhibits normal muscle tone. Coordination normal.  Skin: Skin is warm and dry. No rash noted. She is not diaphoretic.  Psychiatric: She has a normal mood and affect. Her behavior is normal. Judgment and thought content normal.  Tearful   Assessment & Plan:   Julia Zimmerman was seen today for chest pain and panic attack.  Diagnoses and all orders for this visit:  Complicated grief -     Ambulatory referral to Psychology  Essential hypertension, benign  Other orders -     hydrochlorothiazide (MICROZIDE) 12.5 MG capsule; Take 1 capsule (12.5 mg total) by mouth daily.   I have discontinued Julia Zimmerman's azithromycin. I am also having her start on hydrochlorothiazide. Additionally, I am having her maintain her clotrimazole-betamethasone, fish oil-omega-3 fatty acids, cyproheptadine, venlafaxine XR, Vitamin B-12, CRESTOR, omeprazole, loratadine, Lorcaserin HCl, fluconazole, ergocalciferol, butalbital-acetaminophen-caffeine, and alprazolam.  Meds ordered this encounter  Medications  . hydrochlorothiazide (MICROZIDE) 12.5 MG capsule    Sig: Take 1 capsule (12.5 mg total) by mouth daily.    Dispense:  30 capsule    Refill:  1    Order Specific Question:  Supervising Provider    Answer:  Crecencio Mc [[id number]]     Follow-up: Return if symptoms worsen or fail to improve.

## 2015-06-30 ENCOUNTER — Ambulatory Visit (INDEPENDENT_AMBULATORY_CARE_PROVIDER_SITE_OTHER): Payer: BLUE CROSS/BLUE SHIELD | Admitting: Licensed Clinical Social Worker

## 2015-06-30 DIAGNOSIS — F4323 Adjustment disorder with mixed anxiety and depressed mood: Secondary | ICD-10-CM

## 2015-07-05 ENCOUNTER — Other Ambulatory Visit: Payer: Self-pay | Admitting: Internal Medicine

## 2015-07-07 NOTE — Telephone Encounter (Signed)
MD out of office pls advise on refill.../lmb 

## 2015-07-07 NOTE — Telephone Encounter (Signed)
[facility]...Johny Chess

## 2015-07-15 ENCOUNTER — Encounter: Payer: Self-pay | Admitting: Internal Medicine

## 2015-07-15 ENCOUNTER — Ambulatory Visit (INDEPENDENT_AMBULATORY_CARE_PROVIDER_SITE_OTHER): Payer: BLUE CROSS/BLUE SHIELD | Admitting: Internal Medicine

## 2015-07-15 VITALS — BP 140/98 | HR 98 | Wt 172.0 lb

## 2015-07-15 DIAGNOSIS — R0789 Other chest pain: Secondary | ICD-10-CM

## 2015-07-15 DIAGNOSIS — F4329 Adjustment disorder with other symptoms: Secondary | ICD-10-CM

## 2015-07-15 DIAGNOSIS — Z634 Disappearance and death of family member: Secondary | ICD-10-CM

## 2015-07-15 DIAGNOSIS — M79622 Pain in left upper arm: Secondary | ICD-10-CM | POA: Insufficient documentation

## 2015-07-15 DIAGNOSIS — R079 Chest pain, unspecified: Secondary | ICD-10-CM | POA: Insufficient documentation

## 2015-07-15 DIAGNOSIS — F4321 Adjustment disorder with depressed mood: Secondary | ICD-10-CM | POA: Diagnosis not present

## 2015-07-15 MED ORDER — ALPRAZOLAM 2 MG PO TABS: 2.0000 mg | ORAL_TABLET | Freq: Three times a day (TID) | ORAL | Status: DC | PRN

## 2015-07-15 MED ORDER — VENLAFAXINE HCL ER 150 MG PO CP24: ORAL_CAPSULE | ORAL | Status: DC

## 2015-07-15 MED ORDER — HYDROCODONE-ACETAMINOPHEN 5-325 MG PO TABS: 1.0000 | ORAL_TABLET | Freq: Two times a day (BID) | ORAL | Status: DC | PRN

## 2015-07-15 MED ORDER — ACYCLOVIR 800 MG PO TABS: 800.0000 mg | ORAL_TABLET | Freq: Every day | ORAL | Status: DC

## 2015-07-15 MED ORDER — PREDNISONE 10 MG PO TABS: ORAL_TABLET | ORAL | Status: DC

## 2015-07-15 NOTE — Progress Notes (Signed)
Pre visit review using our clinic review tool, if applicable. No additional management support is needed unless otherwise documented below in the visit note. 

## 2015-07-15 NOTE — Assessment & Plan Note (Addendum)
3/17 ? Zoster rash on L neck/pain. Doubt radiculopathy, CAD, gallstones Acyclovir Prednisone 10 mg: take 4 tabs a day x 3 days; then 3 tabs a day x 4 days; then 2 tabs a day x 4 days, then 1 tab a day x 6 days, then stop. Take pc.  Potential benefits of  steroid  use as well as potential risks  and complications were explained to the patient and were aknowledged.   Norco prn  Potential benefits of  opioids use as well as potential risks (i.e. addiction risk, apnea etc) and complications (i.e. Somnolence, constipation and others) were explained to the patient and were aknowledged.

## 2015-07-15 NOTE — Assessment & Plan Note (Signed)
3/17 ? Zoster rash on L neck/pain. Doubt radiculopathy, CAD, gallstones Acyclovir Prednisone 10 mg: take 4 tabs a day x 3 days; then 3 tabs a day x 4 days; then 2 tabs a day x 4 days, then 1 tab a day x 6 days, then stop. Take pc.  Potential benefits of  steroid  use as well as potential risks  and complications were explained to the patient and were aknowledged.   Norco prn  Potential benefits of  opioids use as well as potential risks (i.e. addiction risk, apnea etc) and complications (i.e. Somnolence, constipation and others) were explained to the patient and were aknowledged.

## 2015-07-15 NOTE — Progress Notes (Signed)
Subjective:  Patient ID: Julia Zimmerman, female    DOB: 07/24/1950  Age: 65 y.o. MRN: [id number]  CC: No chief complaint on file.   HPI Julia Zimmerman presents for a 6 mo f/up Pt is grieving her mother's death on 2015/05/29 C/o stress C/o CP, panic attacks; pt was seen in ER at [facility] Prior to Visit  Medication Sig Dispense Refill  . butalbital-acetaminophen-caffeine (FIORICET, ESGIC) 50-325-40 MG tablet TAKE 1 TABLET EVERY 6 HOURS AS NEEDED FOR HEADACHE 30 tablet 0  . CRESTOR 5 MG tablet TAKE 1 TABLET DAILY 90 tablet 3  . Cyanocobalamin (VITAMIN B-12) 500 MCG SUBL 1 sl qd 150 tablet   . cyproheptadine (PERIACTIN) 4 MG tablet Take 1 tablet (4 mg total) by mouth 3 (three) times daily. 180 tablet 3  . ergocalciferol (VITAMIN D2) 50000 UNITS capsule Take 1 capsule (50,000 Units total) by mouth once a week. 6 capsule 0  . fish oil-omega-3 fatty acids 1000 MG capsule Take 2 capsules (2 g total) by mouth daily. 90 capsule 3  . hydrochlorothiazide (MICROZIDE) 12.5 MG capsule Take 1 capsule (12.5 mg total) by mouth daily. 30 capsule 1  . loratadine (CLARITIN) 10 MG tablet Take 1 tablet (10 mg total) by mouth daily. 100 tablet 3  . Lorcaserin HCl (BELVIQ) 10 MG TABS Take 1 tablet by mouth 2 (two) times daily. 60 tablet 3  . omeprazole (PRILOSEC OTC) 20 MG tablet Take 20 mg by mouth as needed.    [name] alprazolam (XANAX) 2 MG tablet Take 1 tablet (2 mg total) by mouth 3 (three) times daily as needed. 20 tablet 0  . venlafaxine XR (EFFEXOR-XR) 150 MG 24 hr capsule 1 cap bid for depression 180 capsule 3  . clotrimazole-betamethasone (LOTRISONE) cream Apply topically 2 (two) times daily. (Patient not taking: Reported on 07/15/2015) 30 g 3  . fluconazole (DIFLUCAN) 150 MG tablet Take 1 tablet (150 mg total) by mouth once. (Patient not taking: Reported on 07/15/2015) 1 tablet 1   No facility-administered medications prior to visit.    ROS Review of Systems  Constitutional:  Negative for chills, activity change, appetite change, fatigue and unexpected weight change.  HENT: Negative for congestion, mouth sores and sinus pressure.   Eyes: Negative for visual disturbance.  Respiratory: Negative for cough and chest tightness.   Cardiovascular: Positive for chest pain and palpitations.  Gastrointestinal: Negative for nausea and abdominal pain.  Genitourinary: Negative for frequency, difficulty urinating and vaginal pain.  Musculoskeletal: Negative for back pain, gait problem and neck pain.  Skin: Positive for rash. Negative for pallor.  Neurological: Negative for dizziness, tremors, weakness, numbness and headaches.  Psychiatric/Behavioral: Positive for sleep disturbance. Negative for suicidal ideas, confusion, dysphoric mood, decreased concentration and agitation. The patient is nervous/anxious.     Objective:  BP 140/98 mmHg  Pulse 98  Wt 172 lb (78.019 kg)  SpO2 97%  BP Readings from Last 3 Encounters:  07/15/15 140/98  06/20/15 144/90  06/19/15 159/105    Wt Readings from Last 3 Encounters:  07/15/15 172 lb (78.019 kg)  06/20/15 172 lb 12 oz (78.359 kg)  01/15/15 175 lb (79.379 kg)    Physical Exam  Constitutional: She appears well-developed. No distress.  HENT:  Head: Normocephalic.  Right Ear: External ear normal.  Left Ear: External ear normal.  Nose: Nose normal.  Mouth/Throat: Oropharynx is clear and moist.  Eyes: Conjunctivae are normal. Pupils are equal, round, and reactive to light. Right eye exhibits  no discharge. Left eye exhibits no discharge.  Neck: Normal range of motion. Neck supple. No JVD present. No tracheal deviation present. No thyromegaly present.  Cardiovascular: Normal rate, regular rhythm and normal heart sounds.   Pulmonary/Chest: No stridor. No respiratory distress. She has no wheezes.  Abdominal: Soft. Bowel sounds are normal. She exhibits no distension and no mass. There is no tenderness. There is no rebound and no  guarding.  Musculoskeletal: She exhibits tenderness. She exhibits no edema.  Lymphadenopathy:    She has no cervical adenopathy.  Neurological: She displays normal reflexes. No cranial nerve deficit. She exhibits normal muscle tone. Coordination normal.  Skin: Rash noted. No erythema.  Psychiatric: Her behavior is normal. Judgment and thought content normal.  2 erosions L neck Shoulders, neck NT w/ROM  Lab Results  Component Value Date   WBC 6.7 06/19/2015   HGB 13.9 06/19/2015   HCT 42.4 06/19/2015   PLT 311 06/19/2015   GLUCOSE 100* 06/19/2015   CHOL 160 01/24/2015   TRIG 255.0* 01/24/2015   HDL 40.90 01/24/2015   LDLDIRECT 69.0 01/24/2015   LDLCALC 71 11/22/2012   ALT 13 01/24/2015   AST 16 01/24/2015   NA 136 06/19/2015   K 3.8 06/19/2015   CL 104 06/19/2015   CREATININE 0.60 06/19/2015   BUN 17 06/19/2015   CO2 24 06/19/2015   TSH 1.27 01/24/2015    Dg Chest 2 View  06/19/2015  CLINICAL DATA:  Pain with 2 weeks of cough EXAM: CHEST  2 VIEW COMPARISON:  July 28, 2006 FINDINGS: The heart size and mediastinal contours are within normal limits. Both lungs are clear. The visualized skeletal structures are unremarkable. IMPRESSION: No active cardiopulmonary disease. Electronically Signed   By: Dorise Bullion III M.D   On: 06/19/2015 14:57    Assessment & Plan:   There are no diagnoses linked to this encounter. I have changed Ms. Cervini's alprazolam. I am also having her start on acyclovir, predniSONE, and HYDROcodone-acetaminophen. Additionally, I am having her maintain her clotrimazole-betamethasone, fish oil-omega-3 fatty acids, cyproheptadine, Vitamin B-12, CRESTOR, omeprazole, loratadine, Lorcaserin HCl, fluconazole, ergocalciferol, hydrochlorothiazide, butalbital-acetaminophen-caffeine, and venlafaxine XR.  Meds ordered this encounter  Medications  . alprazolam (XANAX) 2 MG tablet    Sig: Take 1 tablet (2 mg total) by mouth 3 (three) times daily as needed for  anxiety.    Dispense:  90 tablet    Refill:  3  . venlafaxine XR (EFFEXOR-XR) 150 MG 24 hr capsule    Sig: 1 cap bid for depression    Dispense:  180 capsule    Refill:  3  . acyclovir (ZOVIRAX) 800 MG tablet    Sig: Take 1 tablet (800 mg total) by mouth 5 (five) times daily.    Dispense:  35 tablet    Refill:  0  . predniSONE (DELTASONE) 10 MG tablet    Sig: Prednisone 10 mg: take 4 tabs a day x 3 days; then 3 tabs a day x 4 days; then 2 tabs a day x 4 days, then 1 tab a day x 6 days, then stop. Take pc.    Dispense:  38 tablet    Refill:  0  . HYDROcodone-acetaminophen (NORCO/VICODIN) 5-325 MG tablet    Sig: Take 1 tablet by mouth 2 (two) times daily as needed for moderate pain or severe pain.    Dispense:  60 tablet    Refill:  0     Follow-up: Return in about 6 weeks (around 08/26/2015) for  a follow-up visit.  Walker Kehr, MD

## 2015-07-15 NOTE — Assessment & Plan Note (Signed)
Mother died 05/03/14 Grief conceding - discussed

## 2015-07-31 ENCOUNTER — Other Ambulatory Visit: Payer: Self-pay

## 2015-07-31 ENCOUNTER — Telehealth: Payer: Self-pay

## 2015-07-31 MED ORDER — BUTALBITAL-APAP-CAFFEINE 50-325-40 MG PO TABS: ORAL_TABLET | ORAL | Status: DC

## 2015-07-31 NOTE — Telephone Encounter (Signed)
Recd faxed rx refill request for butalb/acetamin/caff tabs 50/325 [facility]----please advise, thanks

## 2015-07-31 NOTE — Telephone Encounter (Signed)
rx printed and waiting for plot to sign

## 2015-07-31 NOTE — Telephone Encounter (Signed)
OK to fill this prescription with additional refills x2 Thank you!  

## 2015-08-01 NOTE — Telephone Encounter (Signed)
[facility]ed [facility]

## 2015-11-10 ENCOUNTER — Encounter: Payer: Self-pay | Admitting: Internal Medicine

## 2015-11-11 MED ORDER — BUTALBITAL-APAP-CAFFEINE 50-325-40 MG PO TABS: ORAL_TABLET | ORAL | Status: DC

## 2015-11-11 NOTE — Telephone Encounter (Signed)
[facility].../lmb 

## 2015-12-15 ENCOUNTER — Other Ambulatory Visit: Payer: Self-pay | Admitting: Internal Medicine

## 2015-12-16 ENCOUNTER — Encounter: Payer: Self-pay | Admitting: Nurse Practitioner

## 2015-12-17 ENCOUNTER — Encounter: Payer: Self-pay | Admitting: Internal Medicine

## 2015-12-17 NOTE — Telephone Encounter (Signed)
Patient is needing alprazolam refill.  Patient will be out tomorrow.  Patient states that [facility] has been trying to get refilled and patient has been trying to get through on mychart.

## 2015-12-17 NOTE — Telephone Encounter (Signed)
Done. See meds. Left detailed mess informing pt.

## 2015-12-26 ENCOUNTER — Telehealth: Payer: BLUE CROSS/BLUE SHIELD | Admitting: Family Medicine

## 2015-12-26 DIAGNOSIS — J0120 Acute ethmoidal sinusitis, unspecified: Secondary | ICD-10-CM

## 2015-12-26 MED ORDER — PREDNISONE 10 MG PO TABS: ORAL_TABLET | ORAL | 0 refills | Status: DC

## 2015-12-26 MED ORDER — AMOXICILLIN-POT CLAVULANATE 875-125 MG PO TABS: 1.0000 | ORAL_TABLET | Freq: Two times a day (BID) | ORAL | 0 refills | Status: DC

## 2015-12-26 MED ORDER — BENZONATATE 100 MG PO CAPS: 100.0000 mg | ORAL_CAPSULE | Freq: Three times a day (TID) | ORAL | 0 refills | Status: DC | PRN

## 2015-12-26 MED ORDER — FLUCONAZOLE 150 MG PO TABS: 150.0000 mg | ORAL_TABLET | Freq: Once | ORAL | 0 refills | Status: AC

## 2015-12-26 NOTE — Progress Notes (Signed)
We are sorry that you are not feeling well.  Here is how we plan to help!  Based on what you have shared with me it looks like you have sinusitis.  Sinusitis is inflammation and infection in the sinus cavities of the head.  Based on your presentation I believe you most likely have Acute Viral Sinusitis.This is an infection most likely caused by a virus. There is not specific treatment for viral sinusitis other than to help you with the symptoms until the infection runs its course.  You may use an oral decongestant such as Mucinex D or if you have glaucoma or high blood pressure use plain Mucinex. Saline nasal spray help and can safely be used as often as needed for congestion, I have prescribed: Antibiotic/antiinflammatory/cough meds/yeast meds. Increase fluids.  Some authorities believe that zinc sprays or the use of Echinacea may shorten the course of your symptoms.  Sinus infections are not as easily transmitted as other respiratory infection, however we still recommend that you avoid close contact with loved ones, especially the very young and elderly.  Remember to wash your hands thoroughly throughout the day as this is the number one way to prevent the spread of infection!  Home Care:  Only take medications as instructed by your medical team.  Complete the entire course of an antibiotic.  Do not take these medications with alcohol.  A steam or ultrasonic humidifier can help congestion.  You can place a towel over your head and breathe in the steam from hot water coming from a faucet.  Avoid close contacts especially the very young and the elderly.  Cover your mouth when you cough or sneeze.  Always remember to wash your hands.  Get Help Right Away If:  You develop worsening fever or sinus pain.  You develop a severe head ache or visual changes.  Your symptoms persist after you have completed your treatment plan.  Make sure you  Understand these instructions.  Will watch your  condition.  Will get help right away if you are not doing well or get worse.  Your e-visit answers were reviewed by a board certified advanced clinical practitioner to complete your personal care plan.  Depending on the condition, your plan could have included both over the counter or prescription medications.  If there is a problem please reply  once you have received a response from your provider.  Your safety is important to [place].  If you have drug allergies check your prescription carefully.    You can use MyChart to ask questions about today's visit, request a non-urgent call back, or ask for a work or school excuse for 24 hours related to this e-Visit. If it has been greater than 24 hours you will need to follow up with your provider, or enter a new e-Visit to address those concerns.  You will get an e-mail in the next two days asking about your experience.  I hope that your e-visit has been valuable and will speed your recovery. Thank you for using e-visits.

## 2016-02-10 ENCOUNTER — Other Ambulatory Visit: Payer: Self-pay | Admitting: Internal Medicine

## 2016-02-11 MED ORDER — HYDROCODONE-ACETAMINOPHEN 5-325 MG PO TABS: 1.0000 | ORAL_TABLET | Freq: Two times a day (BID) | ORAL | 0 refills | Status: DC | PRN

## 2016-02-23 ENCOUNTER — Other Ambulatory Visit: Payer: Self-pay | Admitting: Internal Medicine

## 2016-02-23 ENCOUNTER — Encounter: Payer: Self-pay | Admitting: Internal Medicine

## 2016-02-25 MED ORDER — BUTALBITAL-APAP-CAFFEINE 50-325-40 MG PO TABS: 1.0000 | ORAL_TABLET | Freq: Four times a day (QID) | ORAL | 1 refills | Status: DC | PRN

## 2016-02-25 NOTE — Telephone Encounter (Signed)
Rx faxed to [facility].

## 2016-02-25 NOTE — Addendum Note (Signed)
Addended by: Cresenciano Lick on: 02/25/2016 08:28 AM   Modules accepted: Orders

## 2016-02-25 NOTE — Telephone Encounter (Signed)
Signed rx faxed to [facility]. See pt email.

## 2016-03-05 LAB — HM DEXA SCAN

## 2016-03-12 ENCOUNTER — Other Ambulatory Visit (INDEPENDENT_AMBULATORY_CARE_PROVIDER_SITE_OTHER): Payer: BLUE CROSS/BLUE SHIELD

## 2016-03-12 ENCOUNTER — Encounter: Payer: Self-pay | Admitting: Internal Medicine

## 2016-03-12 ENCOUNTER — Ambulatory Visit (INDEPENDENT_AMBULATORY_CARE_PROVIDER_SITE_OTHER): Payer: BLUE CROSS/BLUE SHIELD | Admitting: Internal Medicine

## 2016-03-12 VITALS — BP 148/90 | HR 98 | Temp 98.4°F | Ht 62.0 in | Wt 175.0 lb

## 2016-03-12 DIAGNOSIS — Z0000 Encounter for general adult medical examination without abnormal findings: Secondary | ICD-10-CM

## 2016-03-12 DIAGNOSIS — Z23 Encounter for immunization: Secondary | ICD-10-CM

## 2016-03-12 DIAGNOSIS — R51 Headache: Secondary | ICD-10-CM | POA: Diagnosis not present

## 2016-03-12 DIAGNOSIS — M25511 Pain in right shoulder: Secondary | ICD-10-CM

## 2016-03-12 DIAGNOSIS — R7989 Other specified abnormal findings of blood chemistry: Secondary | ICD-10-CM | POA: Diagnosis not present

## 2016-03-12 DIAGNOSIS — R519 Headache, unspecified: Secondary | ICD-10-CM

## 2016-03-12 DIAGNOSIS — M2550 Pain in unspecified joint: Secondary | ICD-10-CM | POA: Insufficient documentation

## 2016-03-12 DIAGNOSIS — Z0001 Encounter for general adult medical examination with abnormal findings: Secondary | ICD-10-CM | POA: Diagnosis not present

## 2016-03-12 DIAGNOSIS — I10 Essential (primary) hypertension: Secondary | ICD-10-CM | POA: Diagnosis not present

## 2016-03-12 DIAGNOSIS — M25519 Pain in unspecified shoulder: Secondary | ICD-10-CM | POA: Insufficient documentation

## 2016-03-12 DIAGNOSIS — F341 Dysthymic disorder: Secondary | ICD-10-CM

## 2016-03-12 LAB — CBC WITH DIFFERENTIAL/PLATELET
Basophils Absolute: 0.1 10*3/uL (ref 0.0–0.1)
Basophils Relative: 0.8 % (ref 0.0–3.0)
EOS PCT: 1.7 % (ref 0.0–5.0)
Eosinophils Absolute: 0.2 10*3/uL (ref 0.0–0.7)
HEMATOCRIT: 45.6 % (ref 36.0–46.0)
HEMOGLOBIN: 15.3 g/dL — AB (ref 12.0–15.0)
LYMPHS PCT: 23.6 % (ref 12.0–46.0)
Lymphs Abs: 2.3 10*3/uL (ref 0.7–4.0)
MCHC: 33.7 g/dL (ref 30.0–36.0)
MCV: 87.2 fl (ref 78.0–100.0)
MONO ABS: 0.9 10*3/uL (ref 0.1–1.0)
MONOS PCT: 9.5 % (ref 3.0–12.0)
Neutro Abs: 6.2 10*3/uL (ref 1.4–7.7)
Neutrophils Relative %: 64.4 % (ref 43.0–77.0)
Platelets: 364 10*3/uL (ref 150.0–400.0)
RBC: 5.23 Mil/uL — AB (ref 3.87–5.11)
RDW: 13.4 % (ref 11.5–15.5)
WBC: 9.6 10*3/uL (ref 4.0–10.5)

## 2016-03-12 LAB — HEPATIC FUNCTION PANEL
ALT: 17 U/L (ref 0–35)
AST: 18 U/L (ref 0–37)
Albumin: 4.4 g/dL (ref 3.5–5.2)
Alkaline Phosphatase: 147 U/L — ABNORMAL HIGH (ref 39–117)
BILIRUBIN TOTAL: 0.4 mg/dL (ref 0.2–1.2)
Bilirubin, Direct: 0.1 mg/dL (ref 0.0–0.3)
TOTAL PROTEIN: 7.9 g/dL (ref 6.0–8.3)

## 2016-03-12 LAB — BASIC METABOLIC PANEL
BUN: 9 mg/dL (ref 6–23)
CHLORIDE: 101 meq/L (ref 96–112)
CO2: 29 mEq/L (ref 19–32)
Calcium: 10 mg/dL (ref 8.4–10.5)
Creatinine, Ser: 0.66 mg/dL (ref 0.40–1.20)
GFR: 95.94 mL/min (ref >60.00)
Glucose, Bld: 95 mg/dL (ref 70–99)
POTASSIUM: 4.3 meq/L (ref 3.5–5.1)
SODIUM: 140 meq/L (ref 135–145)

## 2016-03-12 LAB — URINALYSIS
BILIRUBIN URINE: NEGATIVE
KETONES UR: NEGATIVE
LEUKOCYTES UA: NEGATIVE
Nitrite: NEGATIVE
PH: 6 (ref 5.0–8.0)
Specific Gravity, Urine: 1.01 (ref 1.000–1.030)
TOTAL PROTEIN, URINE-UPE24: NEGATIVE
Urine Glucose: NEGATIVE
Urobilinogen, UA: 0.2 (ref 0.0–1.0)

## 2016-03-12 LAB — SEDIMENTATION RATE: Sed Rate: 10 mm/hr (ref 0–30)

## 2016-03-12 LAB — LIPID PANEL
CHOL/HDL RATIO: 5
CHOLESTEROL: 258 mg/dL — AB (ref 0–200)
HDL: 48.4 mg/dL (ref >39.00)
NONHDL: 209.38
Triglycerides: 323 mg/dL — ABNORMAL HIGH (ref 0.0–149.0)
VLDL: 64.6 mg/dL — ABNORMAL HIGH (ref 0.0–40.0)

## 2016-03-12 LAB — TSH: TSH: 2.18 u[IU]/mL (ref 0.35–4.50)

## 2016-03-12 LAB — VITAMIN B12: VITAMIN B 12: 377 pg/mL (ref 211–911)

## 2016-03-12 LAB — LDL CHOLESTEROL, DIRECT: Direct LDL: 128 mg/dL

## 2016-03-12 MED ORDER — VENLAFAXINE HCL ER 150 MG PO CP24: 150.0000 mg | ORAL_CAPSULE | Freq: Every day | ORAL | 3 refills | Status: DC

## 2016-03-12 MED ORDER — MELOXICAM 7.5 MG PO TABS: 7.5000 mg | ORAL_TABLET | Freq: Every day | ORAL | 0 refills | Status: DC

## 2016-03-12 MED ORDER — OMEPRAZOLE MAGNESIUM 20 MG PO TBEC: 20.0000 mg | DELAYED_RELEASE_TABLET | ORAL | 3 refills | Status: DC | PRN

## 2016-03-12 MED ORDER — HYDROCHLOROTHIAZIDE 12.5 MG PO CAPS: 12.5000 mg | ORAL_CAPSULE | Freq: Every day | ORAL | 3 refills | Status: DC

## 2016-03-12 MED ORDER — DOXEPIN HCL 6 MG PO TABS: 6.0000 mg | ORAL_TABLET | Freq: Every day | ORAL | 5 refills | Status: DC

## 2016-03-12 MED ORDER — ALPRAZOLAM 2 MG PO TABS: ORAL_TABLET | ORAL | 3 refills | Status: DC

## 2016-03-12 NOTE — Progress Notes (Signed)
Pre visit review using our clinic review tool, if applicable. No additional management support is needed unless otherwise documented below in the visit note. 

## 2016-03-12 NOTE — Progress Notes (Signed)
Subjective:  Patient ID: Julia Zimmerman, female    DOB: [birth year]  Age: 65 y.o. MRN: [id number]  CC: Annual Exam   HPI Julia Zimmerman presents for a well exam C/o aches and pains all the time: worse in hands and R shoulder pain. Off Crestor x 4 mo C/o insomnia; lack of focus F/u anxiety  Outpatient Medications Prior to Visit  Medication Sig Dispense Refill  . acyclovir (ZOVIRAX) 800 MG tablet Take 1 tablet (800 mg total) by mouth 5 (five) times daily. 35 tablet 0  . alprazolam (XANAX) 2 MG tablet take 1 tablet by mouth three times a day if needed for anxiety 90 tablet 3  . butalbital-acetaminophen-caffeine (FIORICET, ESGIC) 50-325-40 MG tablet Take 1 tablet by mouth every 6 (six) hours as needed for headache. 90 tablet 1  . cyproheptadine (PERIACTIN) 4 MG tablet Take 1 tablet (4 mg total) by mouth 3 (three) times daily. 180 tablet 3  . hydrochlorothiazide (MICROZIDE) 12.5 MG capsule Take 1 capsule (12.5 mg total) by mouth daily. 30 capsule 1  . omeprazole (PRILOSEC OTC) 20 MG tablet Take 20 mg by mouth as needed.    . venlafaxine XR (EFFEXOR-XR) 150 MG 24 hr capsule 1 cap bid for depression 180 capsule 3  . amoxicillin-clavulanate (AUGMENTIN) 875-125 MG tablet Take 1 tablet by mouth 2 (two) times daily. 20 tablet 0  . predniSONE (DELTASONE) 10 MG tablet take 4 tabs a day x 3 days; then 3 tabs a day x 4 days; then 2 tabs a day x 4 days, then 1 tab a day x 6 days, then stop. Take pc. 38 tablet 0  . benzonatate (TESSALON PERLES) 100 MG capsule Take 1 capsule (100 mg total) by mouth 3 (three) times daily as needed for cough. (Patient not taking: Reported on 03/12/2016) 20 capsule 0  . clotrimazole-betamethasone (LOTRISONE) cream Apply topically 2 (two) times daily. (Patient not taking: Reported on 03/12/2016) 30 g 3  . CRESTOR 5 MG tablet TAKE 1 TABLET DAILY (Patient not taking: Reported on 03/12/2016) 90 tablet 3  . Cyanocobalamin (VITAMIN B-12) 500 MCG SUBL 1 sl qd (Patient not taking:  Reported on 03/12/2016) 150 tablet   . ergocalciferol (VITAMIN D2) 50000 UNITS capsule Take 1 capsule (50,000 Units total) by mouth once a week. (Patient not taking: Reported on 03/12/2016) 6 capsule 0  . fish oil-omega-3 fatty acids 1000 MG capsule Take 2 capsules (2 g total) by mouth daily. (Patient not taking: Reported on 03/12/2016) 90 capsule 3  . fluconazole (DIFLUCAN) 150 MG tablet Take 1 tablet (150 mg total) by mouth once. (Patient not taking: Reported on 03/12/2016) 1 tablet 1  . HYDROcodone-acetaminophen (NORCO/VICODIN) 5-325 MG tablet Take 1 tablet by mouth 2 (two) times daily as needed for moderate pain or severe pain. (Patient not taking: Reported on 03/12/2016) 60 tablet 0  . loratadine (CLARITIN) 10 MG tablet Take 1 tablet (10 mg total) by mouth daily. (Patient not taking: Reported on 03/12/2016) 100 tablet 3  . Lorcaserin HCl (BELVIQ) 10 MG TABS Take 1 tablet by mouth 2 (two) times daily. (Patient not taking: Reported on 03/12/2016) 60 tablet 3   No facility-administered medications prior to visit.     ROS Review of Systems  Constitutional: Positive for fatigue. Negative for activity change, appetite change, chills and unexpected weight change.  HENT: Negative for congestion, mouth sores and sinus pressure.   Eyes: Negative for visual disturbance.  Respiratory: Negative for cough and chest tightness.   Gastrointestinal: Negative  for abdominal pain and nausea.  Genitourinary: Negative for difficulty urinating, frequency and vaginal pain.  Musculoskeletal: Positive for arthralgias, back pain, neck pain and neck stiffness. Negative for gait problem and joint swelling.  Skin: Negative for pallor and rash.  Neurological: Negative for dizziness, tremors, weakness, numbness and headaches.  Psychiatric/Behavioral: Positive for sleep disturbance. Negative for confusion.    Objective:  BP (!) 148/90   Pulse 98   Temp 98.4 F (36.9 C) (Oral)   Ht 5\' 2"  (1.575 m)   Wt 175 lb (79.4  kg)   SpO2 97%   BMI 32.01 kg/m   BP Readings from Last 3 Encounters:  03/12/16 (!) 148/90  07/15/15 (!) 140/98  06/20/15 (!) 144/90    Wt Readings from Last 3 Encounters:  03/12/16 175 lb (79.4 kg)  07/15/15 172 lb (78 kg)  06/20/15 172 lb 12 oz (78.4 kg)    Physical Exam  Constitutional: She appears well-developed. No distress.  HENT:  Head: Normocephalic.  Right Ear: External ear normal.  Left Ear: External ear normal.  Nose: Nose normal.  Mouth/Throat: Oropharynx is clear and moist.  Eyes: Conjunctivae are normal. Pupils are equal, round, and reactive to light. Right eye exhibits no discharge. Left eye exhibits no discharge.  Neck: Normal range of motion. Neck supple. No JVD present. No tracheal deviation present. No thyromegaly present.  Cardiovascular: Normal rate, regular rhythm and normal heart sounds.   Pulmonary/Chest: No stridor. No respiratory distress. She has no wheezes.  Abdominal: Soft. Bowel sounds are normal. She exhibits no distension and no mass. There is no tenderness. There is no rebound and no guarding.  Musculoskeletal: She exhibits tenderness. She exhibits no edema.  Lymphadenopathy:    She has no cervical adenopathy.  Neurological: She displays normal reflexes. No cranial nerve deficit. She exhibits normal muscle tone. Coordination normal.  Skin: No rash noted. No erythema.  Psychiatric: She has a normal mood and affect. Her behavior is normal. Judgment and thought content normal.   R shoulder is tender w/ROM  Lab Results  Component Value Date   WBC 6.7 06/19/2015   HGB 13.9 06/19/2015   HCT 42.4 06/19/2015   PLT 311 06/19/2015   GLUCOSE 100 (H) 06/19/2015   CHOL 160 01/24/2015   TRIG 255.0 (H) 01/24/2015   HDL 40.90 01/24/2015   LDLDIRECT 69.0 01/24/2015   LDLCALC 71 11/22/2012   ALT 13 01/24/2015   AST 16 01/24/2015   NA 136 06/19/2015   K 3.8 06/19/2015   CL 104 06/19/2015   CREATININE 0.60 06/19/2015   BUN 17 06/19/2015   CO2 24  06/19/2015   TSH 1.27 01/24/2015    Dg Chest 2 View  Result Date: 06/19/2015 CLINICAL DATA:  Pain with 2 weeks of cough EXAM: CHEST  2 VIEW COMPARISON:  July 28, 2006 FINDINGS: The heart size and mediastinal contours are within normal limits. Both lungs are clear. The visualized skeletal structures are unremarkable. IMPRESSION: No active cardiopulmonary disease. Electronically Signed   By: Dorise Bullion III M.D   On: 06/19/2015 14:57    Assessment & Plan:   There are no diagnoses linked to this encounter. I have discontinued Ms. Monterosso's predniSONE and amoxicillin-clavulanate. I am also having her maintain her clotrimazole-betamethasone, fish oil-omega-3 fatty acids, cyproheptadine, Vitamin B-12, CRESTOR, omeprazole, loratadine, Lorcaserin HCl, fluconazole, ergocalciferol, hydrochlorothiazide, venlafaxine XR, acyclovir, alprazolam, benzonatate, HYDROcodone-acetaminophen, and butalbital-acetaminophen-caffeine.  No orders of the defined types were placed in this encounter.    Follow-up: No Follow-up on file.  Walker Kehr, MD

## 2016-03-12 NOTE — Assessment & Plan Note (Addendum)
Diffuse   Meloxicam prn Labs

## 2016-03-12 NOTE — Assessment & Plan Note (Addendum)
Fioricet  Potential benefits of a long term Fioricet use as well as potential risks  and complications were explained to the patient and were aknowledged. Treat HTN, insomnia

## 2016-03-12 NOTE — Assessment & Plan Note (Signed)
Restart HCTZ 

## 2016-03-12 NOTE — Assessment & Plan Note (Signed)
[name] added at [facility]

## 2016-03-12 NOTE — Assessment & Plan Note (Signed)
Meloxicam ROM exercises Injection options discussed

## 2016-03-12 NOTE — Assessment & Plan Note (Signed)
We discussed age appropriate health related issues, including available/recomended screening tests and vaccinations. We discussed a need for adhering to healthy diet and exercise. Labs/EKG were reviewed/ordered. All questions were answered.   

## 2016-03-13 LAB — HEPATITIS C ANTIBODY: HCV AB: NEGATIVE

## 2016-03-18 ENCOUNTER — Encounter: Payer: Self-pay | Admitting: Internal Medicine

## 2016-04-23 ENCOUNTER — Encounter: Payer: Self-pay | Admitting: Internal Medicine

## 2016-04-23 ENCOUNTER — Ambulatory Visit (INDEPENDENT_AMBULATORY_CARE_PROVIDER_SITE_OTHER): Payer: BLUE CROSS/BLUE SHIELD | Admitting: Internal Medicine

## 2016-04-23 VITALS — BP 138/82 | HR 106 | Wt 177.0 lb

## 2016-04-23 DIAGNOSIS — I10 Essential (primary) hypertension: Secondary | ICD-10-CM

## 2016-04-23 DIAGNOSIS — F341 Dysthymic disorder: Secondary | ICD-10-CM | POA: Diagnosis not present

## 2016-04-23 DIAGNOSIS — F5101 Primary insomnia: Secondary | ICD-10-CM | POA: Diagnosis not present

## 2016-04-23 DIAGNOSIS — R002 Palpitations: Secondary | ICD-10-CM

## 2016-04-23 DIAGNOSIS — G4700 Insomnia, unspecified: Secondary | ICD-10-CM | POA: Insufficient documentation

## 2016-04-23 NOTE — Assessment & Plan Note (Addendum)
S tachy Car ref

## 2016-04-23 NOTE — Progress Notes (Signed)
Pre visit review using our clinic review tool, if applicable. No additional management support is needed unless otherwise documented below in the visit note. 

## 2016-04-23 NOTE — Progress Notes (Signed)
Subjective:  Patient ID: Julia Zimmerman, female    DOB: [birth year]  Age: 65 y.o. MRN: [id number]  CC: No chief complaint on file.   HPI Julia Zimmerman presents for shoulder pain - better, HTN, anxiety f/u C/o palpitations x long time off and on F/u insomnia - better   Outpatient Medications Prior to Visit  Medication Sig Dispense Refill  . alprazolam (XANAX) 2 MG tablet take 1 tablet by mouth three times a day if needed for anxiety 90 tablet 3  . butalbital-acetaminophen-caffeine (FIORICET, ESGIC) 50-325-40 MG tablet Take 1 tablet by mouth every 6 (six) hours as needed for headache. 90 tablet 1  . clotrimazole-betamethasone (LOTRISONE) cream Apply topically 2 (two) times daily. 30 g 3  . CRESTOR 5 MG tablet TAKE 1 TABLET DAILY 90 tablet 3  . Cyanocobalamin (VITAMIN B-12) 500 MCG SUBL 1 sl qd 150 tablet   . cyproheptadine (PERIACTIN) 4 MG tablet Take 1 tablet (4 mg total) by mouth 3 (three) times daily. 180 tablet 3  . Doxepin HCl 6 MG TABS Take 1 tablet (6 mg total) by mouth at bedtime. 30 tablet 5  . ergocalciferol (VITAMIN D2) 50000 UNITS capsule Take 1 capsule (50,000 Units total) by mouth once a week. 6 capsule 0  . fish oil-omega-3 fatty acids 1000 MG capsule Take 2 capsules (2 g total) by mouth daily. 90 capsule 3  . fluconazole (DIFLUCAN) 150 MG tablet Take 1 tablet (150 mg total) by mouth once. 1 tablet 1  . hydrochlorothiazide (MICROZIDE) 12.5 MG capsule Take 1 capsule (12.5 mg total) by mouth daily. 90 capsule 3  . loratadine (CLARITIN) 10 MG tablet Take 1 tablet (10 mg total) by mouth daily. 100 tablet 3  . meloxicam (MOBIC) 7.5 MG tablet Take 1-2 tablets (7.5-15 mg total) by mouth daily. 90 tablet 0  . omeprazole (PRILOSEC OTC) 20 MG tablet Take 1 tablet (20 mg total) by mouth as needed. 90 tablet 3  . venlafaxine XR (EFFEXOR-XR) 150 MG 24 hr capsule Take 1 capsule (150 mg total) by mouth daily with breakfast. 1 cap bid for depression 90 capsule 3   No  facility-administered medications prior to visit.     ROS Review of Systems  Constitutional: Negative for activity change, appetite change, chills, fatigue and unexpected weight change.  HENT: Negative for congestion, mouth sores and sinus pressure.   Eyes: Negative for visual disturbance.  Respiratory: Positive for shortness of breath. Negative for cough, chest tightness and wheezing.   Cardiovascular: Positive for palpitations. Negative for chest pain.  Gastrointestinal: Negative for abdominal pain and nausea.  Genitourinary: Negative for difficulty urinating, frequency and vaginal pain.  Musculoskeletal: Negative for back pain and gait problem.  Skin: Negative for pallor and rash.  Neurological: Negative for dizziness, tremors, weakness, numbness and headaches.  Psychiatric/Behavioral: Negative for confusion and sleep disturbance.    Objective:  BP 138/82   Pulse (!) 106   Wt 177 lb (80.3 kg)   SpO2 97%   BMI 32.37 kg/m   BP Readings from Last 3 Encounters:  04/23/16 138/82  03/12/16 (!) 148/90  07/15/15 (!) 140/98    Wt Readings from Last 3 Encounters:  04/23/16 177 lb (80.3 kg)  03/12/16 175 lb (79.4 kg)  07/15/15 172 lb (78 kg)    Physical Exam  Constitutional: She appears well-developed. No distress.  HENT:  Head: Normocephalic.  Right Ear: External ear normal.  Left Ear: External ear normal.  Nose: Nose normal.  Mouth/Throat: Oropharynx is  clear and moist.  Eyes: Conjunctivae are normal. Pupils are equal, round, and reactive to light. Right eye exhibits no discharge. Left eye exhibits no discharge.  Neck: Normal range of motion. Neck supple. No JVD present. No tracheal deviation present. No thyromegaly present.  Cardiovascular: Regular rhythm and normal heart sounds.   Pulmonary/Chest: No stridor. No respiratory distress. She has no wheezes.  Abdominal: Soft. Bowel sounds are normal. She exhibits no distension and no mass. There is no tenderness. There is no  rebound and no guarding.  Musculoskeletal: She exhibits no edema or tenderness.  Lymphadenopathy:    She has no cervical adenopathy.  Neurological: She displays normal reflexes. No cranial nerve deficit. She exhibits normal muscle tone. Coordination normal.  Skin: No rash noted. No erythema.  Psychiatric: She has a normal mood and affect. Her behavior is normal. Judgment and thought content normal.  tachycardic  Lab Results  Component Value Date   WBC 9.6 03/12/2016   HGB 15.3 (H) 03/12/2016   HCT 45.6 03/12/2016   PLT 364.0 03/12/2016   GLUCOSE 95 03/12/2016   CHOL 258 (H) 03/12/2016   TRIG 323.0 (H) 03/12/2016   HDL 48.40 03/12/2016   LDLDIRECT 128.0 03/12/2016   LDLCALC 71 11/22/2012   ALT 17 03/12/2016   AST 18 03/12/2016   NA 140 03/12/2016   K 4.3 03/12/2016   CL 101 03/12/2016   CREATININE 0.66 03/12/2016   BUN 9 03/12/2016   CO2 29 03/12/2016   TSH 2.18 03/12/2016    Dg Chest 2 View  Result Date: 06/19/2015 CLINICAL DATA:  Pain with 2 weeks of cough EXAM: CHEST  2 VIEW COMPARISON:  July 28, 2006 FINDINGS: The heart size and mediastinal contours are within normal limits. Both lungs are clear. The visualized skeletal structures are unremarkable. IMPRESSION: No active cardiopulmonary disease. Electronically Signed   By: Dorise Bullion III M.D   On: 06/19/2015 14:57    Assessment & Plan:   There are no diagnoses linked to this encounter. I am having [name] maintain her clotrimazole-betamethasone, fish oil-omega-3 fatty acids, cyproheptadine, Vitamin B-12, CRESTOR, loratadine, fluconazole, ergocalciferol, butalbital-acetaminophen-caffeine, hydrochlorothiazide, alprazolam, venlafaxine XR, omeprazole, Doxepin HCl, and meloxicam.  No orders of the defined types were placed in this encounter.    Follow-up: No Follow-up on file.  Walker Kehr, MD

## 2016-04-23 NOTE — Assessment & Plan Note (Signed)
Xanax prn Doxepine at hs  Potential benefits of a long term benzodiazepines  use as well as potential risks  and complications were explained to the patient and were aknowledged. Effexor XR

## 2016-04-23 NOTE — Assessment & Plan Note (Signed)
Better on HCTZ

## 2016-04-23 NOTE — Assessment & Plan Note (Signed)
Better on Doxepin

## 2016-05-28 ENCOUNTER — Encounter: Payer: Self-pay | Admitting: Internal Medicine

## 2016-05-28 ENCOUNTER — Ambulatory Visit (INDEPENDENT_AMBULATORY_CARE_PROVIDER_SITE_OTHER): Payer: BLUE CROSS/BLUE SHIELD | Admitting: Internal Medicine

## 2016-05-28 VITALS — BP 148/80 | HR 75 | Ht 62.0 in | Wt 182.0 lb

## 2016-05-28 DIAGNOSIS — R0600 Dyspnea, unspecified: Secondary | ICD-10-CM | POA: Diagnosis not present

## 2016-05-28 DIAGNOSIS — I10 Essential (primary) hypertension: Secondary | ICD-10-CM | POA: Diagnosis not present

## 2016-05-28 DIAGNOSIS — R002 Palpitations: Secondary | ICD-10-CM

## 2016-05-28 DIAGNOSIS — R0602 Shortness of breath: Secondary | ICD-10-CM | POA: Diagnosis not present

## 2016-05-28 MED ORDER — LOSARTAN POTASSIUM 50 MG PO TABS: 50.0000 mg | ORAL_TABLET | Freq: Every day | ORAL | 3 refills | Status: DC

## 2016-05-28 MED ORDER — LOSARTAN POTASSIUM 50 MG PO TABS: 50.0000 mg | ORAL_TABLET | Freq: Every day | ORAL | 11 refills | Status: DC

## 2016-05-28 NOTE — Patient Instructions (Signed)
Your physician has recommended you make the following change in your medication:  1.) start cozaar 50 mg once a day for blood pressure  Your physician recommends that you return for lab work in 10 days for BMET  Your physician has recommended that you wear an event monitor. Event monitors are medical devices that record the heart's electrical activity. Doctors most often [place] these monitors to diagnose arrhythmias. Arrhythmias are problems with the speed or rhythm of the heartbeat. The monitor is a small, portable device. You can wear one while you do your normal daily activities. This is usually used to diagnose what is causing palpitations/syncope (passing out).  Your physician has requested that you have an echocardiogram. Echocardiography is a painless test that uses sound waves to create images of your heart. It provides your doctor with information about the size and shape of your heart and how well your heart's chambers and valves are working. This procedure takes approximately one hour. There are no restrictions for this procedure.  Your physician recommends that you schedule a follow-up appointment in: 6 weeks with Dr. Harrington Challenger.

## 2016-05-28 NOTE — Progress Notes (Signed)
Cardiology Office Note   Date:  05/28/2016   ID:  Julia Zimmerman, DOB 09-27-1950, MRN [id number]  PCP:  Walker Kehr, MD  Cardiologist:   Dorris Carnes, MD   Pt referred for SOB and palpitations    History of Present Illness: Julia Zimmerman is a 66 y.o. female with a history of shoulder pain Also a history of palpitations and HTN   Seen in primary care Pt says she can feel and hear her heart racing  Happens a few times per day  Lasts about 5 to 10 min  No dizziness Usually occurs in evening while watching TV  Will feel throbbing in ears Goes away on own  No SOB   Activity limited by knee issues esp L knee No exercise   SOB with walking  Has been going on for awhile    Has been on HCTZ for years     Current Meds  Medication Sig  . alprazolam (XANAX) 2 MG tablet take 1 tablet by mouth three times a day if needed for anxiety  . butalbital-acetaminophen-caffeine (FIORICET, ESGIC) 50-325-40 MG tablet Take 1 tablet by mouth every 6 (six) hours as needed for headache.  . clotrimazole-betamethasone (LOTRISONE) cream Apply topically 2 (two) times daily.  . CRESTOR 5 MG tablet TAKE 1 TABLET DAILY  . Cyanocobalamin (VITAMIN B-12) 500 MCG SUBL 1 sl qd  . cyproheptadine (PERIACTIN) 4 MG tablet Take 1 tablet (4 mg total) by mouth 3 (three) times daily.  . Doxepin HCl 6 MG TABS Take 1 tablet (6 mg total) by mouth at bedtime.  . ergocalciferol (VITAMIN D2) 50000 UNITS capsule Take 1 capsule (50,000 Units total) by mouth once a week.  . fish oil-omega-3 fatty acids 1000 MG capsule Take 2 capsules (2 g total) by mouth daily.  . fluconazole (DIFLUCAN) 150 MG tablet Take 1 tablet (150 mg total) by mouth once.  . hydrochlorothiazide (MICROZIDE) 12.5 MG capsule Take 1 capsule (12.5 mg total) by mouth daily.  [name] loratadine (CLARITIN) 10 MG tablet Take 1 tablet (10 mg total) by mouth daily.  . meloxicam (MOBIC) 7.5 MG tablet Take 1-2 tablets (7.5-15 mg total) by mouth daily.  [name] omeprazole  (PRILOSEC OTC) 20 MG tablet Take 1 tablet (20 mg total) by mouth as needed.  . venlafaxine XR (EFFEXOR-XR) 150 MG 24 hr capsule Take 1 capsule (150 mg total) by mouth daily with breakfast. 1 cap bid for depression     Allergies:   Sertraline hcl; Hydrocodone; Sulfa antibiotics; Sulfamethoxazole; and Codeine   Past Medical History:  Diagnosis Date  . ADD 10/30/2008   Qualifier: Diagnosis of  By: Niel Hummer MD, Lorinda Creed Anxiety   . ANXIETY DEPRESSION 03/29/2007   Qualifier: Diagnosis of  By: Niel Hummer MD, Lorinda Creed   . Cough 04/21/2010   full eval including allergist. Working diagnosis - reflux.  . Depression   . Endometriosis   . Episodic tension type headache 11/07/2007   Qualifier: Diagnosis of  By: Niel Hummer MD, Lorinda Creed   . Essential hypertension, benign 05/15/2010   Qualifier: Diagnosis of  By: Elizebeth Koller MD, Mora Appl   . GERD 04/21/2010   Qualifier: Diagnosis of  By: Niel Hummer MD, Lorinda Creed   . GERD (gastroesophageal reflux disease)   . Hyperlipidemia   . HYPERLIPIDEMIA 05/15/2010   Qualifier: Diagnosis of  By: Elizebeth Koller MD, Mora Appl   . Learning disability    poor attention span and difficulty with retention  .  ONYCHOMYCOSIS 10/30/2008   successfully treated with lamisil.  [name] RHINITIS 04/21/2010   Qualifier: Diagnosis of  By: Niel Hummer MD, Lorinda Creed     Past Surgical History:  Procedure Laterality Date  . G0P0    . LAPAROSCOPY  1994   x 2 for endometriosis  . LAPAROTOMY  1989   for benign tumor  . NECK MASS EXCISION  1974   benign mass left     Social History:  The patient  reports that she quit smoking about 25 years ago. Her smoking use included Cigarettes. She has a 28.50 pack-year smoking history. She has never used smokeless tobacco. She reports that she does not drink alcohol or use drugs.   Family History:  The patient's family history includes Arthritis in her father; Diabetes in her mother; Heart disease in her sister; Hypertension in her mother.     ROS:  Please see the history of present illness. All other systems are reviewed and  Negative to the above problem except as noted.    PHYSICAL EXAM: VS:  BP (!) 148/80 (BP Location: Right Arm, Patient Position: Sitting, Cuff Size: Normal)   Pulse 75   Ht 5\' 2"  (1.575 m)   Wt 182 lb (82.6 kg)   SpO2 97%   BMI 33.29 kg/m   GEN: Well nourished, well developed, in no acute distress  HEENT: normal  Neck: no JVD, carotid bruits, or masses Cardiac: RRR; no murmurs, rubs, or gallops,no edema  Respiratory:  clear to auscultation bilaterally, normal work of breathing GI: soft, nontender, nondistended, + BS  No hepatomegaly  MS: no deformity Moving all extremities   Skin: warm and dry, no rash Neuro:  Strength and sensation are intact Psych: euthymic mood, full affect   EKG:  EKG is ordered today.SR 75 bpm  LBBB     Lipid Panel    Component Value Date/Time   CHOL 258 (H) 03/12/2016 1539   TRIG 323.0 (H) 03/12/2016 1539   TRIG 235 (HH) 04/12/2006 1440   HDL 48.40 03/12/2016 1539   CHOLHDL 5 03/12/2016 1539   VLDL 64.6 (H) 03/12/2016 1539   LDLCALC 71 11/22/2012 1616   LDLDIRECT 128.0 03/12/2016 1539      Wt Readings from Last 3 Encounters:  05/28/16 182 lb (82.6 kg)  04/23/16 177 lb (80.3 kg)  03/12/16 175 lb (79.4 kg)      ASSESSMENT AND PLAN:  1  HTN BP is high  160/92 L arm    Add Cozaar 50  BMET in 10 days  F/U 6 wks  2  Plapitations  Set up for event monitor  3  SOB  Will set up for echo  With EKG if EF normal will prob need myovue     Current medicines are reviewed at length with the patient today.  The patient does not have concerns regarding medicines.  Signed, Dorris Carnes, MD  05/28/2016 2:48 PM    [address], [place], [place]  [address] Phone: [phone]; Fax: [phone]

## 2016-06-07 ENCOUNTER — Encounter: Payer: Self-pay | Admitting: Internal Medicine

## 2016-06-09 ENCOUNTER — Ambulatory Visit (INDEPENDENT_AMBULATORY_CARE_PROVIDER_SITE_OTHER): Payer: BLUE CROSS/BLUE SHIELD

## 2016-06-09 ENCOUNTER — Other Ambulatory Visit: Payer: BLUE CROSS/BLUE SHIELD | Admitting: *Deleted

## 2016-06-09 DIAGNOSIS — R0602 Shortness of breath: Secondary | ICD-10-CM | POA: Diagnosis not present

## 2016-06-09 DIAGNOSIS — R002 Palpitations: Secondary | ICD-10-CM

## 2016-06-10 LAB — BASIC METABOLIC PANEL
BUN/Creatinine Ratio: 20 (ref 12–28)
BUN: 13 mg/dL (ref 8–27)
CALCIUM: 9.3 mg/dL (ref 8.7–10.3)
CO2: 25 mmol/L (ref 18–29)
Chloride: 98 mmol/L (ref 96–106)
Creatinine, Ser: 0.64 mg/dL (ref 0.57–1.00)
GFR calc Af Amer: 110 mL/min/{1.73_m2} (ref >59)
GFR calc non Af Amer: 95 mL/min/{1.73_m2} (ref >59)
GLUCOSE: 99 mg/dL (ref 65–99)
POTASSIUM: 3.5 mmol/L (ref 3.5–5.2)
SODIUM: 139 mmol/L (ref 134–144)

## 2016-06-11 ENCOUNTER — Ambulatory Visit (HOSPITAL_COMMUNITY): Payer: BLUE CROSS/BLUE SHIELD | Attending: Internal Medicine

## 2016-06-11 ENCOUNTER — Other Ambulatory Visit: Payer: Self-pay

## 2016-06-11 DIAGNOSIS — R002 Palpitations: Secondary | ICD-10-CM | POA: Diagnosis not present

## 2016-06-11 DIAGNOSIS — R0602 Shortness of breath: Secondary | ICD-10-CM | POA: Diagnosis not present

## 2016-06-11 DIAGNOSIS — I340 Nonrheumatic mitral (valve) insufficiency: Secondary | ICD-10-CM | POA: Insufficient documentation

## 2016-06-11 DIAGNOSIS — I348 Other nonrheumatic mitral valve disorders: Secondary | ICD-10-CM | POA: Insufficient documentation

## 2016-06-14 ENCOUNTER — Telehealth: Payer: Self-pay | Admitting: Physician Assistant

## 2016-06-14 NOTE — Telephone Encounter (Signed)
Informed pt of lab results. Pt verbalized understanding. 

## 2016-06-14 NOTE — Telephone Encounter (Signed)
New message    Pt is calling, returning call from Friday about lab results.

## 2016-06-23 ENCOUNTER — Telehealth: Payer: Self-pay | Admitting: Internal Medicine

## 2016-06-23 DIAGNOSIS — R0602 Shortness of breath: Secondary | ICD-10-CM

## 2016-06-23 NOTE — Telephone Encounter (Signed)
-----   Message from Fay Records, MD sent at 06/15/2016 11:18 AM EST ----- Echo shows normal pumping function of the heart.  Relaxation of the heart is mildly impaired  Not uncommon at 61 I would recomm stress myovue to eval if SOB due to blood supply problem to heart.  Alos eval exercise toleratnce and BP response

## 2016-06-23 NOTE — Telephone Encounter (Signed)
Spoke with patient.  Explained echo results and recommendation for myoview.  Explained stress test instructions.  Asked her to try to schedule prior to next OV on 07/09/16.

## 2016-06-23 NOTE — Telephone Encounter (Signed)
Patient states she received a voicemail asking her to call back to discuss her echo results. Thanks.

## 2016-06-24 ENCOUNTER — Encounter: Payer: Self-pay | Admitting: Internal Medicine

## 2016-06-25 NOTE — Telephone Encounter (Signed)
Patient is currently out of this medication.  She requested this medication on 2/5 to go to [facility] but for right now she would [facility] [address] so she can pick up this weekend.  Thanks!

## 2016-07-01 ENCOUNTER — Telehealth: Payer: Self-pay | Admitting: Internal Medicine

## 2016-07-01 MED ORDER — BUTALBITAL-APAP-CAFFEINE 50-325-40 MG PO TABS: 1.0000 | ORAL_TABLET | Freq: Four times a day (QID) | ORAL | 1 refills | Status: DC | PRN

## 2016-07-01 NOTE — Telephone Encounter (Signed)
Pt is requesting a refill. If it is okay to print will you print rx so I can fax.   ----- Message -----  From: Riley Kill, CMA  Sent: 06/07/2016  2:00 PM  To: Ander Slade, RN  Subject: FW: Non-Urgent Medical Question               ----- Message -----  From: Dow Adolph  Sent: 06/07/2016  1:58 PM  To: [facility] Clinical Pool  Subject: Non-Urgent Medical Question             In Jan.2018 my mail order pharmacy changed from [facility] to [facility] [place]. [facility] for butalbital-acetaminophen-caffeine 50-325-40 MG tablet.    Please let me know if you have any questions.    [name],  [place] to ref [employer]

## 2016-07-02 NOTE — Telephone Encounter (Signed)
Lucy faxing to [facility]

## 2016-07-05 ENCOUNTER — Telehealth: Payer: Self-pay | Admitting: Internal Medicine

## 2016-07-05 DIAGNOSIS — R0602 Shortness of breath: Secondary | ICD-10-CM

## 2016-07-05 NOTE — Telephone Encounter (Signed)
Left message for patient that we will call her tomorrow again to schedule stress test and if possible return to see Dr. Harrington Challenger on 3/9

## 2016-07-05 NOTE — Telephone Encounter (Signed)
New message    Pt states that Michalene told her someone would call her to schedule stress test. States her appt is now on the 9th and no one has scheduled it. She is upset that she has been given the run around and has canceled her March 9th appt. Requests a call back because she is confused.

## 2016-07-06 NOTE — Telephone Encounter (Signed)
Spoke with patient and she has already scheduled her stress test and at this time made appointment for follow up with Dr. Harrington Challenger.

## 2016-07-09 ENCOUNTER — Ambulatory Visit: Payer: Self-pay | Admitting: Internal Medicine

## 2016-07-14 ENCOUNTER — Telehealth: Payer: BLUE CROSS/BLUE SHIELD | Admitting: Nurse Practitioner

## 2016-07-14 ENCOUNTER — Telehealth (HOSPITAL_COMMUNITY): Payer: Self-pay | Admitting: *Deleted

## 2016-07-14 DIAGNOSIS — R69 Illness, unspecified: Secondary | ICD-10-CM

## 2016-07-14 DIAGNOSIS — J111 Influenza due to unidentified influenza virus with other respiratory manifestations: Secondary | ICD-10-CM

## 2016-07-14 MED ORDER — OSELTAMIVIR PHOSPHATE 75 MG PO CAPS: 75.0000 mg | ORAL_CAPSULE | Freq: Two times a day (BID) | ORAL | 0 refills | Status: DC

## 2016-07-14 NOTE — Telephone Encounter (Signed)
Attempted to call patient regarding upcoming appointment, no answer x 2.  Julia Zimmerman

## 2016-07-14 NOTE — Progress Notes (Signed)

## 2016-07-15 ENCOUNTER — Telehealth (HOSPITAL_COMMUNITY): Payer: Self-pay | Admitting: Radiology

## 2016-07-15 NOTE — Telephone Encounter (Signed)
Patient given detailed instructions per Myocardial Perfusion Study Information Sheet for the test on 07/23/2016 at 9:30. Patient notified to arrive 15 minutes early and that it is imperative to arrive on time for appointment to keep from having the test rescheduled.  If you need to cancel or reschedule your appointment, please call the office within 24 hours of your appointment. Failure to do so may result in a cancellation of your appointment, and a $50 no show fee. Patient verbalized understanding.EHK

## 2016-07-16 ENCOUNTER — Encounter (HOSPITAL_COMMUNITY): Payer: Self-pay

## 2016-07-20 ENCOUNTER — Telehealth (HOSPITAL_COMMUNITY): Payer: Self-pay | Admitting: *Deleted

## 2016-07-20 NOTE — Telephone Encounter (Signed)
Patient given detailed instructions per Myocardial Perfusion Study Information Sheet for the test on 07/23/16 Patient notified to arrive 15 minutes early and that it is imperative to arrive on time for appointment to keep from having the test rescheduled.  If you need to cancel or reschedule your appointment, please call the office within 24 hours of your appointment. Failure to do so may result in a cancellation of your appointment, and a $50 no show fee. Patient verbalized understanding. Kirstie Peri

## 2016-07-23 ENCOUNTER — Ambulatory Visit (HOSPITAL_COMMUNITY): Payer: BLUE CROSS/BLUE SHIELD | Attending: Cardiology

## 2016-07-23 DIAGNOSIS — I499 Cardiac arrhythmia, unspecified: Secondary | ICD-10-CM | POA: Insufficient documentation

## 2016-07-23 DIAGNOSIS — I447 Left bundle-branch block, unspecified: Secondary | ICD-10-CM | POA: Diagnosis not present

## 2016-07-23 DIAGNOSIS — R0602 Shortness of breath: Secondary | ICD-10-CM | POA: Diagnosis not present

## 2016-07-23 LAB — MYOCARDIAL PERFUSION IMAGING
CHL CUP NUCLEAR SDS: 1
CHL CUP NUCLEAR SRS: 7
CHL CUP STRESS STAGE 1 DBP: 73 mmHg
CHL CUP STRESS STAGE 1 GRADE: 0 %
CHL CUP STRESS STAGE 1 HR: 77 {beats}/min
CHL CUP STRESS STAGE 2 DBP: 71 mmHg
CHL CUP STRESS STAGE 2 GRADE: 0 %
CHL CUP STRESS STAGE 2 SBP: 121 mmHg
CHL CUP STRESS STAGE 3 HR: 90 {beats}/min
CHL CUP STRESS STAGE 4 HR: 125 {beats}/min
CHL CUP STRESS STAGE 5 SPEED: 2.5 mph
CHL CUP STRESS STAGE 6 SPEED: 0 mph
CHL CUP STRESS STAGE 7 DBP: 60 mmHg
CHL CUP STRESS STAGE 7 GRADE: 0 %
CHL CUP STRESS STAGE 7 HR: 101 {beats}/min
CHL CUP STRESS STAGE 7 SBP: 108 mmHg
CSEPED: 4 min
CSEPPHR: 142 {beats}/min
CSEPPMHR: 91 %
Estimated workload: 6.7 METS
Exercise duration (sec): 45 s
LV dias vol: 67 mL (ref 46–106)
LVSYSVOL: 21 mL
MPHR: 157 {beats}/min
NUC STRESS TID: 0.95
Percent HR: 91 %
RATE: 0.21
Rest HR: 88 {beats}/min
SSS: 8
Stage 1 SBP: 118 mmHg
Stage 1 Speed: 0 mph
Stage 2 HR: 90 {beats}/min
Stage 2 Speed: 0 mph
Stage 3 Grade: 0 %
Stage 3 Speed: 0 mph
Stage 4 DBP: 81 mmHg
Stage 4 Grade: 10 %
Stage 4 SBP: 146 mmHg
Stage 4 Speed: 1.7 mph
Stage 5 Grade: 12 %
Stage 5 HR: 142 {beats}/min
Stage 6 Grade: 0 %
Stage 6 HR: 134 {beats}/min
Stage 7 Speed: 0 mph

## 2016-07-23 MED ORDER — REGADENOSON 0.4 MG/5ML IV SOLN: 0.4000 mg | Freq: Once | INTRAVENOUS | Status: DC

## 2016-07-23 MED ORDER — TECHNETIUM TC 99M TETROFOSMIN IV KIT
10.1000 | PACK | Freq: Once | INTRAVENOUS | Status: AC | PRN
  Administered 2016-07-23: 10.1 via INTRAVENOUS
  Filled 2016-07-23: qty 11

## 2016-07-23 MED ORDER — TECHNETIUM TC 99M TETROFOSMIN IV KIT
30.2000 | PACK | Freq: Once | INTRAVENOUS | Status: AC | PRN
Start: 2016-07-23 — End: 2016-07-23
  Administered 2016-07-23: 30.2 via INTRAVENOUS
  Filled 2016-07-23: qty 31

## 2016-07-29 ENCOUNTER — Telehealth: Payer: Self-pay | Admitting: Internal Medicine

## 2016-07-29 MED ORDER — METOPROLOL SUCCINATE ER 25 MG PO TB24: 25.0000 mg | ORAL_TABLET | Freq: Every day | ORAL | 3 refills | Status: DC

## 2016-07-29 NOTE — Telephone Encounter (Signed)
Notes recorded by Rodman Key, RN on 07/29/2016 at 9:23 AM EDT Left detailed message on self-identified voice mail. Advised to call back to discuss starting Toprol XL. Also sent Dr. Alan Ripper notes through her My Chart. ------  Notes recorded by Fay Records, MD on 07/20/2016 at 3:06 PM EDT No arrhythmias detected. Could try Toprol XL to see if slowing HR a little can help symptoms Toprol XL 25 mg per day    Sent prescription to [facility] per patient.

## 2016-07-29 NOTE — Telephone Encounter (Signed)
New message    Pt is returning call to [facility]. She states she does want the new [facility] [address].

## 2016-08-05 ENCOUNTER — Encounter: Payer: Self-pay | Admitting: Internal Medicine

## 2016-08-16 NOTE — Progress Notes (Signed)
Cardiology Office Note   Date:  08/23/2016   ID:  ZIYAH Zimmerman, DOB 02-20-1951, MRN [id number]  PCP:  Walker Kehr, MD  Cardiologist:   Dorris Carnes, MD   F/U of  SOB and palpitations    History of Present Illness: Julia Zimmerman is a 66 y.o. female with a history of SOB and palpitations  I saw her  Earlier this winter   When I saw the pt,   Holter scheduled and pt was set up for echo   Echo showed normla LVEF   Mild relaxaction abnormality  Myovue showed normal LVEF   Norma lperfusion  Pt walked only 4 min 45 sec    Since seen the pt did nto take metoprolol   Afraid it would make her dizzy  hasnt had many palpitations   She admits to not being very active  Walks some at work    Current Meds  Medication Sig  . alprazolam (XANAX) 2 MG tablet take 1 tablet by mouth three times a day if needed for anxiety  . butalbital-acetaminophen-caffeine (FIORICET, ESGIC) 50-325-40 MG tablet Take 1 tablet by mouth every 6 (six) hours as needed for headache.  . CRESTOR 5 MG tablet TAKE 1 TABLET DAILY  . Cyanocobalamin (VITAMIN B-12) 500 MCG SUBL 1 sl qd  . ergocalciferol (VITAMIN D2) 50000 UNITS capsule Take 1 capsule (50,000 Units total) by mouth once a week.  . fish oil-omega-3 fatty acids 1000 MG capsule Take 2 capsules (2 g total) by mouth daily.  . fluconazole (DIFLUCAN) 150 MG tablet Take 1 tablet (150 mg total) by mouth once.  . hydrochlorothiazide (MICROZIDE) 12.5 MG capsule Take 1 capsule (12.5 mg total) by mouth daily.  [name] losartan (COZAAR) 50 MG tablet Take 1 tablet (50 mg total) by mouth daily.  . metoprolol succinate (TOPROL-XL) 25 MG 24 hr tablet Take 1 tablet (25 mg total) by mouth daily.  [name] venlafaxine XR (EFFEXOR-XR) 150 MG 24 hr capsule Take 1 capsule (150 mg total) by mouth daily with breakfast. 1 cap bid for depression     Allergies:   Sertraline hcl; Hydrocodone; Sulfa antibiotics; Sulfamethoxazole; and Codeine   Past Medical History:  Diagnosis Date  . ADD  10/30/2008   Qualifier: Diagnosis of  By: Niel Hummer MD, Lorinda Creed Anxiety   . ANXIETY DEPRESSION 03/29/2007   Qualifier: Diagnosis of  By: Niel Hummer MD, Lorinda Creed   . Cough 04/21/2010   full eval including allergist. Working diagnosis - reflux.  . Depression   . Endometriosis   . Episodic tension type headache 11/07/2007   Qualifier: Diagnosis of  By: Niel Hummer MD, Lorinda Creed   . Essential hypertension, benign 05/15/2010   Qualifier: Diagnosis of  By: Elizebeth Koller MD, Mora Appl   . GERD 04/21/2010   Qualifier: Diagnosis of  By: Niel Hummer MD, Lorinda Creed   . GERD (gastroesophageal reflux disease)   . Hyperlipidemia   . HYPERLIPIDEMIA 05/15/2010   Qualifier: Diagnosis of  By: Elizebeth Koller MD, Mora Appl   . Learning disability    poor attention span and difficulty with retention  . ONYCHOMYCOSIS 10/30/2008   successfully treated with lamisil.  [name] RHINITIS 04/21/2010   Qualifier: Diagnosis of  By: Niel Hummer MD, Lorinda Creed     Past Surgical History:  Procedure Laterality Date  . G0P0    . LAPAROSCOPY  1994   x 2 for endometriosis  . LAPAROTOMY  1989   for benign tumor  .  NECK MASS EXCISION  1974   benign mass left     Social History:  The patient  reports that she quit smoking about 25 years ago. Her smoking use included Cigarettes. She has a 28.50 pack-year smoking history. She has never used smokeless tobacco. She reports that she does not drink alcohol or use drugs.   Family History:  The patient's family history includes Arthritis in her father; Diabetes in her mother; Heart disease in her sister; Hypertension in her mother.    ROS:  Please see the history of present illness. All other systems are reviewed and  Negative to the above problem except as noted.    PHYSICAL EXAM: VS:  BP (!) 108/58   Pulse 96   Ht 5\' 2"  (1.575 m)   Wt 177 lb 12.8 oz (80.6 kg)   SpO2 97%   BMI 32.52 kg/m   GEN: Well nourished, well developed, in no acute distress  HEENT: normal  Neck: no JVD,  carotid bruits, or masses Cardiac: RRR; no murmurs, rubs, or gallops,no edema  Respiratory: Upper airway "pseudowheeze"  Otherwise clear to auscultation bilaterally, normal work of breathing GI: soft, nontender, nondistended, + BS  No hepatomegaly  MS: no deformity Moving all extremities   Skin: warm and dry, no rash Neuro:  Strength and sensation are intact Psych: euthymic mood, full affect   EKG:  EKG is not  ordered today   Lipid Panel    Component Value Date/Time   CHOL 258 (H) 03/12/2016 1539   TRIG 323.0 (H) 03/12/2016 1539   TRIG 235 (HH) 04/12/2006 1440   HDL 48.40 03/12/2016 1539   CHOLHDL 5 03/12/2016 1539   VLDL 64.6 (H) 03/12/2016 1539   LDLCALC 71 11/22/2012 1616   LDLDIRECT 128.0 03/12/2016 1539      Wt Readings from Last 3 Encounters:  08/23/16 177 lb 12.8 oz (80.6 kg)  05/28/16 182 lb (82.6 kg)  04/23/16 177 lb (80.3 kg)      ASSESSMENT AND PLAN:  1  HTN  BP is good   2  Plapitations   Holter monitor neg for arrhythmia  She can take metoprolol if having symptoms  I also encouraged her to increase her activity  This should help pull resting heart rate down   3  SOB   Myovue is normal Poor exercise tolerance though  I encouraged her to start walking  Increase training   On exam she does have a pseudowheeze  Heas reflux  Not on agent   I have encouraged her to resume PPI daily    F/U as needed    Current medicines are reviewed at length with the patient today.  The patient does not have concerns regarding medicines.  Signed, Dorris Carnes, MD  08/23/2016 4:39 PM    [address], [place],   [address] Phone: [phone]; Fax: [phone]

## 2016-08-23 ENCOUNTER — Encounter: Payer: Self-pay | Admitting: Internal Medicine

## 2016-08-23 ENCOUNTER — Ambulatory Visit (INDEPENDENT_AMBULATORY_CARE_PROVIDER_SITE_OTHER): Payer: BLUE CROSS/BLUE SHIELD | Admitting: Internal Medicine

## 2016-08-23 VITALS — BP 108/58 | HR 96 | Ht 62.0 in | Wt 177.8 lb

## 2016-08-23 DIAGNOSIS — R0602 Shortness of breath: Secondary | ICD-10-CM

## 2016-08-23 DIAGNOSIS — R002 Palpitations: Secondary | ICD-10-CM | POA: Diagnosis not present

## 2016-08-23 MED ORDER — OMEPRAZOLE 20 MG PO CPDR: 20.0000 mg | DELAYED_RELEASE_CAPSULE | Freq: Every day | ORAL | 11 refills | Status: DC

## 2016-08-23 NOTE — Patient Instructions (Signed)
Take your Prilosec (omeprazole) 20 mg once every day Increase your physical activity Follow up as needed with Dr. Harrington Challenger

## 2016-09-15 ENCOUNTER — Other Ambulatory Visit: Payer: Self-pay | Admitting: Internal Medicine

## 2016-09-20 NOTE — Telephone Encounter (Signed)
[facility] vm../lmb 

## 2016-09-30 ENCOUNTER — Other Ambulatory Visit: Payer: Self-pay | Admitting: Internal Medicine

## 2016-10-01 NOTE — Telephone Encounter (Signed)
Last OV 12.22.17 Last refill 3.1.2018

## 2016-10-04 MED ORDER — BUTALBITAL-APAP-CAFFEINE 50-325-40 MG PO TABS: ORAL_TABLET | ORAL | 1 refills | Status: DC

## 2016-10-04 NOTE — Addendum Note (Signed)
Addended by: Douglass Rivers T on: 10/04/2016 10:47 AM   Modules accepted: Orders

## 2016-10-04 NOTE — Telephone Encounter (Signed)
Faxed rx

## 2016-11-19 ENCOUNTER — Emergency Department (HOSPITAL_COMMUNITY): Payer: BLUE CROSS/BLUE SHIELD

## 2016-11-19 ENCOUNTER — Encounter (HOSPITAL_COMMUNITY): Payer: Self-pay | Admitting: Emergency Medicine

## 2016-11-19 ENCOUNTER — Inpatient Hospital Stay (HOSPITAL_COMMUNITY)
Admission: EM | Admit: 2016-11-19 | Discharge: 2016-11-22 | DRG: 419 | Disposition: A | Discharge status: Home / Self Care | Payer: BLUE CROSS/BLUE SHIELD | Attending: Surgery | Admitting: Surgery

## 2016-11-19 DIAGNOSIS — Z888 Allergy status to other drugs, medicaments and biological substances status: Secondary | ICD-10-CM

## 2016-11-19 DIAGNOSIS — F819 Developmental disorder of scholastic skills, unspecified: Secondary | ICD-10-CM | POA: Diagnosis present

## 2016-11-19 DIAGNOSIS — Z87891 Personal history of nicotine dependence: Secondary | ICD-10-CM

## 2016-11-19 DIAGNOSIS — Z419 Encounter for procedure for purposes other than remedying health state, unspecified: Secondary | ICD-10-CM

## 2016-11-19 DIAGNOSIS — Z8249 Family history of ischemic heart disease and other diseases of the circulatory system: Secondary | ICD-10-CM

## 2016-11-19 DIAGNOSIS — Z882 Allergy status to sulfonamides status: Secondary | ICD-10-CM

## 2016-11-19 DIAGNOSIS — K8050 Calculus of bile duct without cholangitis or cholecystitis without obstruction: Secondary | ICD-10-CM

## 2016-11-19 DIAGNOSIS — Z8261 Family history of arthritis: Secondary | ICD-10-CM

## 2016-11-19 DIAGNOSIS — I10 Essential (primary) hypertension: Secondary | ICD-10-CM | POA: Diagnosis present

## 2016-11-19 DIAGNOSIS — K8020 Calculus of gallbladder without cholecystitis without obstruction: Secondary | ICD-10-CM | POA: Diagnosis present

## 2016-11-19 DIAGNOSIS — K8012 Calculus of gallbladder with acute and chronic cholecystitis without obstruction: Principal | ICD-10-CM | POA: Diagnosis present

## 2016-11-19 DIAGNOSIS — E785 Hyperlipidemia, unspecified: Secondary | ICD-10-CM | POA: Diagnosis present

## 2016-11-19 DIAGNOSIS — K219 Gastro-esophageal reflux disease without esophagitis: Secondary | ICD-10-CM | POA: Diagnosis present

## 2016-11-19 DIAGNOSIS — F329 Major depressive disorder, single episode, unspecified: Secondary | ICD-10-CM | POA: Diagnosis present

## 2016-11-19 DIAGNOSIS — Z833 Family history of diabetes mellitus: Secondary | ICD-10-CM

## 2016-11-19 DIAGNOSIS — Z885 Allergy status to narcotic agent status: Secondary | ICD-10-CM

## 2016-11-19 DIAGNOSIS — R1011 Right upper quadrant pain: Secondary | ICD-10-CM | POA: Diagnosis not present

## 2016-11-19 LAB — BASIC METABOLIC PANEL
ANION GAP: 12 (ref 5–15)
BUN: 18 mg/dL (ref 6–20)
CALCIUM: 9.1 mg/dL (ref 8.9–10.3)
CO2: 24 mmol/L (ref 22–32)
Chloride: 102 mmol/L (ref 101–111)
Creatinine, Ser: 0.63 mg/dL (ref 0.44–1.00)
GLUCOSE: 128 mg/dL — AB (ref 65–99)
Potassium: 3.6 mmol/L (ref 3.5–5.1)
SODIUM: 138 mmol/L (ref 135–145)

## 2016-11-19 LAB — HEPATIC FUNCTION PANEL
ALT: 25 U/L (ref 14–54)
AST: 23 U/L (ref 15–41)
Albumin: 4.3 g/dL (ref 3.5–5.0)
Alkaline Phosphatase: 103 U/L (ref 38–126)
Total Bilirubin: 0.5 mg/dL (ref 0.3–1.2)
Total Protein: 7.8 g/dL (ref 6.5–8.1)

## 2016-11-19 LAB — CBC
HCT: 43.6 % (ref 36.0–46.0)
HEMOGLOBIN: 15.3 g/dL — AB (ref 12.0–15.0)
MCH: 29.7 pg (ref 26.0–34.0)
MCHC: 35.1 g/dL (ref 30.0–36.0)
MCV: 84.7 fL (ref 78.0–100.0)
Platelets: 307 10*3/uL (ref 150–400)
RBC: 5.15 MIL/uL — ABNORMAL HIGH (ref 3.87–5.11)
RDW: 13.3 % (ref 11.5–15.5)
WBC: 12.3 10*3/uL — ABNORMAL HIGH (ref 4.0–10.5)

## 2016-11-19 LAB — SURGICAL PCR SCREEN
MRSA, PCR: NEGATIVE
Staphylococcus aureus: NEGATIVE

## 2016-11-19 LAB — POCT I-STAT TROPONIN I: TROPONIN I, POC: 0 ng/mL (ref 0.00–0.08)

## 2016-11-19 LAB — LIPASE, BLOOD: Lipase: 26 U/L (ref 11–51)

## 2016-11-19 MED ORDER — DIPHENHYDRAMINE HCL 12.5 MG/5ML PO ELIX: 12.5000 mg | ORAL_SOLUTION | Freq: Four times a day (QID) | ORAL | Status: DC | PRN

## 2016-11-19 MED ORDER — SODIUM CHLORIDE 0.9 % IV SOLN
1000.0000 mL | INTRAVENOUS | Status: DC
  Administered 2016-11-19 – 2016-11-22 (×7): 1000 mL via INTRAVENOUS

## 2016-11-19 MED ORDER — PANTOPRAZOLE SODIUM 40 MG IV SOLR
40.0000 mg | Freq: Every day | INTRAVENOUS | Status: DC
  Administered 2016-11-19 – 2016-11-20 (×2): 40 mg via INTRAVENOUS
  Filled 2016-11-19 (×2): qty 40

## 2016-11-19 MED ORDER — CHLORHEXIDINE GLUCONATE CLOTH 2 % EX PADS: 6.0000 | MEDICATED_PAD | Freq: Once | CUTANEOUS | Status: DC

## 2016-11-19 MED ORDER — DIPHENHYDRAMINE HCL 50 MG/ML IJ SOLN: 12.5000 mg | Freq: Four times a day (QID) | INTRAMUSCULAR | Status: DC | PRN

## 2016-11-19 MED ORDER — ACETAMINOPHEN 325 MG PO TABS
650.0000 mg | ORAL_TABLET | Freq: Four times a day (QID) | ORAL | Status: DC | PRN
Start: 2016-11-19 — End: 2016-11-22

## 2016-11-19 MED ORDER — ONDANSETRON 4 MG PO TBDP
4.0000 mg | ORAL_TABLET | Freq: Once | ORAL | Status: AC | PRN
  Administered 2016-11-19: 4 mg via ORAL
  Filled 2016-11-19: qty 1

## 2016-11-19 MED ORDER — MORPHINE SULFATE (PF) 4 MG/ML IV SOLN
4.0000 mg | Freq: Once | INTRAVENOUS | Status: AC
  Administered 2016-11-19: 4 mg via INTRAVENOUS
  Filled 2016-11-19: qty 1

## 2016-11-19 MED ORDER — ONDANSETRON 4 MG PO TBDP: 4.0000 mg | ORAL_TABLET | Freq: Four times a day (QID) | ORAL | Status: DC | PRN

## 2016-11-19 MED ORDER — ALPRAZOLAM 0.5 MG PO TABS
0.5000 mg | ORAL_TABLET | Freq: Every day | ORAL | Status: DC
  Administered 2016-11-19 – 2016-11-21 (×3): 0.5 mg via ORAL
  Filled 2016-11-19 (×3): qty 1

## 2016-11-19 MED ORDER — SIMETHICONE 80 MG PO CHEW: 40.0000 mg | CHEWABLE_TABLET | Freq: Four times a day (QID) | ORAL | Status: DC | PRN

## 2016-11-19 MED ORDER — MORPHINE SULFATE (PF) 2 MG/ML IV SOLN
1.0000 mg | INTRAVENOUS | Status: DC | PRN
  Administered 2016-11-19 – 2016-11-21 (×3): 2 mg via INTRAVENOUS
  Filled 2016-11-19 (×3): qty 1

## 2016-11-19 MED ORDER — PANTOPRAZOLE SODIUM 40 MG IV SOLR
40.0000 mg | Freq: Once | INTRAVENOUS | Status: AC
  Administered 2016-11-19: 40 mg via INTRAVENOUS
  Filled 2016-11-19: qty 40

## 2016-11-19 MED ORDER — SODIUM CHLORIDE 0.9 % IV SOLN
1000.0000 mL | INTRAVENOUS | Status: DC
  Administered 2016-11-19: 1000 mL via INTRAVENOUS

## 2016-11-19 MED ORDER — ONDANSETRON HCL 4 MG/2ML IJ SOLN
4.0000 mg | Freq: Four times a day (QID) | INTRAMUSCULAR | Status: DC | PRN
  Administered 2016-11-19: 4 mg via INTRAVENOUS
  Filled 2016-11-19: qty 2

## 2016-11-19 MED ORDER — ALPRAZOLAM 0.5 MG PO TABS: 0.5000 mg | ORAL_TABLET | Freq: Every day | ORAL | Status: DC

## 2016-11-19 MED ORDER — SODIUM CHLORIDE 0.9 % IV BOLUS (SEPSIS)
1000.0000 mL | Freq: Once | INTRAVENOUS | Status: AC
  Administered 2016-11-19: 1000 mL via INTRAVENOUS

## 2016-11-19 MED ORDER — VENLAFAXINE HCL ER 150 MG PO CP24
150.0000 mg | ORAL_CAPSULE | ORAL | Status: DC
  Administered 2016-11-22: 150 mg via ORAL
  Filled 2016-11-19: qty 1

## 2016-11-19 MED ORDER — ONDANSETRON HCL 4 MG/2ML IJ SOLN
4.0000 mg | Freq: Once | INTRAMUSCULAR | Status: AC
  Administered 2016-11-19: 4 mg via INTRAVENOUS
  Filled 2016-11-19: qty 2

## 2016-11-19 MED ORDER — ACETAMINOPHEN 650 MG RE SUPP: 650.0000 mg | Freq: Four times a day (QID) | RECTAL | Status: DC | PRN

## 2016-11-19 MED ORDER — METHOCARBAMOL 1000 MG/10ML IJ SOLN
500.0000 mg | Freq: Three times a day (TID) | INTRAVENOUS | Status: DC | PRN
  Filled 2016-11-19: qty 5

## 2016-11-19 MED ORDER — ENOXAPARIN SODIUM 40 MG/0.4ML ~~LOC~~ SOLN
40.0000 mg | SUBCUTANEOUS | Status: DC
  Administered 2016-11-20 – 2016-11-21 (×2): 40 mg via SUBCUTANEOUS
  Filled 2016-11-19 (×3): qty 0.4

## 2016-11-19 MED ORDER — HYDRALAZINE HCL 20 MG/ML IJ SOLN
10.0000 mg | INTRAMUSCULAR | Status: DC | PRN
Start: 2016-11-19 — End: 2016-11-22
  Filled 2016-11-19: qty 0.5

## 2016-11-19 MED ORDER — DEXTROSE 5 % IV SOLN
2.0000 g | INTRAVENOUS | Status: DC
  Administered 2016-11-19 – 2016-11-21 (×3): 2 g via INTRAVENOUS
  Filled 2016-11-19 (×4): qty 2

## 2016-11-19 MED ORDER — LOSARTAN POTASSIUM 50 MG PO TABS
50.0000 mg | ORAL_TABLET | Freq: Every day | ORAL | Status: DC
  Administered 2016-11-21 – 2016-11-22 (×2): 50 mg via ORAL
  Filled 2016-11-19 (×2): qty 1

## 2016-11-19 NOTE — H&P (Signed)
[facility] Surgery Admission Note  Julia Zimmerman 02-08-1951  [id number].    Requesting MD: Johnney Killian Chief Complaint/Reason for Consult: Gallstones  HPI:  Julia Zimmerman is a 66yo female who presented to [facility] earlier today with acute onset epigastric abdominal pain. States that the pain woke her up around 1:30AM. It was sharp, severe, and gradually getting worse. States that the pain radiated under her right ribcage. Associated with nausea, vomiting, and diarrhea. States that she had never had pain like this before. She tried taking Pepto bismal and Pepcid but this did not help. Last meal last night.  Hospital workup: - u/s shows gallstone measuring 2.8 cm which might be lodged in the neck of the gallbladder, no definite cholecystitis; CBD measures 49m - WBC 12.3, afebrile - lipase and LFTs WNL  - PMH significant for HTN, GERD, depression - Abdominal surgical history: prior laparotomy and laparoscopy for endometriosis in 1989 and 1994 - ECHO 06/11/16: EF 55-60% - Anticoagulants: none - Former smoker - Employment: dNetwork engineerjob at [employer]  Constitutional: Negative.   HENT: Negative.   Eyes: Negative.   Respiratory: Negative.   Cardiovascular: Negative.   Gastrointestinal: Positive for abdominal pain, diarrhea, nausea and vomiting. Negative for blood in stool, constipation, heartburn and melena.  Genitourinary: Negative.   Musculoskeletal: Negative.   Skin: Negative.   Neurological: Negative.    All systems reviewed and otherwise negative except for as above  Family History  Problem Relation Age of Onset  . Hypertension Mother   . Diabetes Mother   . Arthritis Father        gout  . Heart disease Sister        palpitations  . Cancer Neg Hx   . COPD Neg Hx   . Colon cancer Neg Hx     Past Medical History:  Diagnosis Date  . ADD 10/30/2008   Qualifier: Diagnosis of  By: SNiel HummerMD, WLorinda CreedAnxiety   . ANXIETY  DEPRESSION 03/29/2007   Qualifier: Diagnosis of  By: SNiel HummerMD, WLorinda Creed  . Cough 04/21/2010   full eval including allergist. Working diagnosis - reflux.  . Depression   . Endometriosis   . Episodic tension type headache 11/07/2007   Qualifier: Diagnosis of  By: SNiel HummerMD, WLorinda Creed  . Essential hypertension, benign 05/15/2010   Qualifier: Diagnosis of  By: HElizebeth KollerMD, TMora Appl  . GERD 04/21/2010   Qualifier: Diagnosis of  By: SNiel HummerMD, WLorinda Creed  . GERD (gastroesophageal reflux disease)   . Hyperlipidemia   . HYPERLIPIDEMIA 05/15/2010   Qualifier: Diagnosis of  By: HElizebeth KollerMD, TMora Appl  . Learning disability    poor attention span and difficulty with retention  . ONYCHOMYCOSIS 10/30/2008   successfully treated with lamisil.  .Marland KitchenRHINITIS 04/21/2010   Qualifier: Diagnosis of  By: SNiel HummerMD, WLorinda Creed    Past Surgical History:  Procedure Laterality Date  . G0P0    . LAPAROSCOPY  1994   x 2 for endometriosis  . LAPAROTOMY  1989   for benign tumor  . NECK MASS EXCISION  1974   benign mass left    Social History:  reports that she quit smoking about 26 years ago. Her smoking use included Cigarettes. She has a 28.50 pack-year smoking history. She has never used smokeless tobacco. She reports that she does not drink alcohol or use drugs.  Allergies:  Allergies  Allergen Reactions  . Sertraline Hcl Hives and Itching  . Hydrocodone Nausea Only  . Sulfa Antibiotics Hives  . Sulfamethoxazole Hives  . Codeine Nausea Only     (Not in a hospital admission)  Prior to Admission medications   Medication Sig Start Date End Date Taking? Authorizing Provider  alprazolam (XANAX) 2 MG tablet TAKE 1 TABLET 3 TIMES A DAY AS NEEDED FOR ANXIETY 09/19/16  Yes Plotnikov, Evie Lacks, MD  butalbital-acetaminophen-caffeine (FIORICET, ESGIC) 50-325-40 MG tablet TAKE 1 TABLET EVERY 6 HOURSAS NEEDED FOR HEADACHE Patient taking differently: 1 tablet every 6 (six) hours as needed for  migraine.  10/04/16  Yes Plotnikov, Evie Lacks, MD  cholecalciferol (VITAMIN D) 1000 units tablet Take 1,000 Units by mouth daily.   Yes [provider]  Cyanocobalamin (VITAMIN B-12) 500 MCG SUBL 1 sl qd Patient taking differently: Place 500 mcg under the tongue daily.  01/17/14  Yes Plotnikov, Evie Lacks, MD  hydrochlorothiazide (MICROZIDE) 12.5 MG capsule Take 1 capsule (12.5 mg total) by mouth daily. 03/12/16  Yes Plotnikov, Evie Lacks, MD  losartan (COZAAR) 50 MG tablet Take 50 mg by mouth daily.   Yes [provider]  omega-3 acid ethyl esters (LOVAZA) 1 g capsule Take 1 g by mouth daily.   Yes [provider]  omeprazole (PRILOSEC) 20 MG capsule Take 1 capsule (20 mg total) by mouth daily. 08/23/16  Yes Fay Records, MD  venlafaxine XR (EFFEXOR-XR) 150 MG 24 hr capsule Take 1 capsule (150 mg total) by mouth daily with breakfast. 1 cap bid for depression Patient taking differently: Take 150 mg by mouth every other day.  03/12/16  Yes Plotnikov, Evie Lacks, MD    Blood pressure (!) 174/85, pulse 73, temperature 98.1 F (36.7 C), temperature source Oral, resp. rate 16, SpO2 94 %. Physical Exam: General: pleasant, WD/WN white female who is laying in bed in NAD HEENT: head is normocephalic, atraumatic.  Sclera are noninjected.  Pupils equal and round.  Ears and nose without any masses or lesions.  Mouth is pink and moist. Dentition fair Heart: regular, rate, and rhythm.  No obvious murmurs, gallops, or rubs noted.  Palpable pedal pulses bilaterally Lungs: CTAB, no wheezes, rhonchi, or rales noted.  Respiratory effort nonlabored Abd: well healed previous lap incisions and lower transverse incision, soft, ND, +BS, no masses, hernias, or organomegaly. +TTP RUQ and epigastric region with +Murphy sign MS: all 4 extremities are symmetrical with no cyanosis, clubbing, or edema. Skin: warm and dry with no masses, lesions, or rashes Psych: A&Ox3 with an appropriate affect. Neuro:  cranial nerves grossly intact, extremity CSM intact bilaterally, normal speech  Results for orders placed or performed during the hospital encounter of 11/19/16 (from the past 48 hour(s))  Hepatic function panel     Status: Abnormal   Collection Time: 11/19/16 10:10 AM  Result Value Ref Range   Total Protein 7.8 6.5 - 8.1 g/dL   Albumin 4.3 3.5 - 5.0 g/dL   AST 23 15 - 41 U/L   ALT 25 14 - 54 U/L   Alkaline Phosphatase 103 38 - 126 U/L   Total Bilirubin 0.5 0.3 - 1.2 mg/dL   Bilirubin, Direct <0.1 (L) 0.1 - 0.5 mg/dL   Indirect Bilirubin NOT CALCULATED 0.3 - 0.9 mg/dL  Lipase, blood     Status: None   Collection Time: 11/19/16 10:10 AM  Result Value Ref Range   Lipase 26 11 - 51 U/L  Basic metabolic  panel     Status: Abnormal   Collection Time: 11/19/16 10:11 AM  Result Value Ref Range   Sodium 138 135 - 145 mmol/L   Potassium 3.6 3.5 - 5.1 mmol/L   Chloride 102 101 - 111 mmol/L   CO2 24 22 - 32 mmol/L   Glucose, Bld 128 (H) 65 - 99 mg/dL   BUN 18 6 - 20 mg/dL   Creatinine, Ser 0.63 0.44 - 1.00 mg/dL   Calcium 9.1 8.9 - 10.3 mg/dL   GFR calc non Af Amer >60 >60 mL/min   GFR calc Af Amer >60 >60 mL/min    Comment: (NOTE) The eGFR has been calculated using the CKD EPI equation. This calculation has not been validated in all clinical situations. eGFR's persistently <60 mL/min signify possible Chronic Kidney Disease.    Anion gap 12 5 - 15  CBC     Status: Abnormal   Collection Time: 11/19/16 10:11 AM  Result Value Ref Range   WBC 12.3 (H) 4.0 - 10.5 K/uL   RBC 5.15 (H) 3.87 - 5.11 MIL/uL   Hemoglobin 15.3 (H) 12.0 - 15.0 g/dL   HCT 43.6 36.0 - 46.0 %   MCV 84.7 78.0 - 100.0 fL   MCH 29.7 26.0 - 34.0 pg   MCHC 35.1 30.0 - 36.0 g/dL   RDW 13.3 11.5 - 15.5 %   Platelets 307 150 - 400 K/uL  POCT i-Stat troponin I     Status: None   Collection Time: 11/19/16 10:23 AM  Result Value Ref Range   Troponin i, poc 0.00 0.00 - 0.08 ng/mL   Comment 3            Comment: Due to  the release kinetics of cTnI, a negative result within the first hours of the onset of symptoms does not rule out myocardial infarction with certainty. If myocardial infarction is still suspected, repeat the test at appropriate intervals.    Dg Chest 2 View  Result Date: 11/19/2016 CLINICAL DATA:  Mid chest pain since last evening. EXAM: CHEST  2 VIEW COMPARISON:  06/19/2015; 07/28/2006; 03/18/2009 FINDINGS: Grossly unchanged cardiac silhouette and mediastinal contours. Bilateral infrahilar opacities are unchanged and favored to represent atelectasis or scar. No new focal airspace opacities. No pleural effusion or pneumothorax. No evidence of edema. No acute osseous abnormalities. IMPRESSION: No acute cardiopulmonary disease. Electronically Signed   By: Sandi Mariscal M.D.   On: 11/19/2016 10:16   US Abdomen Limited  Result Date: 11/19/2016 CLINICAL DATA:  66 year old female with right upper quadrant abdominal pain since 0100 hours. EXAM: ULTRASOUND ABDOMEN LIMITED RIGHT UPPER QUADRANT COMPARISON:  Chest CT 03/18/2009. FINDINGS: Gallbladder: Shadowing 28 mm gallstone within the neck of the gallbladder (image 8). The stone seems to be nonmobile on these images. Gallbladder wall thickness remains normal, and no pericholecystic fluid is identified. The patient had received pain medication prior to this exam, and no sonographic Murphy sign was elicited. Common Bile Duct: 7 mm, upper limits of normal to mildly dilated, although a 6-7 mm CBD is demonstrated on the 2010 chest CT. Liver: No intrahepatic biliary ductal dilatation. No discrete liver lesion. Hepatic echogenicity within normal limits. Other findings: Negative visible right kidney. IMPRESSION: 1. Gallstone measuring 2.8 cm which might be lodged in the neck of the gallbladder. Equivocal sonographic Percell Miller sign due to prior pain medication, but no other ultrasound features of acute cholecystitis. 2. CBD at the upper limits of normal. No intrahepatic  biliary ductal dilatation to strongly suggest  acute biliary obstruction. Electronically Signed   By: Genevie Ann M.D.   On: 11/19/2016 12:35      Assessment/Plan Symptomatic cholelithiasis - abdominal surgical history: prior laparotomy and laparoscopy for endometriosis in 1989 and 1994 - acute onset epigastric/RUQ abdominal pain, nausea, vomiting - u/s shows gallstone measuring 2.8 cm which might be lodged in the neck of the gallbladder, no definite cholecystitis  - WBC 12.3, afebrile - lipase and LFTs WNL  HTN - home meds; IV hydralazine PRN GERD - protonix Depression - home meds  ID - rocephin VTE - SCDs, lovenox FEN - IVF, clear liquids today and NPO after midnight  Plan - Admit to med-surg. Will start on IVF, pain control, antiemetics, antibiotics (rocephin). Plan for lap chole tomorrow.  Jerrye Beavers, [facility] Surgery 11/19/2016, 1:38 PM Pager: [phone] Consults: [phone] Mon-Fri 7:00 am-4:30 pm Sat-Sun 7:00 am-11:30 am

## 2016-11-19 NOTE — Anesthesia Preprocedure Evaluation (Addendum)
Anesthesia Evaluation  Patient identified by MRN, date of birth, ID band Patient awake    Reviewed: Allergy & Precautions, NPO status , Patient's Chart, lab work & pertinent test results  Airway Mallampati: II  TM Distance: >3 FB Neck ROM: Full    Dental  (+) Teeth Intact, Dental Advisory Given   Pulmonary neg pulmonary ROS, former smoker,    breath sounds clear to auscultation       Cardiovascular hypertension, Pt. on medications  Rhythm:Regular Rate:Normal     Neuro/Psych  Headaches, PSYCHIATRIC DISORDERS Anxiety Depression    GI/Hepatic Neg liver ROS, GERD  Medicated,  Endo/Other  negative endocrine ROS  Renal/GU negative Renal ROS     Musculoskeletal negative musculoskeletal ROS (+)   Abdominal   Peds  Hematology negative hematology ROS (+)   Anesthesia Other Findings Day of surgery medications reviewed with the patient.  - HLD  Reproductive/Obstetrics                            Lab Results  Component Value Date   WBC 11.3 (H) 11/20/2016   HGB 14.3 11/20/2016   HCT 42.4 11/20/2016   MCV 87.4 11/20/2016   PLT 278 11/20/2016   Lab Results  Component Value Date   CREATININE 0.63 11/20/2016   BUN 6 11/20/2016   NA 139 11/20/2016   K 3.5 11/20/2016   CL 101 11/20/2016   CO2 28 11/20/2016   No results found for: INR, PROTIME  EKG: normal sinus rhythm.  Echo: - Left ventricle: The cavity size was normal. Systolic function was   normal. The estimated ejection fraction was in the range of 55%   to 60%. Wall motion was normal; there were no regional wall   motion abnormalities. There was an increased relative   contribution of atrial contraction to ventricular filling.   Doppler parameters are consistent with abnormal left ventricular   relaxation (grade 1 diastolic dysfunction). - Aortic valve: Trileaflet; normal thickness, mildly calcified   leaflets. - Mitral valve: Mild  diffuse calcification of the anterior leaflet.   There was mild regurgitation. - Left atrium: The atrium was mildly dilated. - Atrial septum: There was increased thickness of the septum,   consistent with lipomatous hypertrophy. - Pulmonary arteries: Systolic pressure could not be accurately   estimated.   Anesthesia Physical Anesthesia Plan  ASA: II  Anesthesia Plan: General   Post-op Pain Management:    Induction: Intravenous  PONV Risk Score and Plan: 4 or greater and Ondansetron, Dexamethasone, Propofol, Midazolam and Scopolamine patch - Pre-op  Airway Management Planned: Oral ETT  Additional Equipment:   Intra-op Plan:   Post-operative Plan: Extubation in OR  Informed Consent: I have reviewed the patients History and Physical, chart, labs and discussed the procedure including the risks, benefits and alternatives for the proposed anesthesia with the patient or authorized representative who has indicated his/her understanding and acceptance.   Dental advisory given  Plan Discussed with: CRNA  Anesthesia Plan Comments:         Anesthesia Quick Evaluation

## 2016-11-19 NOTE — ED Triage Notes (Signed)
Pt states that last night she started having mid-epigastric pain and chest pain with N/V that started this morning. States pain is tight and constant. Normal stress test in January. Alert and oriented.

## 2016-11-19 NOTE — ED Provider Notes (Signed)
[facility] DEPT Provider Note   CSN: [id number] Arrival date & time: 11/19/16  7858     History   Chief Complaint Chief Complaint  Patient presents with  . Chest Pain    HPI Julia Zimmerman is a 66 y.o. female.  HPI Patient ports she went to bed feeling well. At 1:30 AM she was awakened with chest tightness and epigastric pain. She reports things evolved such that she got a lot of burning pain that was severe and persistent in the center of her epigastrium and under her rib cage on both sides. She vomited multiple times. She reports the tightness quality of her chest has resolved but the burning in her epigastrium persists and is severe. She reports she cannot get in a comfortable position. She tried Pepto-Bismol and Pepcid but believes she vomited them back up. She reports last thing she ate in the evening was olives and cheese. Patient reports she did have a cardiac workup last year as a health maintenance evaluation. She reports she had an echo, wore a monitor and had a stress test which were all normal. Past Medical History:  Diagnosis Date  . ADD 10/30/2008   Qualifier: Diagnosis of  By: Niel Hummer MD, Lorinda Creed Anxiety   . ANXIETY DEPRESSION 03/29/2007   Qualifier: Diagnosis of  By: Niel Hummer MD, Lorinda Creed   . Cough 04/21/2010   full eval including allergist. Working diagnosis - reflux.  . Depression   . Endometriosis   . Episodic tension type headache 11/07/2007   Qualifier: Diagnosis of  By: Niel Hummer MD, Lorinda Creed   . Essential hypertension, benign 05/15/2010   Qualifier: Diagnosis of  By: Elizebeth Koller MD, Mora Appl   . GERD 04/21/2010   Qualifier: Diagnosis of  By: Niel Hummer MD, Lorinda Creed   . GERD (gastroesophageal reflux disease)   . Hyperlipidemia   . HYPERLIPIDEMIA 05/15/2010   Qualifier: Diagnosis of  By: Elizebeth Koller MD, Mora Appl   . Learning disability    poor attention span and difficulty with retention  . ONYCHOMYCOSIS 10/30/2008   successfully treated with  lamisil.  [name] RHINITIS 04/21/2010   Qualifier: Diagnosis of  By: Niel Hummer MD, Lorinda Creed     Patient Active Problem List   Diagnosis Date Noted  . Insomnia 04/23/2016  . Arthralgia 03/12/2016  . Pain in joint, shoulder region 03/12/2016  . Left upper arm pain 07/15/2015  . Chest pain 07/15/2015  . 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 Acute sinusitis 01/15/2015  . Well adult exam 01/02/2014  . Headache 01/02/2014  . Obesity 09/17/2013  . HYPERLIPIDEMIA 05/15/2010  . Essential hypertension, benign 05/15/2010  . RHINITIS 04/21/2010  . GERD 04/21/2010  . ADD 10/30/2008  . Palpitations 11/07/2007  . ANXIETY DEPRESSION 03/29/2007    Past Surgical History:  Procedure Laterality Date  . G0P0    . LAPAROSCOPY  1994   x 2 for endometriosis  . LAPAROTOMY  1989   for benign tumor  . NECK MASS EXCISION  1974   benign mass left    OB History    No data available       Home Medications    Prior to Admission medications   Medication Sig Start Date End Date Taking? Authorizing Provider  alprazolam (XANAX) 2 MG tablet TAKE 1 TABLET 3 TIMES A DAY AS NEEDED FOR ANXIETY 09/19/16  Yes Plotnikov, Evie Lacks, MD  butalbital-acetaminophen-caffeine (FIORICET, ESGIC) 50-325-40 MG tablet TAKE 1 TABLET EVERY 6 HOURSAS NEEDED  FOR HEADACHE Patient taking differently: 1 tablet every 6 (six) hours as needed for migraine.  10/04/16  Yes Plotnikov, Evie Lacks, MD  cholecalciferol (VITAMIN D) 1000 units tablet Take 1,000 Units by mouth daily.   Yes [provider]  Cyanocobalamin (VITAMIN B-12) 500 MCG SUBL 1 sl qd Patient taking differently: Place 500 mcg under the tongue daily.  01/17/14  Yes Plotnikov, Evie Lacks, MD  hydrochlorothiazide (MICROZIDE) 12.5 MG capsule Take 1 capsule (12.5 mg total) by mouth daily. 03/12/16  Yes Plotnikov, Evie Lacks, MD  losartan (COZAAR) 50 MG tablet Take 50 mg by mouth daily.   Yes [provider]  omega-3 acid ethyl esters (LOVAZA) 1 g capsule Take 1 g by  mouth daily.   Yes [provider]  omeprazole (PRILOSEC) 20 MG capsule Take 1 capsule (20 mg total) by mouth daily. 08/23/16  Yes Fay Records, MD  venlafaxine XR (EFFEXOR-XR) 150 MG 24 hr capsule Take 1 capsule (150 mg total) by mouth daily with breakfast. 1 cap bid for depression Patient taking differently: Take 150 mg by mouth every other day.  03/12/16  Yes Plotnikov, Evie Lacks, MD    Family History Family History  Problem Relation Age of Onset  . Hypertension Mother   . Diabetes Mother   . Arthritis Father        gout  . Heart disease Sister        palpitations  . Cancer Neg Hx   . COPD Neg Hx   . Colon cancer Neg Hx     Social History Social History  Substance Use Topics  . Smoking status: Former Smoker    Packs/day: 1.50    Years: 19.00    Types: Cigarettes    Quit date: 10/27/1990  . Smokeless tobacco: Never Used  . Alcohol use No     Allergies   Sertraline hcl; Hydrocodone; Sulfa antibiotics; Sulfamethoxazole; and Codeine   Review of Systems Review of Systems 10 Systems reviewed and are negative for acute change except as noted in the HPI.   Physical Exam Updated Vital Signs BP (!) 174/85 (BP Location: Right Arm)   Pulse 73   Temp 98.1 F (36.7 C) (Oral)   Resp 16   SpO2 94%   Physical Exam  Constitutional: She is oriented to person, place, and time.  Patient is alert and nontoxic. No respiratory distress. She is up and standing and walking about in the room. She appears to be in pain.  HENT:  Head: Normocephalic and atraumatic.  Eyes: EOM are normal.  Cardiovascular: Normal rate, regular rhythm, normal heart sounds and intact distal pulses.   Pulmonary/Chest: Effort normal and breath sounds normal.  Abdominal: Soft.  Patient has severe pain to palpation in the right upper quadrant. Moderate pain in the epigastrium. Lower abdomen nontender and soft.  Musculoskeletal: Normal range of motion. She exhibits no edema or tenderness.    Neurological: She is alert and oriented to person, place, and time. No cranial nerve deficit. She exhibits normal muscle tone. Coordination normal.  Skin: Skin is warm and dry.  Psychiatric: She has a normal mood and affect.     ED Treatments / Results  Labs (all labs ordered are listed, but only abnormal results are displayed) Labs Reviewed  BASIC METABOLIC PANEL - Abnormal; Notable for the following:       Result Value   Glucose, Bld 128 (*)    All other components within normal limits  CBC - Abnormal; Notable for  the following:    WBC 12.3 (*)    RBC 5.15 (*)    Hemoglobin 15.3 (*)    All other components within normal limits  HEPATIC FUNCTION PANEL - Abnormal; Notable for the following:    Bilirubin, Direct <0.1 (*)    All other components within normal limits  LIPASE, BLOOD  I-STAT TROPONIN, ED  POCT I-STAT TROPONIN I    EKG  EKG Interpretation  Date/Time:  Friday November 19 2016 09:54:28 EDT Ventricular Rate:  80 PR Interval:    QRS Duration: 90 QT Interval:  397 QTC Calculation: 458 R Axis:   75 Text Interpretation:  Sinus rhythm no sig change from oldet ECG. Resolved IVCD compared to most recent previous Confirmed by Charlesetta Shanks [id number]) on 11/19/2016 11:06:42 AM       Radiology Dg Chest 2 View  Result Date: 11/19/2016 CLINICAL DATA:  Mid chest pain since last evening. EXAM: CHEST  2 VIEW COMPARISON:  06/19/2015; 07/28/2006; 03/18/2009 FINDINGS: Grossly unchanged cardiac silhouette and mediastinal contours. Bilateral infrahilar opacities are unchanged and favored to represent atelectasis or scar. No new focal airspace opacities. No pleural effusion or pneumothorax. No evidence of edema. No acute osseous abnormalities. IMPRESSION: No acute cardiopulmonary disease. Electronically Signed   By: Sandi Mariscal M.D.   On: 11/19/2016 10:16   US Abdomen Limited  Result Date: 11/19/2016 CLINICAL DATA:  66 year old female with right upper quadrant abdominal pain since  0100 hours. EXAM: ULTRASOUND ABDOMEN LIMITED RIGHT UPPER QUADRANT COMPARISON:  Chest CT 03/18/2009. FINDINGS: Gallbladder: Shadowing 28 mm gallstone within the neck of the gallbladder (image 8). The stone seems to be nonmobile on these images. Gallbladder wall thickness remains normal, and no pericholecystic fluid is identified. The patient had received pain medication prior to this exam, and no sonographic Murphy sign was elicited. Common Bile Duct: 7 mm, upper limits of normal to mildly dilated, although a 6-7 mm CBD is demonstrated on the 2010 chest CT. Liver: No intrahepatic biliary ductal dilatation. No discrete liver lesion. Hepatic echogenicity within normal limits. Other findings: Negative visible right kidney. IMPRESSION: 1. Gallstone measuring 2.8 cm which might be lodged in the neck of the gallbladder. Equivocal sonographic Percell Miller sign due to prior pain medication, but no other ultrasound features of acute cholecystitis. 2. CBD at the upper limits of normal. No intrahepatic biliary ductal dilatation to strongly suggest acute biliary obstruction. Electronically Signed   By: Genevie Ann M.D.   On: 11/19/2016 12:35    Procedures Procedures (including critical care time)  Medications Ordered in ED Medications  sodium chloride 0.9 % bolus 1,000 mL (0 mLs Intravenous Stopped 11/19/16 1339)    Followed by  0.9 %  sodium chloride infusion (1,000 mLs Intravenous New Bag/Given 11/19/16 1406)  ondansetron (ZOFRAN-ODT) disintegrating tablet 4 mg (4 mg Oral Given 11/19/16 1013)  pantoprazole (PROTONIX) injection 40 mg (40 mg Intravenous Given 11/19/16 1201)  ondansetron (ZOFRAN) injection 4 mg (4 mg Intravenous Given 11/19/16 1201)  morphine 4 MG/ML injection 4 mg (4 mg Intravenous Given 11/19/16 1201)  morphine 4 MG/ML injection 4 mg (4 mg Intravenous Given 11/19/16 1406)     Initial Impression / Assessment and Plan / ED Course  I have reviewed the triage vital signs and the nursing notes.  Pertinent labs  & imaging results that were available during my care of the patient were reviewed by me and considered in my medical decision making (see chart for details).    Consult: General surgery for admission Final Clinical  Impressions(s) / ED Diagnoses   Final diagnoses:  Biliary colic  Patient presents with right upper quadrant and epigastric pain. Presentation classic for biliary colic. Ultrasound confirms gallbladder stone and possible common duct dilation as well. Ultrasound is not showing wall thickening however patient does have leukocytosis. I suspect early cholecystitis. Patient will be seen and admitted to surgery. New Prescriptions New Prescriptions   No medications on file     Charlesetta Shanks, MD 11/19/16 1432

## 2016-11-20 ENCOUNTER — Observation Stay (HOSPITAL_COMMUNITY): Payer: BLUE CROSS/BLUE SHIELD | Admitting: Certified Registered Nurse Anesthetist

## 2016-11-20 ENCOUNTER — Encounter (HOSPITAL_COMMUNITY): Admission: EM | Disposition: A | Discharge status: Home / Self Care | Payer: Self-pay | Source: Home / Self Care

## 2016-11-20 ENCOUNTER — Encounter (HOSPITAL_COMMUNITY): Payer: Self-pay | Admitting: Certified Registered Nurse Anesthetist

## 2016-11-20 ENCOUNTER — Observation Stay (HOSPITAL_COMMUNITY): Payer: BLUE CROSS/BLUE SHIELD

## 2016-11-20 HISTORY — PX: CHOLECYSTECTOMY: SHX55

## 2016-11-20 LAB — CBC
HCT: 42.4 % (ref 36.0–46.0)
Hemoglobin: 14.3 g/dL (ref 12.0–15.0)
MCH: 29.5 pg (ref 26.0–34.0)
MCHC: 33.7 g/dL (ref 30.0–36.0)
MCV: 87.4 fL (ref 78.0–100.0)
Platelets: 278 10*3/uL (ref 150–400)
RBC: 4.85 MIL/uL (ref 3.87–5.11)
RDW: 13.8 % (ref 11.5–15.5)
WBC: 11.3 10*3/uL — ABNORMAL HIGH (ref 4.0–10.5)

## 2016-11-20 LAB — COMPREHENSIVE METABOLIC PANEL
ALBUMIN: 3.8 g/dL (ref 3.5–5.0)
ALK PHOS: 93 U/L (ref 38–126)
ALT: 22 U/L (ref 14–54)
AST: 24 U/L (ref 15–41)
Anion gap: 10 (ref 5–15)
BILIRUBIN TOTAL: 0.6 mg/dL (ref 0.3–1.2)
BUN: 6 mg/dL (ref 6–20)
CALCIUM: 8.7 mg/dL — AB (ref 8.9–10.3)
CO2: 28 mmol/L (ref 22–32)
Chloride: 101 mmol/L (ref 101–111)
Creatinine, Ser: 0.63 mg/dL (ref 0.44–1.00)
GFR calc Af Amer: >60 mL/min (ref >60)
GFR calc non Af Amer: >60 mL/min (ref >60)
GLUCOSE: 121 mg/dL — AB (ref 65–99)
Potassium: 3.5 mmol/L (ref 3.5–5.1)
Sodium: 139 mmol/L (ref 135–145)
TOTAL PROTEIN: 6.9 g/dL (ref 6.5–8.1)

## 2016-11-20 SURGERY — LAPAROSCOPIC CHOLECYSTECTOMY WITH INTRAOPERATIVE CHOLANGIOGRAM
Anesthesia: General

## 2016-11-20 MED ORDER — SUGAMMADEX SODIUM 200 MG/2ML IV SOLN
INTRAVENOUS | Status: AC
  Filled 2016-11-20: qty 2

## 2016-11-20 MED ORDER — PROPOFOL 10 MG/ML IV BOLUS
INTRAVENOUS | Status: DC | PRN
  Administered 2016-11-20: 150 mg via INTRAVENOUS

## 2016-11-20 MED ORDER — LACTATED RINGERS IR SOLN
Status: DC | PRN
  Administered 2016-11-20: 1000 mL

## 2016-11-20 MED ORDER — ROCURONIUM BROMIDE 50 MG/5ML IV SOSY
PREFILLED_SYRINGE | INTRAVENOUS | Status: AC
  Filled 2016-11-20: qty 5

## 2016-11-20 MED ORDER — ONDANSETRON HCL 4 MG/2ML IJ SOLN
INTRAMUSCULAR | Status: AC
  Filled 2016-11-20: qty 2

## 2016-11-20 MED ORDER — LACTATED RINGERS IV SOLN
INTRAVENOUS | Status: DC | PRN
  Administered 2016-11-20 (×2): via INTRAVENOUS

## 2016-11-20 MED ORDER — FENTANYL CITRATE (PF) 100 MCG/2ML IJ SOLN
INTRAMUSCULAR | Status: DC | PRN
  Administered 2016-11-20 (×5): 50 ug via INTRAVENOUS

## 2016-11-20 MED ORDER — PROPOFOL 10 MG/ML IV BOLUS
INTRAVENOUS | Status: AC
  Filled 2016-11-20: qty 20

## 2016-11-20 MED ORDER — LIDOCAINE 2% (20 MG/ML) 5 ML SYRINGE
INTRAMUSCULAR | Status: DC | PRN
  Administered 2016-11-20: 80 mg via INTRAVENOUS

## 2016-11-20 MED ORDER — 0.9 % SODIUM CHLORIDE (POUR BTL) OPTIME
TOPICAL | Status: DC | PRN
  Administered 2016-11-20: 1000 mL

## 2016-11-20 MED ORDER — PHENYLEPHRINE 40 MCG/ML (10ML) SYRINGE FOR IV PUSH (FOR BLOOD PRESSURE SUPPORT)
PREFILLED_SYRINGE | INTRAVENOUS | Status: AC
  Filled 2016-11-20: qty 10

## 2016-11-20 MED ORDER — LACTATED RINGERS IV SOLN: INTRAVENOUS | Status: DC

## 2016-11-20 MED ORDER — PHENYLEPHRINE 40 MCG/ML (10ML) SYRINGE FOR IV PUSH (FOR BLOOD PRESSURE SUPPORT)
PREFILLED_SYRINGE | INTRAVENOUS | Status: DC | PRN
  Administered 2016-11-20 (×3): 80 ug via INTRAVENOUS

## 2016-11-20 MED ORDER — ONDANSETRON HCL 4 MG/2ML IJ SOLN
INTRAMUSCULAR | Status: DC | PRN
Start: 2016-11-20 — End: 2016-11-20
  Administered 2016-11-20: 4 mg via INTRAVENOUS

## 2016-11-20 MED ORDER — SUCCINYLCHOLINE CHLORIDE 200 MG/10ML IV SOSY
PREFILLED_SYRINGE | INTRAVENOUS | Status: AC
  Filled 2016-11-20: qty 10

## 2016-11-20 MED ORDER — LABETALOL HCL 5 MG/ML IV SOLN
INTRAVENOUS | Status: DC | PRN
  Administered 2016-11-20: 5 mg via INTRAVENOUS

## 2016-11-20 MED ORDER — SUCCINYLCHOLINE CHLORIDE 200 MG/10ML IV SOSY
PREFILLED_SYRINGE | INTRAVENOUS | Status: DC | PRN
  Administered 2016-11-20: 120 mg via INTRAVENOUS

## 2016-11-20 MED ORDER — HYDROMORPHONE HCL-NACL 0.5-0.9 MG/ML-% IV SOSY: 0.2500 mg | PREFILLED_SYRINGE | INTRAVENOUS | Status: DC | PRN

## 2016-11-20 MED ORDER — BUPIVACAINE-EPINEPHRINE 0.25% -1:200000 IJ SOLN
INTRAMUSCULAR | Status: DC | PRN
  Administered 2016-11-20: 30 mL

## 2016-11-20 MED ORDER — DEXAMETHASONE SODIUM PHOSPHATE 10 MG/ML IJ SOLN
INTRAMUSCULAR | Status: DC | PRN
  Administered 2016-11-20: 10 mg via INTRAVENOUS

## 2016-11-20 MED ORDER — SCOPOLAMINE 1 MG/3DAYS TD PT72SCOPOLAMINE 1 MG/3DAYS
MEDICATED_PATCH | TRANSDERMAL | Status: DC | PRN
Start: 2016-11-20 — End: 2016-11-20
  Administered 2016-11-20: 1 via TRANSDERMAL

## 2016-11-20 MED ORDER — SCOPOLAMINE 1 MG/3DAYS TD PT72
MEDICATED_PATCH | TRANSDERMAL | Status: AC
  Filled 2016-11-20: qty 1

## 2016-11-20 MED ORDER — ROCURONIUM BROMIDE 50 MG/5ML IV SOSY
PREFILLED_SYRINGE | INTRAVENOUS | Status: DC | PRN
  Administered 2016-11-20: 40 mg via INTRAVENOUS
  Administered 2016-11-20 (×2): 10 mg via INTRAVENOUS

## 2016-11-20 MED ORDER — LIDOCAINE 2% (20 MG/ML) 5 ML SYRINGE
INTRAMUSCULAR | Status: AC
  Filled 2016-11-20: qty 5

## 2016-11-20 MED ORDER — HYDROCODONE-ACETAMINOPHEN 5-325 MG PO TABS
1.0000 | ORAL_TABLET | ORAL | Status: DC | PRN
  Administered 2016-11-21: 2 via ORAL
  Filled 2016-11-20: qty 2

## 2016-11-20 MED ORDER — IOPAMIDOL (ISOVUE-300) INJECTION 61%
INTRAVENOUS | Status: AC
  Filled 2016-11-20: qty 50

## 2016-11-20 MED ORDER — MEPERIDINE HCL 50 MG/ML IJ SOLN: 6.2500 mg | INTRAMUSCULAR | Status: DC | PRN

## 2016-11-20 MED ORDER — FENTANYL CITRATE (PF) 250 MCG/5ML IJ SOLN
INTRAMUSCULAR | Status: AC
  Filled 2016-11-20: qty 5

## 2016-11-20 MED ORDER — LABETALOL HCL 5 MG/ML IV SOLN
INTRAVENOUS | Status: AC
  Filled 2016-11-20: qty 4

## 2016-11-20 MED ORDER — IOPAMIDOL (ISOVUE-300) INJECTION 61%
INTRAVENOUS | Status: DC | PRN
  Administered 2016-11-20: 3 mL

## 2016-11-20 MED ORDER — BUPIVACAINE-EPINEPHRINE (PF) 0.25% -1:200000 IJ SOLN
INTRAMUSCULAR | Status: AC
  Filled 2016-11-20: qty 30

## 2016-11-20 MED ORDER — PROMETHAZINE HCL 25 MG/ML IJ SOLN: 6.2500 mg | INTRAMUSCULAR | Status: DC | PRN

## 2016-11-20 SURGICAL SUPPLY — 34 items
APPLICATOR ARISTA FLEXITIP XL (MISCELLANEOUS) ×3 IMPLANT
APPLIER CLIP 5 13 M/L LIGAMAX5 (MISCELLANEOUS) ×3
CABLE HIGH FREQUENCY MONO STRZ (ELECTRODE) ×3 IMPLANT
CHLORAPREP W/TINT 26ML (MISCELLANEOUS) ×3 IMPLANT
CLIP APPLIE 5 13 M/L LIGAMAX5 (MISCELLANEOUS) ×1 IMPLANT
COVER MAYO STAND STRL (DRAPES) ×3 IMPLANT
DERMABOND ADVANCED (GAUZE/BANDAGES/DRESSINGS) ×2
DERMABOND ADVANCED .7 DNX12 (GAUZE/BANDAGES/DRESSINGS) ×1 IMPLANT
DRAPE C-ARM 42X120 X-RAY (DRAPES) ×3 IMPLANT
ELECT REM PT RETURN 15FT ADLT (MISCELLANEOUS) ×3 IMPLANT
GLOVE BIO SURGEON STRL SZ 6.5 (GLOVE) ×2 IMPLANT
GLOVE BIO SURGEONS STRL SZ 6.5 (GLOVE) ×1
GLOVE BIOGEL PI IND STRL 7.0 (GLOVE) ×1 IMPLANT
GLOVE BIOGEL PI INDICATOR 7.0 (GLOVE) ×2
GOWN STRL REUS W/TWL 2XL LVL3 (GOWN DISPOSABLE) ×3 IMPLANT
GOWN STRL REUS W/TWL XL LVL3 (GOWN DISPOSABLE) ×6 IMPLANT
HEMOSTAT ARISTA ABSORB 3G PWDR (MISCELLANEOUS) ×3 IMPLANT
IRRIG SUCT STRYKERFLOW 2 WTIP (MISCELLANEOUS) ×3
IRRIGATION SUCT STRKRFLW 2 WTP (MISCELLANEOUS) ×1 IMPLANT
IV CATH 14GX2 1/4 (CATHETERS) ×3 IMPLANT
KIT BASIN OR (CUSTOM PROCEDURE TRAY) ×3 IMPLANT
POUCH SPECIMEN RETRIEVAL 10MM (ENDOMECHANICALS) ×3 IMPLANT
SCISSORS LAP 5X35 DISP (ENDOMECHANICALS) ×3 IMPLANT
SET CHOLANGIOGRAPH MIX (MISCELLANEOUS) ×3 IMPLANT
SLEEVE XCEL OPT CAN 5 100 (ENDOMECHANICALS) ×6 IMPLANT
SUT VIC AB 2-0 SH 27 (SUTURE) ×2
SUT VIC AB 2-0 SH 27X BRD (SUTURE) ×1 IMPLANT
SUT VIC AB 4-0 PS2 18 (SUTURE) ×6 IMPLANT
TOWEL OR 17X26 10 PK STRL BLUE (TOWEL DISPOSABLE) ×3 IMPLANT
TOWEL OR NON WOVEN STRL DISP B (DISPOSABLE) ×3 IMPLANT
TRAY LAPAROSCOPIC (CUSTOM PROCEDURE TRAY) ×3 IMPLANT
TROCAR XCEL BLUNT TIP 100MML (ENDOMECHANICALS) ×3 IMPLANT
TROCAR XCEL NON-BLD 5MMX100MML (ENDOMECHANICALS) ×3 IMPLANT
TUBING INSUF HEATED (TUBING) ×3 IMPLANT

## 2016-11-20 NOTE — Anesthesia Procedure Notes (Signed)
Procedure Name: Intubation Date/Time: 11/20/2016 7:37 AM Performed by: Montel Clock Pre-anesthesia Checklist: Patient identified, Emergency Drugs available, Suction available, Patient being monitored and Timeout performed Patient Re-evaluated:Patient Re-evaluated prior to induction Oxygen Delivery Method: Circle system utilized Preoxygenation: Pre-oxygenation with 100% oxygen Induction Type: IV induction, Cricoid Pressure applied and Rapid sequence Laryngoscope Size: Mac and 3 Grade View: Grade I Tube type: Oral Tube size: 7.0 mm Number of attempts: 1 Airway Equipment and Method: Stylet Placement Confirmation: ETT inserted through vocal cords under direct vision,  positive ETCO2 and breath sounds checked- equal and bilateral Secured at: 21 cm Tube secured with: Tape Dental Injury: Teeth and Oropharynx as per pre-operative assessment

## 2016-11-20 NOTE — Progress Notes (Signed)
Acute cholecystitis  Subjective: 66 y.o. F with RUQ pain and gallstones.  US shows minimal wall thickening and normal CBD.  LFT's wnl  Objective: Vital signs in last 24 hours: Temp:  [98.1 F (36.7 C)-99.5 F (37.5 C)] 98.6 F (37 C) (07/21 0549) Pulse Rate:  [69-82] 72 (07/21 0549) Resp:  [16-23] 18 (07/21 0549) BP: (113-175)/(48-93) 113/65 (07/21 0549) SpO2:  [94 %-99 %] 96 % (07/21 0549) Last BM Date: 11/19/16  Intake/Output from previous day: 07/20 0701 - 07/21 0700 In: 1060 [P.O.:120; I.V.:890; IV Piggyback:50] Out: 850 [Urine:850] Intake/Output this shift: No intake/output data recorded.  General appearance: alert and cooperative Resp: clear to auscultation bilaterally Cardio: regular rate and rhythm GI: normal findings: soft, non-tender  Lab Results:  Results for orders placed or performed during the hospital encounter of 11/19/16 (from the past 24 hour(s))  Hepatic function panel     Status: Abnormal   Collection Time: 11/19/16 10:10 AM  Result Value Ref Range   Total Protein 7.8 6.5 - 8.1 g/dL   Albumin 4.3 3.5 - 5.0 g/dL   AST 23 15 - 41 U/L   ALT 25 14 - 54 U/L   Alkaline Phosphatase 103 38 - 126 U/L   Total Bilirubin 0.5 0.3 - 1.2 mg/dL   Bilirubin, Direct <0.1 (L) 0.1 - 0.5 mg/dL   Indirect Bilirubin NOT CALCULATED 0.3 - 0.9 mg/dL  Lipase, blood     Status: None   Collection Time: 11/19/16 10:10 AM  Result Value Ref Range   Lipase 26 11 - 51 U/L  Basic metabolic panel     Status: Abnormal   Collection Time: 11/19/16 10:11 AM  Result Value Ref Range   Sodium 138 135 - 145 mmol/L   Potassium 3.6 3.5 - 5.1 mmol/L   Chloride 102 101 - 111 mmol/L   CO2 24 22 - 32 mmol/L   Glucose, Bld 128 (H) 65 - 99 mg/dL   BUN 18 6 - 20 mg/dL   Creatinine, Ser 0.63 0.44 - 1.00 mg/dL   Calcium 9.1 8.9 - 10.3 mg/dL   GFR calc non Af Amer >60 >60 mL/min   GFR calc Af Amer >60 >60 mL/min   Anion gap 12 5 - 15  CBC     Status: Abnormal   Collection Time: 11/19/16  10:11 AM  Result Value Ref Range   WBC 12.3 (H) 4.0 - 10.5 K/uL   RBC 5.15 (H) 3.87 - 5.11 MIL/uL   Hemoglobin 15.3 (H) 12.0 - 15.0 g/dL   HCT 43.6 36.0 - 46.0 %   MCV 84.7 78.0 - 100.0 fL   MCH 29.7 26.0 - 34.0 pg   MCHC 35.1 30.0 - 36.0 g/dL   RDW 13.3 11.5 - 15.5 %   Platelets 307 150 - 400 K/uL  POCT i-Stat troponin I     Status: None   Collection Time: 11/19/16 10:23 AM  Result Value Ref Range   Troponin i, poc 0.00 0.00 - 0.08 ng/mL   Comment 3          Surgical pcr screen     Status: None   Collection Time: 11/19/16 10:12 PM  Result Value Ref Range   MRSA, PCR NEGATIVE NEGATIVE   Staphylococcus aureus NEGATIVE NEGATIVE  Comprehensive metabolic panel     Status: Abnormal   Collection Time: 11/20/16  5:44 AM  Result Value Ref Range   Sodium 139 135 - 145 mmol/L   Potassium 3.5 3.5 - 5.1 mmol/L  Chloride 101 101 - 111 mmol/L   CO2 28 22 - 32 mmol/L   Glucose, Bld 121 (H) 65 - 99 mg/dL   BUN 6 6 - 20 mg/dL   Creatinine, Ser 0.63 0.44 - 1.00 mg/dL   Calcium 8.7 (L) 8.9 - 10.3 mg/dL   Total Protein 6.9 6.5 - 8.1 g/dL   Albumin 3.8 3.5 - 5.0 g/dL   AST 24 15 - 41 U/L   ALT 22 14 - 54 U/L   Alkaline Phosphatase 93 38 - 126 U/L   Total Bilirubin 0.6 0.3 - 1.2 mg/dL   GFR calc non Af Amer >60 >60 mL/min   GFR calc Af Amer >60 >60 mL/min   Anion gap 10 5 - 15  CBC     Status: Abnormal   Collection Time: 11/20/16  5:44 AM  Result Value Ref Range   WBC 11.3 (H) 4.0 - 10.5 K/uL   RBC 4.85 3.87 - 5.11 MIL/uL   Hemoglobin 14.3 12.0 - 15.0 g/dL   HCT 42.4 36.0 - 46.0 %   MCV 87.4 78.0 - 100.0 fL   MCH 29.5 26.0 - 34.0 pg   MCHC 33.7 30.0 - 36.0 g/dL   RDW 13.8 11.5 - 15.5 %   Platelets 278 150 - 400 K/uL     Studies/Results Radiology     MEDS, Scheduled . ALPRAZolam  0.5 mg Oral QHS  . enoxaparin (LOVENOX) injection  40 mg Subcutaneous Q24H  . losartan  50 mg Oral Daily  . pantoprazole (PROTONIX) IV  40 mg Intravenous QHS  . venlafaxine XR  150 mg Oral QODAY      Assessment: <principal problem not specified> Symptomatic cholelithiasis  Plan: Lap chole, possible IOC.  The anatomy & physiology of hepatobiliary & pancreatic function was discussed.  The pathophysiology of gallbladder dysfunction was discussed.  Natural history risks without surgery was discussed.   I feel the risks of no intervention will lead to serious problems that outweigh the operative risks; therefore, I recommended cholecystectomy to remove the pathology.  I explained laparoscopic techniques with possible need for an open approach.  Probable cholangiogram to evaluate the bilary tract was explained as well.    Risks such as bleeding, infection, abscess, leak, injury to other organs, need for further treatment, heart attack, death, and other risks were discussed.  I noted a good likelihood this will help address the problem.  Possibility that this will not correct all abdominal symptoms was explained.  Goals of post-operative recovery were discussed as well.  We will work to minimize complications.  An educational handout further explaining the pathology and treatment options was given as well.  Questions were answered.  The patient expresses understanding & wishes to proceed with surgery.  LOS: 0 days    Rosario Adie, MD [facility], [place]   11/20/2016 7:04 AM

## 2016-11-20 NOTE — Anesthesia Postprocedure Evaluation (Signed)
Anesthesia Post Note  Patient: Julia Zimmerman  Procedure(s) Performed: Procedure(s) (LRB): LAPAROSCOPIC CHOLECYSTECTOMY WITH INTRAOPERATIVE CHOLANGIOGRAM (N/A)     Patient location during evaluation: PACU Anesthesia Type: General Level of consciousness: awake and alert Pain management: pain level controlled Vital Signs Assessment: post-procedure vital signs reviewed and stable Respiratory status: spontaneous breathing, nonlabored ventilation, respiratory function stable and patient connected to nasal cannula oxygen Cardiovascular status: blood pressure returned to baseline and stable Postop Assessment: no signs of nausea or vomiting Anesthetic complications: no    Last Vitals:  Vitals:   11/20/16 1013 11/20/16 1100  BP: (!) 140/58 133/73  Pulse: 70 67  Resp: 14 14  Temp: 36.7 C 36.7 C    Last Pain:  Vitals:   11/20/16 1100  TempSrc:   PainSc: 0-No pain                 Effie Berkshire

## 2016-11-20 NOTE — Op Note (Signed)
11/19/2016 - 11/20/2016  8:58 AM  PATIENT:  Julia Zimmerman  66 y.o. female  Patient Care Team: Plotnikov, Evie Lacks, MD as PCP - General (Internal Medicine)  PRE-OPERATIVE DIAGNOSIS:  symptomatic cholelithiasis  POST-OPERATIVE DIAGNOSIS:  cholecystitis  PROCEDURE: LAPAROSCOPIC CHOLECYSTECTOMY WITH INTRAOPERATIVE CHOLANGIOGRAM    Surgeon(s): Leighton Ruff, MD Johnathan Hausen, MD  ASSISTANT: Dr Hassell Done   ANESTHESIA:   local and general  EBL:  Total I/O In: -  Out: 100 [Blood:100]  DRAINS: none   SPECIMEN:  Source of Specimen:  gallbladder  DISPOSITION OF SPECIMEN:  PATHOLOGY  COUNTS:  YES  PLAN OF CARE: admitted  PATIENT DISPOSITION:  PACU - hemodynamically stable.  INDICATION: 66 y.o. F with acute onset of RUQ pain  The anatomy & physiology of hepatobiliary & pancreatic function was discussed.  The pathophysiology of gallbladder dysfunction was discussed.  Natural history risks without surgery was discussed.   I feel the risks of no intervention will lead to serious problems that outweigh the operative risks; therefore, I recommended cholecystectomy to remove the pathology.  I explained laparoscopic techniques with possible need for an open approach.  Probable cholangiogram to evaluate the bilary tract was explained as well.    Risks such as bleeding, infection, abscess, leak, injury to other organs, need for further treatment, heart attack, death, and other risks were discussed.  I noted a good likelihood this will help address the problem.  Possibility that this will not correct all abdominal symptoms was explained.  Goals of post-operative recovery were discussed as well.    OR FINDINGS: acutely inflamed gallbladder, normal IOC  DESCRIPTION:   The patient was identified & brought into the operating room. The patient was positioned supine with arms tucked. SCDs were active during the entire case. The patient underwent general anesthesia without any difficulty.  The  abdomen was prepped and draped in a sterile fashion. A Surgical Timeout was performed and confirmed our plan.  We positioned the patient in reverse [place] & right side up.  I placed a Hassan laparoscopic port through the umbilicus using open entry technique.  Entry was clean. There were no adhesions to the anterior abdominal wall supraumbilically.  We induced carbon dioxide insufflation. Camera inspection revealed no injury.    I proceeded to continue with laparoscopic technique. I placed a 5 mm port in mid subcostal region, another 38mm port in the right flank near the anterior axillary line, and a 81mm port in the left subxiphoid region obliquely within the falciform ligament.  I turned attention to the right upper quadrant.  The gallbladder was significantly distended and inflamed.  It was aspirated with a needle aspirator device.  This returned bile.  The gallbladder fundus was then elevated cephalad. I used cautery and blunt dissection to free the peritoneal coverings and omentum between the gallbladder and the liver on the posteriolateral and anteriomedial walls.   I used careful blunt and cautery dissection with a blunt dissector to help get a good critical view of the cystic artery and cystic duct. I did further dissection to free a few centimeters of the  gallbladder off the liver bed to get a good critical view of the infundibulum and cystic duct. I mobilized the cystic artery.  I skeletonized the cystic duct.  After getting a good 360 view, I decided to perform a cholangiogram.  I placed a clip on the infundibulum.   I did a partial cystic duct-otomy and ensured patency. I placed a 5 F cholangiocatheter through a  puncture site at the right subcostal ridge of the abdominal wall and directed it into the cystic duct.  This was secured with a clip. We ran a cholangiogram with dilute radio-opaque contrast and continuous fluoroscopy.  Contrast flowed from a side branch consistent with cystic duct  cannulization. Contrast flowed up the common hepatic duct into the right and left intrahepatic chains out to secondary radicals. Contrast flowed down the common bile duct easily across the normal ampulla into the duodenum.  This was consistent with a normal cholangiogram.  I removed the cholangiocatheter.  I placed clips on the cystic duct x3.  I completed cystic duct transection.   I placed clips on the cystic artery x3 with 2 proximally.  I ligated the cystic artery using scissors. I freed the gallbladder from its remaining attachments to the liver. I ensured hemostasis on the gallbladder fossa of the liver and elsewhere using electrocautery. I inspected the rest of the abdomen & detected no injury nor bleeding elsewhere.  I irrigated the RUQ with normal saline.  I placed Arixtra in the liver bed for added hemostasis.  I removed the gallbladder through the umbilical port site.  I closed the umbilical fascia using 0 Vicryl stitches .   I closed the skin using 4-0 vicryl stitch.  Sterile dressings were applied. The patient was extubated & arrived in the PACU in stable condition.  I had discussed postoperative care with the patient in the holding area.   I will discuss  operative findings and postoperative goals / instructions with the patient's family.  Instructions are written in the chart as well.

## 2016-11-20 NOTE — Transfer of Care (Signed)
Immediate Anesthesia Transfer of Care Note  Patient: Julia Zimmerman  Procedure(s) Performed: Procedure(s): LAPAROSCOPIC CHOLECYSTECTOMY WITH INTRAOPERATIVE CHOLANGIOGRAM (N/A)  Patient Location: PACU  Anesthesia Type:General  Level of Consciousness:  sedated, patient cooperative and responds to stimulation  Airway & Oxygen Therapy:Patient Spontanous Breathing and Patient connected to face mask oxgen  Post-op Assessment:  Report given to PACU RN and Post -op Vital signs reviewed and stable  Post vital signs:  Reviewed and stable  Last Vitals:  Vitals:   11/19/16 2330 11/20/16 0549  BP: (!) 114/48 113/65  Pulse: 76 72  Resp: 18 18  Temp: 03.7 C 37 C    Complications: No apparent anesthesia complications

## 2016-11-21 ENCOUNTER — Encounter (HOSPITAL_COMMUNITY): Payer: Self-pay | Admitting: General Surgery

## 2016-11-21 DIAGNOSIS — Z833 Family history of diabetes mellitus: Secondary | ICD-10-CM | POA: Diagnosis not present

## 2016-11-21 DIAGNOSIS — Z882 Allergy status to sulfonamides status: Secondary | ICD-10-CM | POA: Diagnosis not present

## 2016-11-21 DIAGNOSIS — Z885 Allergy status to narcotic agent status: Secondary | ICD-10-CM | POA: Diagnosis not present

## 2016-11-21 DIAGNOSIS — E785 Hyperlipidemia, unspecified: Secondary | ICD-10-CM | POA: Diagnosis present

## 2016-11-21 DIAGNOSIS — F819 Developmental disorder of scholastic skills, unspecified: Secondary | ICD-10-CM | POA: Diagnosis present

## 2016-11-21 DIAGNOSIS — Z888 Allergy status to other drugs, medicaments and biological substances status: Secondary | ICD-10-CM | POA: Diagnosis not present

## 2016-11-21 DIAGNOSIS — I10 Essential (primary) hypertension: Secondary | ICD-10-CM | POA: Diagnosis present

## 2016-11-21 DIAGNOSIS — Z8261 Family history of arthritis: Secondary | ICD-10-CM | POA: Diagnosis not present

## 2016-11-21 DIAGNOSIS — Z8249 Family history of ischemic heart disease and other diseases of the circulatory system: Secondary | ICD-10-CM | POA: Diagnosis not present

## 2016-11-21 DIAGNOSIS — K219 Gastro-esophageal reflux disease without esophagitis: Secondary | ICD-10-CM | POA: Diagnosis present

## 2016-11-21 DIAGNOSIS — Z87891 Personal history of nicotine dependence: Secondary | ICD-10-CM | POA: Diagnosis not present

## 2016-11-21 DIAGNOSIS — R1011 Right upper quadrant pain: Secondary | ICD-10-CM | POA: Diagnosis present

## 2016-11-21 DIAGNOSIS — K8012 Calculus of gallbladder with acute and chronic cholecystitis without obstruction: Secondary | ICD-10-CM | POA: Diagnosis present

## 2016-11-21 DIAGNOSIS — F329 Major depressive disorder, single episode, unspecified: Secondary | ICD-10-CM | POA: Diagnosis present

## 2016-11-21 MED ORDER — PANTOPRAZOLE SODIUM 40 MG PO TBEC
40.0000 mg | DELAYED_RELEASE_TABLET | Freq: Every day | ORAL | Status: DC
  Administered 2016-11-21: 40 mg via ORAL
  Filled 2016-11-21: qty 1

## 2016-11-21 NOTE — Progress Notes (Signed)
Patient ID: Julia Zimmerman, female   DOB: 01/17/1951, 66 y.o.   MRN: [id number] [facility] Surgery Progress Note:   1 Day Post-Op  Subjective: Mental status is clear.  Feeling worse today Objective: Vital signs in last 24 hours: Temp:  [98 F (36.7 C)-100.5 F (38.1 C)] 100.5 F (38.1 C) (07/22 0539) Pulse Rate:  [66-97] 97 (07/22 0539) Resp:  [13-18] 16 (07/22 0539) BP: (119-145)/(58-86) 144/86 (07/22 0539) SpO2:  [94 %-100 %] 95 % (07/22 0539)  Intake/Output from previous day: 07/21 0701 - 07/22 0700 In: 4050 [I.V.:4050] Out: 1800 [Urine:1700; Blood:100] Intake/Output this shift: No intake/output data recorded.  Physical Exam: Work of breathing is normal.  Abdominal pain as expected after chole.    Lab Results:  Results for orders placed or performed during the hospital encounter of 11/19/16 (from the past 48 hour(s))  Hepatic function panel     Status: Abnormal   Collection Time: 11/19/16 10:10 AM  Result Value Ref Range   Total Protein 7.8 6.5 - 8.1 g/dL   Albumin 4.3 3.5 - 5.0 g/dL   AST 23 15 - 41 U/L   ALT 25 14 - 54 U/L   Alkaline Phosphatase 103 38 - 126 U/L   Total Bilirubin 0.5 0.3 - 1.2 mg/dL   Bilirubin, Direct <0.1 (L) 0.1 - 0.5 mg/dL   Indirect Bilirubin NOT CALCULATED 0.3 - 0.9 mg/dL  Lipase, blood     Status: None   Collection Time: 11/19/16 10:10 AM  Result Value Ref Range   Lipase 26 11 - 51 U/L  Basic metabolic panel     Status: Abnormal   Collection Time: 11/19/16 10:11 AM  Result Value Ref Range   Sodium 138 135 - 145 mmol/L   Potassium 3.6 3.5 - 5.1 mmol/L   Chloride 102 101 - 111 mmol/L   CO2 24 22 - 32 mmol/L   Glucose, Bld 128 (H) 65 - 99 mg/dL   BUN 18 6 - 20 mg/dL   Creatinine, Ser 0.63 0.44 - 1.00 mg/dL   Calcium 9.1 8.9 - 10.3 mg/dL   GFR calc non Af Amer >60 >60 mL/min   GFR calc Af Amer >60 >60 mL/min    Comment: (NOTE) The eGFR has been calculated using the CKD EPI equation. This calculation has not been validated in all  clinical situations. eGFR's persistently <60 mL/min signify possible Chronic Kidney Disease.    Anion gap 12 5 - 15  CBC     Status: Abnormal   Collection Time: 11/19/16 10:11 AM  Result Value Ref Range   WBC 12.3 (H) 4.0 - 10.5 K/uL   RBC 5.15 (H) 3.87 - 5.11 MIL/uL   Hemoglobin 15.3 (H) 12.0 - 15.0 g/dL   HCT 43.6 36.0 - 46.0 %   MCV 84.7 78.0 - 100.0 fL   MCH 29.7 26.0 - 34.0 pg   MCHC 35.1 30.0 - 36.0 g/dL   RDW 13.3 11.5 - 15.5 %   Platelets 307 150 - 400 K/uL  POCT i-Stat troponin I     Status: None   Collection Time: 11/19/16 10:23 AM  Result Value Ref Range   Troponin i, poc 0.00 0.00 - 0.08 ng/mL   Comment 3            Comment: Due to the release kinetics of cTnI, a negative result within the first hours of the onset of symptoms does not rule out myocardial infarction with certainty. If myocardial infarction is still suspected, repeat the  test at appropriate intervals.   Surgical pcr screen     Status: None   Collection Time: 11/19/16 10:12 PM  Result Value Ref Range   MRSA, PCR NEGATIVE NEGATIVE   Staphylococcus aureus NEGATIVE NEGATIVE    Comment:        The Xpert SA Assay (FDA approved for NASAL specimens in patients over 66 years of age), is one component of a comprehensive surveillance program.  Test performance has been validated by [facility] for patients greater than or equal to 47 year old. It is not intended to diagnose infection nor to guide or monitor treatment.   Comprehensive metabolic panel     Status: Abnormal   Collection Time: 11/20/16  5:44 AM  Result Value Ref Range   Sodium 139 135 - 145 mmol/L   Potassium 3.5 3.5 - 5.1 mmol/L   Chloride 101 101 - 111 mmol/L   CO2 28 22 - 32 mmol/L   Glucose, Bld 121 (H) 65 - 99 mg/dL   BUN 6 6 - 20 mg/dL   Creatinine, Ser 0.63 0.44 - 1.00 mg/dL   Calcium 8.7 (L) 8.9 - 10.3 mg/dL   Total Protein 6.9 6.5 - 8.1 g/dL   Albumin 3.8 3.5 - 5.0 g/dL   AST 24 15 - 41 U/L   ALT 22 14 - 54 U/L    Alkaline Phosphatase 93 38 - 126 U/L   Total Bilirubin 0.6 0.3 - 1.2 mg/dL   GFR calc non Af Amer >60 >60 mL/min   GFR calc Af Amer >60 >60 mL/min    Comment: (NOTE) The eGFR has been calculated using the CKD EPI equation. This calculation has not been validated in all clinical situations. eGFR's persistently <60 mL/min signify possible Chronic Kidney Disease.    Anion gap 10 5 - 15  CBC     Status: Abnormal   Collection Time: 11/20/16  5:44 AM  Result Value Ref Range   WBC 11.3 (H) 4.0 - 10.5 K/uL   RBC 4.85 3.87 - 5.11 MIL/uL   Hemoglobin 14.3 12.0 - 15.0 g/dL   HCT 42.4 36.0 - 46.0 %   MCV 87.4 78.0 - 100.0 fL   MCH 29.5 26.0 - 34.0 pg   MCHC 33.7 30.0 - 36.0 g/dL   RDW 13.8 11.5 - 15.5 %   Platelets 278 150 - 400 K/uL    Radiology/Results: Dg Chest 2 View  Result Date: 11/19/2016 CLINICAL DATA:  Mid chest pain since last evening. EXAM: CHEST  2 VIEW COMPARISON:  06/19/2015; 07/28/2006; 03/18/2009 FINDINGS: Grossly unchanged cardiac silhouette and mediastinal contours. Bilateral infrahilar opacities are unchanged and favored to represent atelectasis or scar. No new focal airspace opacities. No pleural effusion or pneumothorax. No evidence of edema. No acute osseous abnormalities. IMPRESSION: No acute cardiopulmonary disease. Electronically Signed   By: Sandi Mariscal M.D.   On: 11/19/2016 10:16   Dg Cholangiogram Operative  Result Date: 11/20/2016 CLINICAL DATA:  66 year old female with cholecystitis EXAM: INTRAOPERATIVE CHOLANGIOGRAM TECHNIQUE: Cholangiographic images from the C-arm fluoroscopic device were submitted for interpretation post-operatively. Please see the procedural report for the amount of contrast and the fluoroscopy time utilized. COMPARISON:  Abdominal ultrasound 11/19/2016 FINDINGS: Cine clip obtained during intraoperative cholangiogram at the time the laparoscopic cholecystectomy. The images demonstrate cannulation of the cystic duct remnant and opacification of  the biliary tree. No evidence of biliary ductal dilatation, stenosis, stricture or choledocholithiasis. Contrast material passes freely through the ampulla and into the duodenum. IMPRESSION: Negative intraoperative cholangiogram.  Electronically Signed   By: Jacqulynn Cadet M.D.   On: 11/20/2016 09:27   US Abdomen Limited  Result Date: 11/19/2016 CLINICAL DATA:  66 year old female with right upper quadrant abdominal pain since 0100 hours. EXAM: ULTRASOUND ABDOMEN LIMITED RIGHT UPPER QUADRANT COMPARISON:  Chest CT 03/18/2009. FINDINGS: Gallbladder: Shadowing 28 mm gallstone within the neck of the gallbladder (image 8). The stone seems to be nonmobile on these images. Gallbladder wall thickness remains normal, and no pericholecystic fluid is identified. The patient had received pain medication prior to this exam, and no sonographic Murphy sign was elicited. Common Bile Duct: 7 mm, upper limits of normal to mildly dilated, although a 6-7 mm CBD is demonstrated on the 2010 chest CT. Liver: No intrahepatic biliary ductal dilatation. No discrete liver lesion. Hepatic echogenicity within normal limits. Other findings: Negative visible right kidney. IMPRESSION: 1. Gallstone measuring 2.8 cm which might be lodged in the neck of the gallbladder. Equivocal sonographic Percell Miller sign due to prior pain medication, but no other ultrasound features of acute cholecystitis. 2. CBD at the upper limits of normal. No intrahepatic biliary ductal dilatation to strongly suggest acute biliary obstruction. Electronically Signed   By: Genevie Ann M.D.   On: 11/19/2016 12:35    Anti-infectives: Anti-infectives    Start     Dose/Rate Route Frequency Ordered Stop   11/19/16 1530  cefTRIAXone (ROCEPHIN) 2 g in dextrose 5 % 50 mL IVPB     2 g 100 mL/hr over 30 Minutes Intravenous Every 24 hours 11/19/16 1438        Assessment/Plan: Problem List: Patient Active Problem List   Diagnosis Date Noted  . Symptomatic cholelithiasis  11/19/2016  . Insomnia 04/23/2016  . Arthralgia 03/12/2016  . Pain in joint, shoulder region 03/12/2016  . Left upper arm pain 07/15/2015  . Chest pain 07/15/2015  . 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 Acute sinusitis 01/15/2015  . Well adult exam 01/02/2014  . Headache 01/02/2014  . Obesity 09/17/2013  . HYPERLIPIDEMIA 05/15/2010  . Essential hypertension, benign 05/15/2010  . RHINITIS 04/21/2010  . GERD 04/21/2010  . ADD 10/30/2008  . Palpitations 11/07/2007  . ANXIETY DEPRESSION 03/29/2007    Not ready for discharge today;  Hopeful discharge tomorrow 1 Day Post-Op    LOS: 0 days   Matt B. Hassell Done, MD, [facility] [phone] beeper [phone]  11/21/2016 9:21 AM

## 2016-11-22 MED ORDER — HYDROCODONE-ACETAMINOPHEN 5-325 MG PO TABS: 1.0000 | ORAL_TABLET | ORAL | 0 refills | Status: DC | PRN

## 2016-11-22 NOTE — Discharge Summary (Signed)
[facility] Discharge Summary   Patient ID: Julia Zimmerman MRN: [id number] DOB/AGE: [birth year] 66 y.o.  Admit date: 11/19/2016 Discharge date: 11/22/2016  Admitting Diagnosis: Abdominal pain  Discharge Diagnosis Patient Active Problem List   Diagnosis Date Noted  . Symptomatic cholelithiasis 11/19/2016  . Insomnia 04/23/2016  . Arthralgia 03/12/2016  . Pain in joint, shoulder region 03/12/2016  . Left upper arm pain 07/15/2015  . Chest pain 07/15/2015  . Complicated grief 40/98/1191  . Acute sinusitis 01/15/2015  . Well adult exam 01/02/2014  . Headache 01/02/2014  . Obesity 09/17/2013  . HYPERLIPIDEMIA 05/15/2010  . Essential hypertension, benign 05/15/2010  . RHINITIS 04/21/2010  . GERD 04/21/2010  . ADD 10/30/2008  . Palpitations 11/07/2007  . ANXIETY DEPRESSION 03/29/2007    Consultants none  Imaging: Dg Cholangiogram Operative  Result Date: 11/20/2016 CLINICAL DATA:  66 year old female with cholecystitis EXAM: INTRAOPERATIVE CHOLANGIOGRAM TECHNIQUE: Cholangiographic images from the C-arm fluoroscopic device were submitted for interpretation post-operatively. Please see the procedural report for the amount of contrast and the fluoroscopy time utilized. COMPARISON:  Abdominal ultrasound 11/19/2016 FINDINGS: Cine clip obtained during intraoperative cholangiogram at the time the laparoscopic cholecystectomy. The images demonstrate cannulation of the cystic duct remnant and opacification of the biliary tree. No evidence of biliary ductal dilatation, stenosis, stricture or choledocholithiasis. Contrast material passes freely through the ampulla and into the duodenum. IMPRESSION: Negative intraoperative cholangiogram. Electronically Signed   By: Jacqulynn Cadet M.D.   On: 11/20/2016 09:27    Procedures Dr. Marcello Moores (11/20/16) - Laparoscopic Cholecystectomy with [place] Hospital Course:  Julia Zimmerman who presented to [facility] with abdominal pain.  Workup  showed cholecystitis.  Patient was admitted and underwent procedure listed above.  Tolerated procedure well and was transferred to the floor.  Diet was advanced as tolerated.  On POD#2, the patient was voiding well, tolerating diet, ambulating well, pain well controlled, vital signs stable, incisions c/d/i and felt stable for discharge home.  Patient will follow up in our office in 2 weeks and knows to call with questions or concerns.  She will call to confirm appointment date/time.    Patient was discharged in good condition.  The [place] Substance controlled database was reviewed prior to prescribing narcotic pain medication to this patient.  Physical Exam: General:  Alert, NAD, pleasant, cooperative, well appearing Cardio: RRR, S1 & S2 normal, no murmur, rubs, gallops Resp: Effort normal, lungs CTA bilaterally anteriorly, no wheezes, rales, rhonchi Abd:  Soft, ND, normal bowel sounds, mild TTP around incisions, glue intact of incisions, appear well healing without signs of infection, mild blanching erythema and ecchymosis inferior to umbilicus, no erythema around incision of umbilicus. Skin: warm and dry  Allergies as of 11/22/2016      Reactions   Sertraline Hcl Hives, Itching   Hydrocodone Nausea Only   Sulfa Antibiotics Hives   Sulfamethoxazole Hives   Codeine Nausea Only      Medication List    TAKE these medications   alprazolam 2 MG tablet Commonly known as:  XANAX TAKE 1 TABLET 3 TIMES A DAY AS NEEDED FOR ANXIETY   butalbital-acetaminophen-caffeine 50-325-40 MG tablet Commonly known as:  FIORICET, ESGIC TAKE 1 TABLET EVERY 6 HOURSAS NEEDED FOR HEADACHE What changed:  how much to take  when to take this  reasons to take this  additional instructions   cholecalciferol 1000 units tablet Commonly known as:  VITAMIN D Take 1,000 Units by mouth daily.   hydrochlorothiazide 12.5 MG capsule Commonly known as:  MICROZIDE Take 1 capsule (12.5 mg total) by mouth  daily.   HYDROcodone-acetaminophen 5-325 MG tablet Commonly known as:  NORCO/VICODIN Take 1 tablet by mouth every 4 (four) hours as needed for moderate pain or severe pain.   losartan 50 MG tablet Commonly known as:  COZAAR Take 50 mg by mouth daily.   omega-3 acid ethyl esters 1 g capsule Commonly known as:  LOVAZA Take 1 g by mouth daily.   omeprazole 20 MG capsule Commonly known as:  PRILOSEC Take 1 capsule (20 mg total) by mouth daily.   venlafaxine XR 150 MG 24 hr capsule Commonly known as:  EFFEXOR-XR Take 1 capsule (150 mg total) by mouth daily with breakfast. 1 cap bid for depression What changed:  when to take this  additional instructions   Vitamin B-12 500 MCG Subl 1 sl qd What changed:  how much to take  how to take this  when to take this  additional instructions        Follow-up [facility] Call.   Specialty:  General Surgery Why:  to confirm your appointment date and time for follow up Contact information: [address] [place] [phone]          Signed: [facility] 11/22/2016, 8:32 AM Pager: [phone] Consults: [phone] Mon-Fri 7:00 am-4:30 pm Sat-Sun 7:00 am-11:30 am

## 2016-11-22 NOTE — Discharge Instructions (Signed)
Please arrive at least 30 min before your appointment to complete your check in paperwork.  If you are unable to arrive 30 min prior to your appointment time we may have to cancel or reschedule you.  LAPAROSCOPIC SURGERY: POST OP INSTRUCTIONS  1. DIET: Follow a light bland diet the first 24 hours after arrival home, such as soup, liquids, crackers, etc. Be sure to include lots of fluids daily. Avoid fast food or heavy meals as your are more likely to get nauseated. Eat a low fat the next few days after surgery.  2. Take your usually prescribed home medications unless otherwise directed. 3. PAIN CONTROL:  1. Pain is best controlled by a usual combination of three different methods TOGETHER:  1. Ice/Heat 2. Over the counter pain medication 3. Prescription pain medication 2. Most patients will experience some swelling and bruising around the incisions. Ice packs or heating pads (30-60 minutes up to 6 times a day) will help. Use ice for the first few days to help decrease swelling and bruising, then switch to heat to help relax tight/sore spots and speed recovery. Some people prefer to use ice alone, heat alone, alternating between ice & heat. Experiment to what works for you. Swelling and bruising can take several weeks to resolve.  3. It is helpful to take an over-the-counter pain medication regularly for the first few weeks. Choose one of the following that works best for you:  1. Naproxen (Aleve, etc) Two 220mg  tabs twice a day 2. Ibuprofen (Advil, etc) Three 200mg  tabs four times a day (every meal & bedtime) 3. Acetaminophen (Tylenol, etc) 500-650mg  four times a day (every meal & bedtime) 4. A prescription for pain medication (such as oxycodone, hydrocodone, etc) should be given to you upon discharge. Take your pain medication as prescribed. DO NOT TAKE ACETAMINOPHEN or any drug containing it ALONG WITH THE NORCO.  1. If you are having problems/concerns with the prescription medicine (does not  control pain, nausea, vomiting, rash, itching, etc), please call us [phone] to see if we need to switch you to a different pain medicine that will work better for you and/or control your side effect better. 2. If you need a refill on your pain medication, please contact your pharmacy. They will contact our office to request authorization. Prescriptions will not be filled after 5 pm or on week-ends. 4. Avoid getting constipated. Between the surgery and the pain medications, it is common to experience some constipation. Increasing fluid intake and taking a fiber supplement (such as Metamucil, Citrucel, FiberCon, MiraLax, etc) 1-2 times a day regularly will usually help prevent this problem from occurring. A mild laxative (prune juice, Milk of Magnesia, MiraLax, etc) should be taken according to package directions if there are no bowel movements after 48 hours.  5. Watch out for diarrhea. If you have many loose bowel movements, simplify your diet to bland foods & liquids for a few days. Stop any stool softeners and decrease your fiber supplement. Switching to mild anti-diarrheal medications (Kayopectate, Pepto Bismol) can help. If this worsens or does not improve, please call us. 6. Wash / shower every day. You may shower over the glue as it is waterproof.   7. ACTIVITIES as tolerated:  1. You may resume regular (light) daily activities beginning the next day--such as daily self-care, walking, climbing stairs--gradually increasing activities as tolerated. If you can walk 30 minutes without difficulty, it is safe to try more intense activity such as jogging, treadmill, bicycling, low-impact aerobics,  swimming, etc. 2. Save the most intensive and strenuous activity for last such as sit-ups, heavy lifting, contact sports, etc Refrain from any heavy lifting or straining until you are off narcotics for pain control.  3. DO NOT PUSH THROUGH PAIN. Let pain be your guide: If it hurts to do something, don't do  it. Pain is your body warning you to avoid that activity for another week until the pain goes down. 4. You may drive when you are no longer taking prescription pain medication, you can comfortably wear a seatbelt, and you can safely maneuver your car and apply brakes. 5. You may have sexual intercourse when it is comfortable.  8. FOLLOW UP in our office  1. Please call [facility] at [phone] to set up an appointment to see your surgeon in the office for a follow-up appointment approximately 2-3 weeks after your surgery. 2. Make sure that you call for this appointment the day you arrive home to insure a convenient appointment time.      10. IF YOU HAVE DISABILITY OR FAMILY LEAVE FORMS, BRING THEM TO THE               OFFICE FOR PROCESSING.   WHEN TO CALL us [phone]:  1. Poor pain control 2. Reactions / problems with new medications (rash/itching, nausea, etc)  3. Fever over 101.5 F (38.5 C) 4. Inability to urinate 5. Nausea and/or vomiting 6. Worsening swelling or bruising 7. Continued bleeding from incision. 8. Increased pain, redness, or drainage from the incision  The clinic staff is available to answer your questions during regular business hours (8:30am-5pm). Please dont hesitate to call and ask to speak to one of our nurses for clinical concerns.  If you have a medical emergency, go to the nearest emergency room or call 911.  A surgeon from [facility] is always on call at the [facility], [place], [place], [place], [place] [address] ?  MAIN: [phone] ? TOLL FREE: [phone] ?  FAX [phone]) [phone]  [email or web address]

## 2016-11-22 NOTE — Discharge Planning (Addendum)
Patient IV removed.  Discharge papers given, explained and educated.  Teach-back used to informed of post-op care after OR.  Signed pain script given.  Informed of suggested FU appt and appt made.  Work note given. RN assessment and VS revealed stability for DC to home. Patient advanced to reg diet and tolerated sandwich well. Once ready, will be walked to front and family transporting home via car.

## 2016-11-23 ENCOUNTER — Other Ambulatory Visit: Payer: Self-pay | Admitting: Internal Medicine

## 2016-11-26 ENCOUNTER — Encounter (HOSPITAL_COMMUNITY): Payer: Self-pay | Admitting: General Surgery

## 2016-11-30 ENCOUNTER — Other Ambulatory Visit: Payer: Self-pay | Admitting: Internal Medicine

## 2016-12-01 LAB — HIV ANTIBODY (ROUTINE TESTING W REFLEX): HIV Screen 4th Generation wRfx: NONREACTIVE

## 2016-12-02 NOTE — Telephone Encounter (Signed)
Pt called checking on this refill. Said that the pharmacy has not received it.

## 2016-12-02 NOTE — Telephone Encounter (Signed)
OK to fill this/these prescription(s) with additional refills x1 Needs to sch OV Thank you!

## 2016-12-02 NOTE — Telephone Encounter (Signed)
Refill is still pending waiting on MD approval. Dr. Alain Marion is this ok to renew...Julia Zimmerman

## 2016-12-03 NOTE — Telephone Encounter (Signed)
Called pt no answer LMOM refill has been called into [facility]lmb

## 2017-01-06 ENCOUNTER — Other Ambulatory Visit: Payer: Self-pay | Admitting: Internal Medicine

## 2017-01-17 NOTE — Telephone Encounter (Signed)
OV q 6 mo

## 2017-01-28 ENCOUNTER — Encounter (HOSPITAL_COMMUNITY): Payer: Self-pay | Admitting: *Deleted

## 2017-02-02 ENCOUNTER — Telehealth: Payer: Self-pay | Admitting: Internal Medicine

## 2017-02-07 NOTE — Telephone Encounter (Signed)
Pt called checking on this refill. I told her that it was denied because she needed a visit. She said that she did not know that she needed to follow up with Dr Alain Marion before she was due for her physical because he referred her to cardiology and she has been seeing Dr Harrington Challenger. She is scheduled for her physical on Mar 16, 2017. Would this be able to be refilled for her, or would he like her to come in prior for a med refill visit?

## 2017-02-08 MED ORDER — HYDROCHLOROTHIAZIDE 12.5 MG PO CAPS: 12.5000 mg | ORAL_CAPSULE | Freq: Every day | ORAL | 0 refills | Status: DC

## 2017-02-08 NOTE — Addendum Note (Signed)
Addended by: Karren Cobble on: 02/08/2017 09:20 AM   Modules accepted: Orders

## 2017-02-08 NOTE — Telephone Encounter (Signed)
RX sent

## 2017-03-02 ENCOUNTER — Other Ambulatory Visit: Payer: Self-pay | Admitting: Internal Medicine

## 2017-03-04 LAB — HM MAMMOGRAPHY

## 2017-03-10 ENCOUNTER — Encounter: Payer: Self-pay | Admitting: Internal Medicine

## 2017-03-16 ENCOUNTER — Encounter: Payer: Self-pay | Admitting: Internal Medicine

## 2017-03-16 ENCOUNTER — Ambulatory Visit (INDEPENDENT_AMBULATORY_CARE_PROVIDER_SITE_OTHER): Payer: BLUE CROSS/BLUE SHIELD | Admitting: Internal Medicine

## 2017-03-16 VITALS — BP 110/80 | HR 98 | Temp 98.1°F | Ht 62.0 in | Wt 158.2 lb

## 2017-03-16 DIAGNOSIS — F341 Dysthymic disorder: Secondary | ICD-10-CM | POA: Diagnosis not present

## 2017-03-16 DIAGNOSIS — I10 Essential (primary) hypertension: Secondary | ICD-10-CM | POA: Diagnosis not present

## 2017-03-16 DIAGNOSIS — Z23 Encounter for immunization: Secondary | ICD-10-CM | POA: Diagnosis not present

## 2017-03-16 DIAGNOSIS — R232 Flushing: Secondary | ICD-10-CM

## 2017-03-16 DIAGNOSIS — F5101 Primary insomnia: Secondary | ICD-10-CM

## 2017-03-16 MED ORDER — LOSARTAN POTASSIUM 50 MG PO TABS: 50.0000 mg | ORAL_TABLET | Freq: Every day | ORAL | 3 refills | Status: DC

## 2017-03-16 MED ORDER — VENLAFAXINE HCL ER 150 MG PO CP24: 150.0000 mg | ORAL_CAPSULE | Freq: Every day | ORAL | 3 refills | Status: DC

## 2017-03-16 MED ORDER — HYDROCHLOROTHIAZIDE 12.5 MG PO CAPS: 12.5000 mg | ORAL_CAPSULE | Freq: Every day | ORAL | 3 refills | Status: DC

## 2017-03-16 NOTE — Addendum Note (Signed)
Addended by: Karle Barr on: 03/16/2017 04:39 PM   Modules accepted: Orders

## 2017-03-16 NOTE — Assessment & Plan Note (Signed)
BP Readings from Last 3 Encounters:  03/16/17 110/80  11/22/16 124/68  08/23/16 (!) 108/58   HCTZ

## 2017-03-16 NOTE — Assessment & Plan Note (Signed)
2018 Sweats started w/her new HRT. She is seeing a doctor w/ "[facility]" for wt loss and HRT - she is on testosterone pellets implants; B12 shots weekly. Lost 20 lbs

## 2017-03-16 NOTE — Progress Notes (Signed)
Subjective:  Patient ID: Julia Zimmerman, female    DOB: 01/01/1951  Age: 66 y.o. MRN: [id number]  CC: No chief complaint on file.   HPI Julia Zimmerman presents for anxiety, OA, HTN, HAs  She is seeing a doctor w/ "[facility]" for wt loss and HRT - she is on testosterone pellets implants; B12 shots weekly. Lost 20 lbs on Phentermine prn  Outpatient Medications Prior to Visit  Medication Sig Dispense Refill  . alprazolam (XANAX) 2 MG tablet TAKE 1 TABLET BY MOUTH THREE TIMES A DAY AS NEEDED FOR ANXIETY 90 tablet 1  . butalbital-acetaminophen-caffeine (FIORICET, ESGIC) 50-325-40 MG tablet TAKE 1 TABLET EVERY 6 HOURSAS NEEDED FOR HEADACHE 90 tablet 1  . cholecalciferol (VITAMIN D) 1000 units tablet Take 1,000 Units by mouth daily.    . Cyanocobalamin (VITAMIN B-12) 500 MCG SUBL 1 sl qd (Patient taking differently: Place 500 mcg under the tongue daily. ) 150 tablet   . hydrochlorothiazide (MICROZIDE) 12.5 MG capsule Take 1 capsule (12.5 mg total) by mouth daily. 90 capsule 0  . HYDROcodone-acetaminophen (NORCO/VICODIN) 5-325 MG tablet Take 1 tablet by mouth every 4 (four) hours as needed for moderate pain or severe pain. 20 tablet 0  . losartan (COZAAR) 50 MG tablet Take 50 mg by mouth daily.    [name] omega-3 acid ethyl esters (LOVAZA) 1 g capsule Take 1 g by mouth daily.    [name] omeprazole (PRILOSEC) 20 MG capsule Take 1 capsule (20 mg total) by mouth daily. 30 capsule 11  . venlafaxine XR (EFFEXOR-XR) 150 MG 24 hr capsule Take 1 capsule (150 mg total) by mouth daily with breakfast. 1 cap bid for depression (Patient taking differently: Take 150 mg by mouth every other day. ) 90 capsule 3   Facility-Administered Medications Prior to Visit  Medication Dose Route Frequency Provider Last Rate Last Dose  . regadenoson (LEXISCAN) injection SOLN 0.4 mg  0.4 mg Intravenous Once Dorothy Spark, MD        ROS Review of Systems  Constitutional: Negative for activity change, appetite change,  chills, fatigue and unexpected weight change.  HENT: Negative for congestion, mouth sores and sinus pressure.   Eyes: Negative for visual disturbance.  Respiratory: Negative for cough and chest tightness.   Gastrointestinal: Negative for abdominal pain and nausea.  Genitourinary: Negative for difficulty urinating, frequency and vaginal pain.  Musculoskeletal: Positive for arthralgias. Negative for back pain and gait problem.  Skin: Negative for pallor and rash.  Neurological: Negative for dizziness, tremors, weakness, numbness and headaches.  Psychiatric/Behavioral: Negative for confusion and sleep disturbance. The patient is nervous/anxious.     Objective:  BP 110/80 (BP Location: Left Arm, Patient Position: Sitting, Cuff Size: Normal)   Pulse 98   Temp 98.1 F (36.7 C) (Oral)   Ht 5\' 2"  (1.575 m)   Wt 158 lb 4 oz (71.8 kg)   SpO2 96%   BMI 28.94 kg/m   BP Readings from Last 3 Encounters:  03/16/17 110/80  11/22/16 124/68  08/23/16 (!) 108/58    Wt Readings from Last 3 Encounters:  03/16/17 158 lb 4 oz (71.8 kg)  11/20/16 160 lb (72.6 kg)  08/23/16 177 lb 12.8 oz (80.6 kg)    Physical Exam  Constitutional: She appears well-developed. No distress.  HENT:  Head: Normocephalic.  Right Ear: External ear normal.  Left Ear: External ear normal.  Nose: Nose normal.  Mouth/Throat: Oropharynx is clear and moist.  Eyes: Conjunctivae are normal. Pupils are  equal, round, and reactive to light. Right eye exhibits no discharge. Left eye exhibits no discharge.  Neck: Normal range of motion. Neck supple. No JVD present. No tracheal deviation present. No thyromegaly present.  Cardiovascular: Normal rate, regular rhythm and normal heart sounds.  Pulmonary/Chest: No stridor. No respiratory distress. She has no wheezes.  Abdominal: Soft. Bowel sounds are normal. She exhibits no distension and no mass. There is no tenderness. There is no rebound and no guarding.  Musculoskeletal: She  exhibits no edema or tenderness.  Lymphadenopathy:    She has no cervical adenopathy.  Neurological: She displays normal reflexes. No cranial nerve deficit. She exhibits normal muscle tone. Coordination normal.  Skin: No rash noted. No erythema.  Psychiatric: She has a normal mood and affect. Her behavior is normal. Judgment and thought content normal.    Lab Results  Component Value Date   WBC 11.3 (H) 11/20/2016   HGB 14.3 11/20/2016   HCT 42.4 11/20/2016   PLT 278 11/20/2016   GLUCOSE 121 (H) 11/20/2016   CHOL 258 (H) 03/12/2016   TRIG 323.0 (H) 03/12/2016   HDL 48.40 03/12/2016   LDLDIRECT 128.0 03/12/2016   LDLCALC 71 11/22/2012   ALT 22 11/20/2016   AST 24 11/20/2016   NA 139 11/20/2016   K 3.5 11/20/2016   CL 101 11/20/2016   CREATININE 0.63 11/20/2016   BUN 6 11/20/2016   CO2 28 11/20/2016   TSH 2.18 03/12/2016    Dg Chest 2 View  Result Date: 11/19/2016 CLINICAL DATA:  Mid chest pain since last evening. EXAM: CHEST  2 VIEW COMPARISON:  06/19/2015; 07/28/2006; 03/18/2009 FINDINGS: Grossly unchanged cardiac silhouette and mediastinal contours. Bilateral infrahilar opacities are unchanged and favored to represent atelectasis or scar. No new focal airspace opacities. No pleural effusion or pneumothorax. No evidence of edema. No acute osseous abnormalities. IMPRESSION: No acute cardiopulmonary disease. Electronically Signed   By: Sandi Mariscal M.D.   On: 11/19/2016 10:16   Dg Cholangiogram Operative  Result Date: 11/20/2016 CLINICAL DATA:  66 year old female with cholecystitis EXAM: INTRAOPERATIVE CHOLANGIOGRAM TECHNIQUE: Cholangiographic images from the C-arm fluoroscopic device were submitted for interpretation post-operatively. Please see the procedural report for the amount of contrast and the fluoroscopy time utilized. COMPARISON:  Abdominal ultrasound 11/19/2016 FINDINGS: Cine clip obtained during intraoperative cholangiogram at the time the laparoscopic cholecystectomy.  The images demonstrate cannulation of the cystic duct remnant and opacification of the biliary tree. No evidence of biliary ductal dilatation, stenosis, stricture or choledocholithiasis. Contrast material passes freely through the ampulla and into the duodenum. IMPRESSION: Negative intraoperative cholangiogram. Electronically Signed   By: Jacqulynn Cadet M.D.   On: 11/20/2016 09:27   US Abdomen Limited  Result Date: 11/19/2016 CLINICAL DATA:  66 year old female with right upper quadrant abdominal pain since 0100 hours. EXAM: ULTRASOUND ABDOMEN LIMITED RIGHT UPPER QUADRANT COMPARISON:  Chest CT 03/18/2009. FINDINGS: Gallbladder: Shadowing 28 mm gallstone within the neck of the gallbladder (image 8). The stone seems to be nonmobile on these images. Gallbladder wall thickness remains normal, and no pericholecystic fluid is identified. The patient had received pain medication prior to this exam, and no sonographic Murphy sign was elicited. Common Bile Duct: 7 mm, upper limits of normal to mildly dilated, although a 6-7 mm CBD is demonstrated on the 2010 chest CT. Liver: No intrahepatic biliary ductal dilatation. No discrete liver lesion. Hepatic echogenicity within normal limits. Other findings: Negative visible right kidney. IMPRESSION: 1. Gallstone measuring 2.8 cm which might be lodged in the neck  of the gallbladder. Equivocal sonographic Percell Miller sign due to prior pain medication, but no other ultrasound features of acute cholecystitis. 2. CBD at the upper limits of normal. No intrahepatic biliary ductal dilatation to strongly suggest acute biliary obstruction. Electronically Signed   By: Genevie Ann M.D.   On: 11/19/2016 12:35    Assessment & Plan:   There are no diagnoses linked to this encounter. I am having Foy [place]. Velasquez maintain her Vitamin B-12, venlafaxine XR, omeprazole, losartan, cholecalciferol, omega-3 acid ethyl esters, HYDROcodone-acetaminophen, butalbital-acetaminophen-caffeine,  hydrochlorothiazide, and alprazolam.  No orders of the defined types were placed in this encounter.    Follow-up: No Follow-up on file.  Walker Kehr, MD

## 2017-03-16 NOTE — Assessment & Plan Note (Signed)
Xanax prn 

## 2017-03-16 NOTE — Assessment & Plan Note (Signed)
Effexor, Xanax  Potential benefits of a long term benzodiazepines  use as well as potential risks  and complications were explained to the patient and were aknowledged. Pt is seeing a doctor w/ [facility]" for wt loss and HRT - she is on testosterone pellets implants; B12 shots weekly. Lost 20 lbs

## 2017-03-18 ENCOUNTER — Other Ambulatory Visit (INDEPENDENT_AMBULATORY_CARE_PROVIDER_SITE_OTHER): Payer: BLUE CROSS/BLUE SHIELD

## 2017-03-18 DIAGNOSIS — F5101 Primary insomnia: Secondary | ICD-10-CM | POA: Diagnosis not present

## 2017-03-18 DIAGNOSIS — R232 Flushing: Secondary | ICD-10-CM | POA: Diagnosis not present

## 2017-03-18 DIAGNOSIS — I10 Essential (primary) hypertension: Secondary | ICD-10-CM | POA: Diagnosis not present

## 2017-03-18 DIAGNOSIS — F341 Dysthymic disorder: Secondary | ICD-10-CM | POA: Diagnosis not present

## 2017-03-18 LAB — CBC WITH DIFFERENTIAL/PLATELET
BASOS PCT: 1.3 % (ref 0.0–3.0)
Basophils Absolute: 0.1 10*3/uL (ref 0.0–0.1)
EOS PCT: 6.7 % — AB (ref 0.0–5.0)
Eosinophils Absolute: 0.4 10*3/uL (ref 0.0–0.7)
HEMATOCRIT: 43.3 % (ref 36.0–46.0)
HEMOGLOBIN: 14.3 g/dL (ref 12.0–15.0)
LYMPHS PCT: 30.2 % (ref 12.0–46.0)
Lymphs Abs: 1.6 10*3/uL (ref 0.7–4.0)
MCHC: 33.1 g/dL (ref 30.0–36.0)
MCV: 88.9 fl (ref 78.0–100.0)
MONO ABS: 0.6 10*3/uL (ref 0.1–1.0)
MONOS PCT: 10.3 % (ref 3.0–12.0)
Neutro Abs: 2.8 10*3/uL (ref 1.4–7.7)
Neutrophils Relative %: 51.5 % (ref 43.0–77.0)
Platelets: 308 10*3/uL (ref 150.0–400.0)
RBC: 4.88 Mil/uL (ref 3.87–5.11)
RDW: 14.3 % (ref 11.5–15.5)
WBC: 5.4 10*3/uL (ref 4.0–10.5)

## 2017-03-18 LAB — BASIC METABOLIC PANEL
BUN: 11 mg/dL (ref 6–23)
CHLORIDE: 103 meq/L (ref 96–112)
CO2: 28 mEq/L (ref 19–32)
Calcium: 9.2 mg/dL (ref 8.4–10.5)
Creatinine, Ser: 0.69 mg/dL (ref 0.40–1.20)
GFR: 90.85 mL/min (ref >60.00)
Glucose, Bld: 96 mg/dL (ref 70–99)
POTASSIUM: 4 meq/L (ref 3.5–5.1)
Sodium: 139 mEq/L (ref 135–145)

## 2017-03-18 LAB — LIPID PANEL
CHOL/HDL RATIO: 4
CHOLESTEROL: 200 mg/dL (ref 0–200)
HDL: 47.7 mg/dL (ref >39.00)
NonHDL: 151.87
Triglycerides: 210 mg/dL — ABNORMAL HIGH (ref 0.0–149.0)
VLDL: 42 mg/dL — AB (ref 0.0–40.0)

## 2017-03-18 LAB — URINALYSIS, ROUTINE W REFLEX MICROSCOPIC
Bilirubin Urine: NEGATIVE
Ketones, ur: NEGATIVE
Nitrite: NEGATIVE
SPECIFIC GRAVITY, URINE: 1.01 (ref 1.000–1.030)
Total Protein, Urine: NEGATIVE
Urine Glucose: NEGATIVE
Urobilinogen, UA: 0.2 (ref 0.0–1.0)
pH: 6.5 (ref 5.0–8.0)

## 2017-03-18 LAB — HEPATIC FUNCTION PANEL
ALT: 26 U/L (ref 0–35)
AST: 27 U/L (ref 0–37)
Albumin: 3.9 g/dL (ref 3.5–5.2)
Alkaline Phosphatase: 102 U/L (ref 39–117)
BILIRUBIN DIRECT: 0 mg/dL (ref 0.0–0.3)
TOTAL PROTEIN: 6.9 g/dL (ref 6.0–8.3)
Total Bilirubin: 0.4 mg/dL (ref 0.2–1.2)

## 2017-03-18 LAB — VITAMIN D 25 HYDROXY (VIT D DEFICIENCY, FRACTURES): VITD: 21.67 ng/mL — ABNORMAL LOW (ref 30.00–100.00)

## 2017-03-18 LAB — LDL CHOLESTEROL, DIRECT: LDL DIRECT: 98 mg/dL

## 2017-03-18 LAB — TSH: TSH: 2.17 u[IU]/mL (ref 0.35–4.50)

## 2017-03-18 LAB — VITAMIN B12: Vitamin B-12: 742 pg/mL (ref 211–911)

## 2017-03-20 ENCOUNTER — Other Ambulatory Visit: Payer: Self-pay | Admitting: Internal Medicine

## 2017-03-20 MED ORDER — VITAMIN D3 1.25 MG (50000 UT) PO CAPS: 1.0000 | ORAL_CAPSULE | ORAL | 0 refills | Status: DC

## 2017-03-20 MED ORDER — VITAMIN D3 50 MCG (2000 UT) PO CAPS: 2000.0000 [IU] | ORAL_CAPSULE | Freq: Every day | ORAL | 3 refills | Status: DC

## 2017-03-22 ENCOUNTER — Telehealth: Payer: Self-pay | Admitting: Internal Medicine

## 2017-03-22 ENCOUNTER — Other Ambulatory Visit: Payer: Self-pay | Admitting: Internal Medicine

## 2017-03-22 NOTE — Telephone Encounter (Signed)
[facility] called regarding the venlafaxine XR (EFFEXOR-XR) 150 MG prescription. The directions were sent as Take 1 capsule (150 mg total) daily with breakfast by mouth. 1 cap bid for depression so they would like to know if she should be doing one a day or 1 BID. Please advise.  Ref# [id number]

## 2017-03-23 NOTE — Telephone Encounter (Signed)
LM for pt to call back.

## 2017-03-29 NOTE — Telephone Encounter (Signed)
Pt returned call she takes medication 1 per day.  Pt cb number is [phone]

## 2017-03-30 MED ORDER — VENLAFAXINE HCL ER 150 MG PO CP24: 150.0000 mg | ORAL_CAPSULE | Freq: Every day | ORAL | 3 refills | Status: DC

## 2017-03-30 NOTE — Telephone Encounter (Signed)
RX sent with correct SIG

## 2017-05-14 ENCOUNTER — Other Ambulatory Visit: Payer: Self-pay | Admitting: Internal Medicine

## 2017-05-19 ENCOUNTER — Telehealth: Payer: Self-pay | Admitting: Internal Medicine

## 2017-05-19 MED ORDER — FLUCONAZOLE 150 MG PO TABS: 150.0000 mg | ORAL_TABLET | Freq: Once | ORAL | 1 refills | Status: AC

## 2017-05-19 MED ORDER — CIPROFLOXACIN HCL 250 MG PO TABS: 250.0000 mg | ORAL_TABLET | Freq: Two times a day (BID) | ORAL | 0 refills | Status: DC

## 2017-05-19 NOTE — Telephone Encounter (Signed)
Please advise 

## 2017-05-19 NOTE — Telephone Encounter (Signed)
I will email a prescription for antibiotic and Diflucan as an exception.  Normal will require office visit  thank you

## 2017-05-19 NOTE — Telephone Encounter (Signed)
Copied from [place]. Topic: Quick Communication - Rx Refill/Question >> May 19, 2017  7:53 AM Julia Zimmerman wrote: Medication: abx for poss UTI Pt states she has frequency, urgency, burning while urinating, painful.  Pt states she could not access the e-visit information, not available on the site. Pt is hoping Dr Julia Zimmerman will call in an antibiotic, along with fluconazole (DIFLUCAN) 150 MG tablet. (pt states this abx always causes her to get yeast infection)  Pt declined an office visit, due to work. Pt states dr knows her very well.   [id number] [place] [place] [address] [place] [phone] (Phone) [phone] (Fax)

## 2017-05-20 NOTE — Telephone Encounter (Signed)
LM notifying pt

## 2017-06-10 ENCOUNTER — Other Ambulatory Visit: Payer: Self-pay | Admitting: Internal Medicine

## 2017-06-13 NOTE — Telephone Encounter (Signed)
Check [place] registry last filled 04/20/2017.[name]/LMB

## 2017-06-29 DIAGNOSIS — N951 Menopausal and female climacteric states: Secondary | ICD-10-CM | POA: Diagnosis not present

## 2017-06-29 DIAGNOSIS — L680 Hirsutism: Secondary | ICD-10-CM | POA: Diagnosis not present

## 2017-07-01 ENCOUNTER — Other Ambulatory Visit: Payer: Self-pay | Admitting: Internal Medicine

## 2017-07-08 DIAGNOSIS — N898 Other specified noninflammatory disorders of vagina: Secondary | ICD-10-CM | POA: Diagnosis not present

## 2017-07-15 DIAGNOSIS — N951 Menopausal and female climacteric states: Secondary | ICD-10-CM | POA: Diagnosis not present

## 2017-07-15 DIAGNOSIS — G4700 Insomnia, unspecified: Secondary | ICD-10-CM | POA: Diagnosis not present

## 2017-07-15 DIAGNOSIS — L680 Hirsutism: Secondary | ICD-10-CM | POA: Diagnosis not present

## 2017-07-15 DIAGNOSIS — M2550 Pain in unspecified joint: Secondary | ICD-10-CM | POA: Diagnosis not present

## 2017-08-19 ENCOUNTER — Other Ambulatory Visit: Payer: Self-pay | Admitting: Internal Medicine

## 2017-09-13 ENCOUNTER — Other Ambulatory Visit: Payer: Self-pay | Admitting: Internal Medicine

## 2017-10-06 ENCOUNTER — Telehealth: Payer: Self-pay | Admitting: Internal Medicine

## 2017-10-14 NOTE — Telephone Encounter (Signed)
Patient says the pharmacy says they cant process the script because there isn't a quantity on it. It should be a 90 day supply.

## 2017-10-18 NOTE — Telephone Encounter (Signed)
RX shipped to pt already.  Are the 90 tablets supposed to be a 30 or 90 day supply?

## 2017-10-19 NOTE — Telephone Encounter (Signed)
90 d Thx

## 2017-12-26 ENCOUNTER — Other Ambulatory Visit: Payer: Self-pay

## 2017-12-26 ENCOUNTER — Telehealth: Payer: Self-pay | Admitting: Internal Medicine

## 2017-12-26 MED ORDER — HYDROCHLOROTHIAZIDE 12.5 MG PO CAPS: 12.5000 mg | ORAL_CAPSULE | Freq: Every day | ORAL | 0 refills | Status: DC

## 2017-12-26 NOTE — Telephone Encounter (Signed)
Fioricet refill Last Refill:10/06/17 # 90 with 1 refill Last OV: 03/16/17 PCP: Dr. Alain Marion Pharmacy:[facility]

## 2017-12-26 NOTE — Telephone Encounter (Signed)
Copied from [place] [id number]. Topic: Quick Communication - Rx Refill/Question >> Dec 26, 2017 11:04 AM Waylan Rocher, Lumin L wrote: Medication: hydrochlorothiazide (MICROZIDE) 12.5 MG capsule, butalbital-acetaminophen-caffeine (FIORICET, ESGIC) 50-325-40 MG tablet   Has the patient contacted their pharmacy? Yes.   (Agent: If no, request that the patient contact the pharmacy for the refill.) (Agent: If yes, when and what did the pharmacy advise?)  Preferred Pharmacy (with phone number or street name): [facility] [place] [place] to [address] [place] [address] Phone: [phone] Fax: [phone]    Agent: Please be advised that RX refills may take up to 3 business days. We ask that you follow-up with your pharmacy.

## 2017-12-27 ENCOUNTER — Other Ambulatory Visit: Payer: Self-pay

## 2017-12-27 MED ORDER — BUTALBITAL-APAP-CAFFEINE 50-325-40 MG PO TABS: ORAL_TABLET | ORAL | 1 refills | Status: DC

## 2017-12-27 MED ORDER — VENLAFAXINE HCL ER 150 MG PO CP24: 150.0000 mg | ORAL_CAPSULE | Freq: Every day | ORAL | 3 refills | Status: DC

## 2017-12-27 NOTE — Telephone Encounter (Signed)
MD approved and sent electronically to pof../lmb  

## 2017-12-27 NOTE — Telephone Encounter (Signed)
Ok thx.

## 2018-01-11 DIAGNOSIS — M170 Bilateral primary osteoarthritis of knee: Secondary | ICD-10-CM | POA: Diagnosis not present

## 2018-01-16 ENCOUNTER — Telehealth: Payer: Self-pay | Admitting: Internal Medicine

## 2018-01-18 ENCOUNTER — Other Ambulatory Visit: Payer: Self-pay | Admitting: Internal Medicine

## 2018-01-18 DIAGNOSIS — M170 Bilateral primary osteoarthritis of knee: Secondary | ICD-10-CM | POA: Diagnosis not present

## 2018-01-20 NOTE — Telephone Encounter (Signed)
Pt following up on this refill request alprazolam Duanne Moron) 2 MG tablet 90 day Pt would like you to send to  [place], [place] to [address] [facility] [phone] (Phone) [phone] (Fax)    *This is a pharmacy change!

## 2018-01-24 MED ORDER — ALPRAZOLAM 2 MG PO TABS: ORAL_TABLET | ORAL | 0 refills | Status: DC

## 2018-01-24 NOTE — Addendum Note (Signed)
Addended by: Karren Cobble on: 01/24/2018 02:32 PM   Modules accepted: Orders

## 2018-01-24 NOTE — Telephone Encounter (Signed)
LMTCB, pt is supposed to be seen every 6 months with this being a controlled medication. Dr. Alain Marion denied the RX, pt will need to be seen to get RX

## 2018-01-24 NOTE — Telephone Encounter (Signed)
RX faxed

## 2018-01-24 NOTE — Telephone Encounter (Signed)
Pt calling regarding denial on alprazolam - advised pt to schedule appt - she states she has no reason to see Dr. Alain Marion more than once a year - she is requesting 90 day supply of Alprazolam to go to [place] - she states if she can't get it she won't be able to sleep for the next 2 months.  [facility] [place] [place] [facility] [address] [employer] [phone] (Phone) [phone] (Fax)

## 2018-01-25 DIAGNOSIS — M170 Bilateral primary osteoarthritis of knee: Secondary | ICD-10-CM | POA: Diagnosis not present

## 2018-02-13 DIAGNOSIS — M238X1 Other internal derangements of right knee: Secondary | ICD-10-CM | POA: Diagnosis not present

## 2018-02-14 ENCOUNTER — Other Ambulatory Visit: Payer: Self-pay | Admitting: Internal Medicine

## 2018-02-15 NOTE — Telephone Encounter (Signed)
Please advise about refill in Dr. Plotnikovs absence. 

## 2018-02-15 NOTE — Telephone Encounter (Signed)
Done erx  Pt due to ROV with Dr Alain Marion for further refills

## 2018-03-07 DIAGNOSIS — M25561 Pain in right knee: Secondary | ICD-10-CM | POA: Diagnosis not present

## 2018-03-13 DIAGNOSIS — M1711 Unilateral primary osteoarthritis, right knee: Secondary | ICD-10-CM | POA: Diagnosis not present

## 2018-03-17 ENCOUNTER — Other Ambulatory Visit: Payer: Self-pay | Admitting: Internal Medicine

## 2018-03-17 NOTE — Telephone Encounter (Signed)
Copied from [place] [id number]. Topic: Quick Communication - Rx Refill/Question >> Mar 17, 2018 12:21 PM Mcneil, Ja-Kwan wrote: Medication: butalbital-acetaminophen-caffeine (FIORICET, ESGIC) 50-325-40 MG tablet  and  hydrochlorothiazide (MICROZIDE) 12.5 MG capsule  Has the patient contacted their pharmacy? yes   Preferred Pharmacy (with phone number or street name): [facility] [place], [place] to [address] [facility] [phone] (Phone) [phone] (Fax)  Agent: Please be advised that RX refills may take up to 3 business days. We ask that you follow-up with your pharmacy.

## 2018-03-17 NOTE — Telephone Encounter (Signed)
Requested medication (s) are due for refill today: yes for Fioricet, Esgic 50-325 mg and no for Microzide 12.5 mg   Requested medication (s) are on the active medication list: yes  Last refill:  Fioricet , Esgic 50-325 mg LR 02/15/18 and Microzide 12.5 mg LR 01/18/18  Future visit scheduled: yes  03/29/18  Notes to clinic:  Fioricet Esgic not delegated and Microzide (cardiovascular: Diuretics - Thiazide failed), labs are greater than 360 days.  Requested Prescriptions  Pending Prescriptions Disp Refills   butalbital-acetaminophen-caffeine (FIORICET, ESGIC) 50-325-40 MG tablet 90 tablet 0    Sig: TAKE 1 TABLET EVERY 6 HOURSAS NEEDED FOR HEADACHE     Not Delegated - Analgesics:  Non-Opioid Analgesic Combinations Failed - 03/17/2018  2:55 PM      Failed - This refill cannot be delegated      Failed - Valid encounter within last 12 months    Recent Outpatient Visits          1 year ago Need for influenza vaccination   [place] Plotnikov, Evie Lacks, MD   1 year ago [facility] Plot[facility], Evie Lacks, MD   2 years ago Well adult exam   [place] Plotnikov, Evie Lacks, MD   2 years ago Left upper arm pain   [place], Evie Lacks, MD   2 years ago Complicated grief   [place], [place], RN      Future Appointments            In 1 week Plotnikov, Evie Lacks, MD [facility]          hydrochlorothiazide (MICROZIDE) 12.5 MG capsule 90 capsule 0    Sig: Take 1 capsule (12.5 mg total) by mouth daily. Patient needs office visit before refills will be given     Cardiovascular: Diuretics - Thiazide Failed - 03/17/2018  2:55 PM      Failed - Ca in normal range and within 360 days    Calcium  Date Value Ref Range Status  03/18/2017 9.2 8.4 - 10.5 mg/dL Final         Failed - Cr in normal  range and within 360 days    Creatinine, Ser  Date Value Ref Range Status  03/18/2017 0.69 0.40 - 1.20 mg/dL Final         Failed - K in normal range and within 360 days    Potassium  Date Value Ref Range Status  03/18/2017 4.0 3.5 - 5.1 mEq/L Final         Failed - Na in normal range and within 360 days    Sodium  Date Value Ref Range Status  03/18/2017 139 135 - 145 mEq/L Final  06/09/2016 139 134 - 144 mmol/L Final         Failed - Valid encounter within last 6 months    Recent Outpatient Visits          1 year ago Need for influenza vaccination   [place] Plotnikov, Evie Lacks, MD   1 year ago [facility] Plotnikov, Evie Lacks, MD   2 years ago Well adult exam   [place] Plotnikov, Evie Lacks, MD   2 years ago Left upper arm pain   [facility] Plotnikov, Evie Lacks, MD  2 years ago Complicated grief   [place], RN      Future Appointments            In 1 week Plotnikov, Evie Lacks, MD [place] BP in normal range    BP Readings from Last 1 Encounters:  03/16/17 110/80

## 2018-03-20 MED ORDER — HYDROCHLOROTHIAZIDE 12.5 MG PO CAPS: 12.5000 mg | ORAL_CAPSULE | Freq: Every day | ORAL | 3 refills | Status: DC

## 2018-03-20 MED ORDER — BUTALBITAL-APAP-CAFFEINE 50-325-40 MG PO TABS: ORAL_TABLET | ORAL | 0 refills | Status: DC

## 2018-03-23 DIAGNOSIS — M1711 Unilateral primary osteoarthritis, right knee: Secondary | ICD-10-CM | POA: Diagnosis not present

## 2018-03-24 ENCOUNTER — Ambulatory Visit: Payer: Self-pay | Admitting: *Deleted

## 2018-03-24 NOTE — Telephone Encounter (Signed)
Spoke with Nyla at [facility] regarding status of refill of butalbital- acetaminophen-caffeine (FIORICET, ESGIC) 50-325-40 MG tablet; she states that prior authorization is needed; please call  [phone] or [phone] (fax);  notified Gareth Eagle at [facility]; she requests that this routed to the office for final disposition; will route to office; please contact pt with final disposition; she can be contacted at [phone].

## 2018-03-24 NOTE — Telephone Encounter (Signed)
Prior Auth needed for butalbital- acetaminophen-caffeine (FIORICET, ESGIC) 50-325-40 MG tablet.

## 2018-03-24 NOTE — Telephone Encounter (Signed)
  Reason for Disposition . Pharmacy calling with prescription question and triager answers question  Answer Assessment - Initial Assessment Questions 1. SYMPTOMS: "Do you have any symptoms?"     n/a 2. SEVERITY: If symptoms are present, ask "Are they mild, moderate or severe?"     n/a  Protocols used: MEDICATION QUESTION CALL-A-AH

## 2018-03-29 ENCOUNTER — Other Ambulatory Visit (INDEPENDENT_AMBULATORY_CARE_PROVIDER_SITE_OTHER): Payer: BLUE CROSS/BLUE SHIELD

## 2018-03-29 ENCOUNTER — Encounter: Payer: Self-pay | Admitting: Internal Medicine

## 2018-03-29 ENCOUNTER — Ambulatory Visit (INDEPENDENT_AMBULATORY_CARE_PROVIDER_SITE_OTHER): Payer: BLUE CROSS/BLUE SHIELD | Admitting: Internal Medicine

## 2018-03-29 VITALS — BP 130/80 | HR 90 | Temp 97.9°F | Ht 62.0 in | Wt 164.0 lb

## 2018-03-29 DIAGNOSIS — F341 Dysthymic disorder: Secondary | ICD-10-CM

## 2018-03-29 DIAGNOSIS — Z0000 Encounter for general adult medical examination without abnormal findings: Secondary | ICD-10-CM | POA: Diagnosis not present

## 2018-03-29 DIAGNOSIS — Z01818 Encounter for other preprocedural examination: Secondary | ICD-10-CM | POA: Diagnosis not present

## 2018-03-29 DIAGNOSIS — F5101 Primary insomnia: Secondary | ICD-10-CM

## 2018-03-29 DIAGNOSIS — M25561 Pain in right knee: Secondary | ICD-10-CM

## 2018-03-29 DIAGNOSIS — G8929 Other chronic pain: Secondary | ICD-10-CM | POA: Diagnosis not present

## 2018-03-29 DIAGNOSIS — E785 Hyperlipidemia, unspecified: Secondary | ICD-10-CM

## 2018-03-29 DIAGNOSIS — I10 Essential (primary) hypertension: Secondary | ICD-10-CM | POA: Diagnosis not present

## 2018-03-29 DIAGNOSIS — M25562 Pain in left knee: Secondary | ICD-10-CM

## 2018-03-29 DIAGNOSIS — M25569 Pain in unspecified knee: Secondary | ICD-10-CM

## 2018-03-29 DIAGNOSIS — F988 Other specified behavioral and emotional disorders with onset usually occurring in childhood and adolescence: Secondary | ICD-10-CM

## 2018-03-29 LAB — BASIC METABOLIC PANEL
BUN: 14 mg/dL (ref 6–23)
CO2: 28 mEq/L (ref 19–32)
Calcium: 9.5 mg/dL (ref 8.4–10.5)
Chloride: 101 mEq/L (ref 96–112)
Creatinine, Ser: 0.66 mg/dL (ref 0.40–1.20)
GFR: 95.32 mL/min (ref >60.00)
GLUCOSE: 99 mg/dL (ref 70–99)
POTASSIUM: 3.6 meq/L (ref 3.5–5.1)
SODIUM: 137 meq/L (ref 135–145)

## 2018-03-29 LAB — HEPATIC FUNCTION PANEL
ALBUMIN: 4.2 g/dL (ref 3.5–5.2)
ALT: 21 U/L (ref 0–35)
AST: 19 U/L (ref 0–37)
Alkaline Phosphatase: 98 U/L (ref 39–117)
BILIRUBIN TOTAL: 0.4 mg/dL (ref 0.2–1.2)
Bilirubin, Direct: 0 mg/dL (ref 0.0–0.3)
TOTAL PROTEIN: 7.5 g/dL (ref 6.0–8.3)

## 2018-03-29 LAB — PROTIME-INR
INR: 0.9 ratio (ref 0.8–1.0)
Prothrombin Time: 10.7 s (ref 9.6–13.1)

## 2018-03-29 LAB — URINALYSIS
Bilirubin Urine: NEGATIVE
Hgb urine dipstick: NEGATIVE
KETONES UR: NEGATIVE
Leukocytes, UA: NEGATIVE
Nitrite: NEGATIVE
Specific Gravity, Urine: 1.025 (ref 1.000–1.030)
TOTAL PROTEIN, URINE-UPE24: NEGATIVE
URINE GLUCOSE: NEGATIVE
Urobilinogen, UA: 0.2 (ref 0.0–1.0)
pH: 5.5 (ref 5.0–8.0)

## 2018-03-29 LAB — CBC WITH DIFFERENTIAL/PLATELET
BASOS PCT: 1 % (ref 0.0–3.0)
Basophils Absolute: 0.1 10*3/uL (ref 0.0–0.1)
EOS PCT: 1.9 % (ref 0.0–5.0)
Eosinophils Absolute: 0.1 10*3/uL (ref 0.0–0.7)
HCT: 43.2 % (ref 36.0–46.0)
Hemoglobin: 14.7 g/dL (ref 12.0–15.0)
LYMPHS ABS: 1.6 10*3/uL (ref 0.7–4.0)
Lymphocytes Relative: 23.8 % (ref 12.0–46.0)
MCHC: 34 g/dL (ref 30.0–36.0)
MCV: 88.1 fl (ref 78.0–100.0)
MONO ABS: 0.8 10*3/uL (ref 0.1–1.0)
Monocytes Relative: 11.6 % (ref 3.0–12.0)
NEUTROS ABS: 4 10*3/uL (ref 1.4–7.7)
Neutrophils Relative %: 61.7 % (ref 43.0–77.0)
PLATELETS: 283 10*3/uL (ref 150.0–400.0)
RBC: 4.9 Mil/uL (ref 3.87–5.11)
RDW: 12.7 % (ref 11.5–15.5)
WBC: 6.5 10*3/uL (ref 4.0–10.5)

## 2018-03-29 LAB — LIPID PANEL
CHOLESTEROL: 304 mg/dL — AB (ref 0–200)
HDL: 45.7 mg/dL (ref >39.00)
NonHDL: 258.44
TRIGLYCERIDES: 238 mg/dL — AB (ref 0.0–149.0)
Total CHOL/HDL Ratio: 7
VLDL: 47.6 mg/dL — AB (ref 0.0–40.0)

## 2018-03-29 LAB — APTT: aPTT: 29 s (ref 23.4–32.7)

## 2018-03-29 LAB — TSH: TSH: 2.14 u[IU]/mL (ref 0.35–4.50)

## 2018-03-29 LAB — LDL CHOLESTEROL, DIRECT: LDL DIRECT: 172 mg/dL

## 2018-03-29 MED ORDER — ALPRAZOLAM 2 MG PO TABS: ORAL_TABLET | ORAL | 1 refills | Status: DC

## 2018-03-29 MED ORDER — BUTALBITAL-APAP-CAFFEINE 50-325-40 MG PO TABS: ORAL_TABLET | ORAL | 1 refills | Status: DC

## 2018-03-29 MED ORDER — AMPHETAMINE-DEXTROAMPHET ER 5 MG PO CP24: 5.0000 mg | ORAL_CAPSULE | Freq: Every day | ORAL | 0 refills | Status: DC

## 2018-03-29 NOTE — Assessment & Plan Note (Signed)
We discussed age appropriate health related issues, including available/recomended screening tests and vaccinations. We discussed a need for adhering to healthy diet and exercise. Labs were ordered to be later reviewed . All questions were answered. GYN/mammo q 12 mo CT ca scoring info given Colonoscopy Dec '10

## 2018-03-29 NOTE — Assessment & Plan Note (Signed)
HCTZ 

## 2018-03-29 NOTE — Progress Notes (Addendum)
Subjective:  Patient ID: Julia Zimmerman, female    DOB: 1950-07-16  Age: 67 y.o. MRN: [id number]  CC: No chief complaint on file.   HPI Julia Zimmerman presents for a pre-op exam Req by Dr Noemi Chapel Reason: Planning R TKR by Dr Noemi Chapel on Dec 20th 2019 Hx: F/u on insomnia, HAs, HTN - stable. C/o ADD - took Adderall XR x many years, it helped   Past Medical History:  Diagnosis Date  . ADD 10/30/2008   Qualifier: Diagnosis of  By: Niel Hummer MD, Lorinda Creed Anxiety   . ANXIETY DEPRESSION 03/29/2007   Qualifier: Diagnosis of  By: Niel Hummer MD, Lorinda Creed   . Cough 04/21/2010   full eval including allergist. Working diagnosis - reflux.  . Depression   . Endometriosis   . Episodic tension type headache 11/07/2007   Qualifier: Diagnosis of  By: Niel Hummer MD, Lorinda Creed   . Essential hypertension, benign 05/15/2010   Qualifier: Diagnosis of  By: Elizebeth Koller MD, Mora Appl   . GERD 04/21/2010   Qualifier: Diagnosis of  By: Niel Hummer MD, Lorinda Creed   . GERD (gastroesophageal reflux disease)   . Hyperlipidemia   . HYPERLIPIDEMIA 05/15/2010   Qualifier: Diagnosis of  By: Elizebeth Koller MD, Mora Appl   . Learning disability    poor attention span and difficulty with retention  . ONYCHOMYCOSIS 10/30/2008   successfully treated with lamisil.  [name] RHINITIS 04/21/2010   Qualifier: Diagnosis of  By: Niel Hummer MD, Lorinda Creed    Past Surgical History:  Procedure Laterality Date  . CHOLECYSTECTOMY N/A 11/20/2016   Procedure: LAPAROSCOPIC CHOLECYSTECTOMY WITH INTRAOPERATIVE CHOLANGIOGRAM;  Surgeon: Leighton Ruff, MD;  Location: [facility];  Service: General;  Laterality: N/A;  . G0P0    . LAPAROSCOPY  1994   x 2 for endometriosis  . LAPAROTOMY  1989   for benign tumor  . NECK MASS EXCISION  1974   benign mass left    reports that she quit smoking about 27 years ago. Her smoking use included cigarettes. She has a 28.50 pack-year smoking history. She has never used smokeless tobacco. She reports that she does  not drink alcohol or use drugs. family history includes Arthritis in her father; Diabetes in her mother; Heart disease in her sister; Hypertension in her mother. Allergies  Allergen Reactions  . Sertraline Hcl Hives and Itching  . Hydrocodone Nausea Only  . Sulfa Antibiotics Hives  . Sulfamethoxazole Hives  . Codeine Nausea Only    Outpatient Medications Prior to Visit  Medication Sig Dispense Refill  . alprazolam (XANAX) 2 MG tablet TAKE 1 TABLET BY MOUTH THREE TIMES A DAY AS NEEDED FOR ANXIETY 270 tablet 0  . butalbital-acetaminophen-caffeine (FIORICET, ESGIC) 50-325-40 MG tablet TAKE 1 TABLET EVERY 6 HOURSAS NEEDED FOR HEADACHE 90 tablet 0  . Cholecalciferol (VITAMIN D3) 2000 units capsule Take 1 capsule (2,000 Units total) daily by mouth. 100 capsule 3  . Cholecalciferol (VITAMIN D3) 50000 units CAPS Take 1 capsule once a week by mouth. 8 capsule 0  . Cyanocobalamin (VITAMIN B-12) 500 MCG SUBL 1 sl qd (Patient taking differently: Place 500 mcg under the tongue daily. ) 150 tablet   . diclofenac (VOLTAREN) 75 MG EC tablet Take 75 mg by mouth 2 (two) times daily with a meal.  3  . hydrochlorothiazide (MICROZIDE) 12.5 MG capsule Take 1 capsule (12.5 mg total) by mouth daily. Patient needs office visit before refills will be given 90 capsule  3  . losartan (COZAAR) 50 MG tablet Take 1 tablet (50 mg total) by mouth daily. Patient needs office visit before refills will be given 90 tablet 0  . omega-3 acid ethyl esters (LOVAZA) 1 g capsule Take 1 g by mouth daily.    [name] omeprazole (PRILOSEC) 20 MG capsule Take 1 capsule (20 mg total) by mouth daily. 30 capsule 11  . venlafaxine XR (EFFEXOR-XR) 150 MG 24 hr capsule Take 1 capsule (150 mg total) by mouth daily with breakfast. 90 capsule 3  . ciprofloxacin (CIPRO) 250 MG tablet Take 1 tablet (250 mg total) by mouth 2 (two) times daily. 6 tablet 0  . hydrochlorothiazide (MICROZIDE) 12.5 MG capsule Take 1 capsule (12.5 mg total) by mouth daily. Pt.  Need to schedule an appointment for further refills. 30 capsule 0   No facility-administered medications prior to visit.     ROS: Review of Systems  Constitutional: Negative for activity change, appetite change, chills, fatigue and unexpected weight change.  HENT: Negative for congestion, mouth sores and sinus pressure.   Eyes: Negative for visual disturbance.  Respiratory: Negative for cough, chest tightness and wheezing.   Cardiovascular: Negative for chest pain, palpitations and leg swelling.  Gastrointestinal: Negative for abdominal pain and nausea.  Genitourinary: Negative for difficulty urinating, frequency and vaginal pain.  Musculoskeletal: Positive for arthralgias and gait problem. Negative for back pain.  Skin: Negative for pallor and rash.  Neurological: Negative for dizziness, tremors, weakness, numbness and headaches.  Psychiatric/Behavioral: Positive for decreased concentration and sleep disturbance. Negative for confusion and suicidal ideas. The patient is not nervous/anxious and is not hyperactive.     Objective:  BP 130/80 (BP Location: Left Arm, Patient Position: Sitting, Cuff Size: Normal)   Pulse 90   Temp 97.9 F (36.6 C) (Oral)   Ht 5\' 2"  (1.575 m)   Wt 164 lb (74.4 kg)   SpO2 97%   BMI 30.00 kg/m   BP Readings from Last 3 Encounters:  03/29/18 130/80  03/16/17 110/80  11/22/16 124/68    Wt Readings from Last 3 Encounters:  03/29/18 164 lb (74.4 kg)  03/16/17 158 lb 4 oz (71.8 kg)  11/20/16 160 lb (72.6 kg)    Physical Exam  Constitutional: She appears well-developed. No distress.  HENT:  Head: Normocephalic.  Right Ear: External ear normal.  Left Ear: External ear normal.  Nose: Nose normal.  Mouth/Throat: Oropharynx is clear and moist.  Eyes: Pupils are equal, round, and reactive to light. Conjunctivae are normal. Right eye exhibits no discharge. Left eye exhibits no discharge.  Neck: Normal range of motion. Neck supple. No JVD present. No  tracheal deviation present. No thyromegaly present.  Cardiovascular: Normal rate, regular rhythm and normal heart sounds.  Pulmonary/Chest: No stridor. No respiratory distress. She has no wheezes.  Abdominal: Soft. Bowel sounds are normal. She exhibits no distension and no mass. There is no tenderness. There is no rebound and no guarding.  Musculoskeletal: She exhibits tenderness. She exhibits no edema.  Lymphadenopathy:    She has no cervical adenopathy.  Neurological: She displays normal reflexes. No cranial nerve deficit. She exhibits normal muscle tone. Coordination normal.  Skin: No rash noted. No erythema.  Psychiatric: She has a normal mood and affect. Her behavior is normal. Judgment and thought content normal.  R knee is tender  Lab Results  Component Value Date   WBC 5.4 03/18/2017   HGB 14.3 03/18/2017   HCT 43.3 03/18/2017   PLT 308.0 03/18/2017  GLUCOSE 96 03/18/2017   CHOL 200 03/18/2017   TRIG 210.0 (H) 03/18/2017   HDL 47.70 03/18/2017   LDLDIRECT 98.0 03/18/2017   LDLCALC 71 11/22/2012   ALT 26 03/18/2017   AST 27 03/18/2017   NA 139 03/18/2017   K 4.0 03/18/2017   CL 103 03/18/2017   CREATININE 0.69 03/18/2017   BUN 11 03/18/2017   CO2 28 03/18/2017   TSH 2.17 03/18/2017    Dg Chest 2 View  Result Date: 11/19/2016 CLINICAL DATA:  Mid chest pain since last evening. EXAM: CHEST  2 VIEW COMPARISON:  06/19/2015; 07/28/2006; 03/18/2009 FINDINGS: Grossly unchanged cardiac silhouette and mediastinal contours. Bilateral infrahilar opacities are unchanged and favored to represent atelectasis or scar. No new focal airspace opacities. No pleural effusion or pneumothorax. No evidence of edema. No acute osseous abnormalities. IMPRESSION: No acute cardiopulmonary disease. Electronically Signed   By: Sandi Mariscal M.D.   On: 11/19/2016 10:16   Dg Cholangiogram Operative  Result Date: 11/20/2016 CLINICAL DATA:  67 year old female with cholecystitis EXAM: INTRAOPERATIVE  CHOLANGIOGRAM TECHNIQUE: Cholangiographic images from the C-arm fluoroscopic device were submitted for interpretation post-operatively. Please see the procedural report for the amount of contrast and the fluoroscopy time utilized. COMPARISON:  Abdominal ultrasound 11/19/2016 FINDINGS: Cine clip obtained during intraoperative cholangiogram at the time the laparoscopic cholecystectomy. The images demonstrate cannulation of the cystic duct remnant and opacification of the biliary tree. No evidence of biliary ductal dilatation, stenosis, stricture or choledocholithiasis. Contrast material passes freely through the ampulla and into the duodenum. IMPRESSION: Negative intraoperative cholangiogram. Electronically Signed   By: Jacqulynn Cadet M.D.   On: 11/20/2016 09:27   US Abdomen Limited  Result Date: 11/19/2016 CLINICAL DATA:  67 year old female with right upper quadrant abdominal pain since 0100 hours. EXAM: ULTRASOUND ABDOMEN LIMITED RIGHT UPPER QUADRANT COMPARISON:  Chest CT 03/18/2009. FINDINGS: Gallbladder: Shadowing 28 mm gallstone within the neck of the gallbladder (image 8). The stone seems to be nonmobile on these images. Gallbladder wall thickness remains normal, and no pericholecystic fluid is identified. The patient had received pain medication prior to this exam, and no sonographic Murphy sign was elicited. Common Bile Duct: 7 mm, upper limits of normal to mildly dilated, although a 6-7 mm CBD is demonstrated on the 2010 chest CT. Liver: No intrahepatic biliary ductal dilatation. No discrete liver lesion. Hepatic echogenicity within normal limits. Other findings: Negative visible right kidney. IMPRESSION: 1. Gallstone measuring 2.8 cm which might be lodged in the neck of the gallbladder. Equivocal sonographic Percell Miller sign due to prior pain medication, but no other ultrasound features of acute cholecystitis. 2. CBD at the upper limits of normal. No intrahepatic biliary ductal dilatation to strongly  suggest acute biliary obstruction. Electronically Signed   By: Genevie Ann M.D.   On: 11/19/2016 12:35    Assessment & Plan:   There are no diagnoses linked to this encounter.   No orders of the defined types were placed in this encounter.    Follow-up: No follow-ups on file.  Walker Kehr, MD

## 2018-03-29 NOTE — Assessment & Plan Note (Signed)
Wt Readings from Last 3 Encounters:  03/29/18 164 lb (74.4 kg)  03/16/17 158 lb 4 oz (71.8 kg)  11/20/16 160 lb (72.6 kg)  On diet

## 2018-03-29 NOTE — Assessment & Plan Note (Signed)
B R>L Dr Noemi Chapel will do R TKR on 04/21/18

## 2018-03-29 NOTE — Assessment & Plan Note (Signed)
Xanax at hs prn  Potential benefits of a long term benzodiazepines  use as well as potential risks  and complications were explained to the patient and were aknowledged.

## 2018-03-29 NOTE — Assessment & Plan Note (Signed)
On Effexor Xanax prn Doxepine at hs  Potential benefits of a long term benzodiazepines  use as well as potential risks  and complications were explained to the patient and were aknowledged.

## 2018-03-29 NOTE — Assessment & Plan Note (Signed)
Chronic -- pt  took Adderall XR x many years, it helped 2019 - we can try a low dose  Potential benefits of a long term amphetamines  use as well as potential risks  and complications were explained to the patient and were aknowledged.

## 2018-03-29 NOTE — Patient Instructions (Signed)

## 2018-03-29 NOTE — Assessment & Plan Note (Signed)
The pt is medically clear for R TKR by Dr Noemi Chapel. Labs are obtained. Thank you!

## 2018-03-29 NOTE — Telephone Encounter (Signed)
Your PA has been resolved, no additional PA is required. For further inquiries please contact the number on the back of the member prescription card. (Message [phone])

## 2018-03-29 NOTE — Telephone Encounter (Signed)
Key: [id number]

## 2018-04-05 DIAGNOSIS — M545 Low back pain: Secondary | ICD-10-CM | POA: Diagnosis not present

## 2018-04-05 DIAGNOSIS — M25551 Pain in right hip: Secondary | ICD-10-CM | POA: Diagnosis not present

## 2018-04-07 DIAGNOSIS — M545 Low back pain: Secondary | ICD-10-CM | POA: Diagnosis not present

## 2018-04-10 DIAGNOSIS — M5136 Other intervertebral disc degeneration, lumbar region: Secondary | ICD-10-CM | POA: Diagnosis not present

## 2018-04-10 DIAGNOSIS — M25561 Pain in right knee: Secondary | ICD-10-CM | POA: Diagnosis not present

## 2018-04-10 DIAGNOSIS — M1711 Unilateral primary osteoarthritis, right knee: Secondary | ICD-10-CM | POA: Diagnosis not present

## 2018-04-11 ENCOUNTER — Encounter: Payer: Self-pay | Admitting: Physician Assistant

## 2018-04-11 DIAGNOSIS — M1711 Unilateral primary osteoarthritis, right knee: Secondary | ICD-10-CM

## 2018-04-11 DIAGNOSIS — M47817 Spondylosis without myelopathy or radiculopathy, lumbosacral region: Secondary | ICD-10-CM

## 2018-04-11 DIAGNOSIS — F819 Developmental disorder of scholastic skills, unspecified: Secondary | ICD-10-CM | POA: Diagnosis present

## 2018-04-11 HISTORY — DX: Unilateral primary osteoarthritis, right knee: M17.11

## 2018-04-11 HISTORY — DX: Spondylosis without myelopathy or radiculopathy, lumbosacral region: M47.817

## 2018-04-11 NOTE — H&P (Addendum)
TOTAL KNEE ADMISSION H&P  Patient is being admitted for right total knee arthroplasty.  Subjective:  Chief Complaint:right knee pain.  HPI: Julia Zimmerman, 67 y.o. female, has a history of pain and functional disability in the right knee due to arthritis and has failed non-surgical conservative treatments for greater than 12 weeks to includeNSAID's and/or analgesics, corticosteriod injections, viscosupplementation injections, flexibility and strengthening excercises, supervised PT with diminished ADL's post treatment, use of assistive devices, weight reduction as appropriate and activity modification.  Onset of symptoms was gradual, starting 10 years ago with gradually worsening course since that time. The patient noted no past surgery on the right knee(s).  Patient currently rates pain in the right knee(s) at 10 out of 10 with activity. Patient has night pain, worsening of pain with activity and weight bearing, pain that interferes with activities of daily living, crepitus and joint swelling.  Patient has evidence of subchondral sclerosis, periarticular osteophytes and joint space narrowing by imaging studies. There is no active infection.  Patient Active Problem List   Diagnosis Date Noted  . Primary localized osteoarthritis of right knee 04/11/2018  . Lumbosacral facet joint syndrome 04/11/2018  . Learning disability   . Knee pain, chronic 03/29/2018  . Preoperative clearance 03/29/2018  . Hot flashes 03/16/2017  . Symptomatic cholelithiasis 11/19/2016  . Insomnia 04/23/2016  . Arthralgia 03/12/2016  . Pain in joint, shoulder region 03/12/2016  . Left upper arm pain 07/15/2015  . Chest pain 07/15/2015  . Complicated grief 54/65/0354  . Acute sinusitis 01/15/2015  . Well adult exam 01/02/2014  . Headache 01/02/2014  . Obesity 09/17/2013  . HYPERLIPIDEMIA 05/15/2010  . Essential hypertension, benign 05/15/2010  . Hyperlipidemia 05/15/2010  . RHINITIS 04/21/2010  . GERD 04/21/2010   . ADD (attention deficit disorder) 10/30/2008  . Palpitations 11/07/2007  . ANXIETY DEPRESSION 03/29/2007   Past Medical History:  Diagnosis Date  . ADD 10/30/2008   Qualifier: Diagnosis of  By: Niel Hummer MD, Lorinda Creed Anxiety   . ANXIETY DEPRESSION 03/29/2007   Qualifier: Diagnosis of  By: Niel Hummer MD, Lorinda Creed   . Cough 04/21/2010   full eval including allergist. Working diagnosis - reflux.  . Depression   . Endometriosis   . Episodic tension type headache 11/07/2007   Qualifier: Diagnosis of  By: Niel Hummer MD, [place] hypertension, benign 05/15/2010   Qualifier: Diagnosis of  By: Elizebeth Koller MD, Mora Appl   . GERD 04/21/2010   Qualifier: Diagnosis of  By: Niel Hummer MD, Lorinda Creed   . GERD (gastroesophageal reflux disease)   . Hyperlipidemia   . HYPERLIPIDEMIA 05/15/2010   Qualifier: Diagnosis of  By: Elizebeth Koller MD, Mora Appl   . Learning disability    poor attention span and difficulty with retention  . Lumbosacral facet joint syndrome 04/11/2018  . ONYCHOMYCOSIS 10/30/2008   successfully treated with lamisil.  . Primary localized osteoarthritis of right knee 04/11/2018  . RHINITIS 04/21/2010   Qualifier: Diagnosis of  By: Niel Hummer MD, Lorinda Creed     Past Surgical History:  Procedure Laterality Date  . CHOLECYSTECTOMY N/A 11/20/2016   Procedure: LAPAROSCOPIC CHOLECYSTECTOMY WITH INTRAOPERATIVE CHOLANGIOGRAM;  Surgeon: Leighton Ruff, MD;  Location: [facility];  Service: General;  Laterality: N/A;  . G0P0    . LAPAROSCOPY  1994   x 2 for endometriosis  . LAPAROTOMY  1989   for benign tumor  . NECK MASS EXCISION  1974   benign mass left  No current facility-administered medications for this encounter.    Current Outpatient Medications  Medication Sig Dispense Refill Last Dose  . alprazolam (XANAX) 2 MG tablet TAKE 1 TABLET BY MOUTH qhs prn insomnia (Patient taking differently: Take 2 mg by mouth at bedtime. ) 90 tablet 1 04/11/2018 at Unknown time  .  butalbital-acetaminophen-caffeine (FIORICET, ESGIC) 50-325-40 MG tablet TAKE 1-2 TABLET EVERY 6 HOURSAS NEEDED FOR HEADACHE (Patient taking differently: Take 2 tablets by mouth every 6 (six) hours as needed for headache. ) 90 tablet 1 04/11/2018 at Unknown time  . diphenhydrAMINE (BENADRYL) 25 MG tablet Take 25 mg by mouth daily as needed for allergies.   04/11/2018 at Unknown time  . hydrochlorothiazide (MICROZIDE) 12.5 MG capsule Take 1 capsule (12.5 mg total) by mouth daily. Patient needs office visit before refills will be given 90 capsule 3 04/11/2018 at Unknown time  . losartan (COZAAR) 50 MG tablet Take 1 tablet (50 mg total) by mouth daily. Patient needs office visit before refills will be given 90 tablet 0 04/11/2018 at Unknown time  . omeprazole (PRILOSEC) 20 MG capsule Take 1 capsule (20 mg total) by mouth daily. (Patient taking differently: Take 20 mg by mouth daily as needed (heartburn). ) 30 capsule 11 04/11/2018 at Unknown time  . venlafaxine XR (EFFEXOR-XR) 150 MG 24 hr capsule Take 1 capsule (150 mg total) by mouth daily with breakfast. (Patient taking differently: Take 150 mg by mouth 2 (two) times a week. Sundays and Thursdays) 90 capsule 3 04/11/2018 at Unknown time  . amphetamine-dextroamphetamine (ADDERALL XR) 5 MG 24 hr capsule Take 1 capsule (5 mg total) by mouth daily. 30 capsule 0   . Cholecalciferol (VITAMIN D3) 2000 units capsule Take 1 capsule (2,000 Units total) daily by mouth. (Patient not taking: Reported on 04/10/2018) 100 capsule 3 Not Taking at Unknown time  . Cholecalciferol (VITAMIN D3) 50000 units CAPS Take 1 capsule once a week by mouth. (Patient not taking: Reported on 04/10/2018) 8 capsule 0 Not Taking at Unknown time  . Cyanocobalamin (VITAMIN B-12) 500 MCG SUBL 1 sl qd (Patient not taking: Reported on 04/10/2018) 150 tablet  Not Taking at Unknown time   Allergies  Allergen Reactions  . Sertraline Hcl Hives and Itching  . Hydrocodone Nausea Only  . Sulfa  Antibiotics Hives  . Codeine Nausea Only    Social History   Tobacco Use  . Smoking status: Former Smoker    Packs/day: 1.50    Years: 19.00    Pack years: 28.50    Types: Cigarettes    Last attempt to quit: 10/27/1990    Years since quitting: 27.4  . Smokeless tobacco: Never Used  Substance Use Topics  . Alcohol use: No    Family History  Problem Relation Age of Onset  . Hypertension Mother   . Diabetes Mother   . Arthritis Father        gout  . Heart disease Sister        palpitations  . Cancer Neg Hx   . COPD Neg Hx   . Colon cancer Neg Hx      Review of Systems  Constitutional: Negative.   HENT: Negative.   Eyes: Negative.   Respiratory: Negative.   Cardiovascular: Negative.   Gastrointestinal: Negative.   Genitourinary: Negative.   Musculoskeletal: Positive for back pain and joint pain.  Skin: Negative.   Neurological: Negative.   Endo/Heme/Allergies: Negative.   Psychiatric/Behavioral: Positive for depression. The patient is nervous/anxious.  Objective:  Physical Exam  Constitutional: She is oriented to person, place, and time. She appears well-developed and well-nourished.  HENT:  Head: Normocephalic and atraumatic.  Mouth/Throat: Oropharynx is clear and moist.  Eyes: Conjunctivae are normal.  Neck: Normal range of motion. Neck supple.  Cardiovascular: Normal rate and regular rhythm.  Respiratory: Effort normal and breath sounds normal.  GI: Soft. Bowel sounds are normal.  Genitourinary:  Genitourinary Comments: Not pertinent to current symptomatology therefore not examined.  Musculoskeletal:  Examination of her right knee reveals pain medially.  Moderate varus deformity.  1+ synovitis.  Range of motion -5 to 125 degrees.  Knee is stable with normal patella tracking.  Examination of the left knee reveals pain medially.  1+ synovitis.  Full range of motion.  Knee is stable with normal patella tracking.  Vascular exam: Pulses are 2+ and symmetric.     Neurological: She is alert and oriented to person, place, and time.  Skin: Skin is warm and dry.  Psychiatric: She has a normal mood and affect. Her behavior is normal.    Vital signs in last 24 hours:    Labs:   Estimated body mass index is 30 kg/m as calculated from the following:   Height as of 03/29/18: 5\' 2"  (1.575 m).   Weight as of 03/29/18: 74.4 kg.   Imaging Review Plain radiographs demonstrate severe degenerative joint disease of the right knee(s). The overall alignment issignificant varus. The bone quality appears to be good for age and reported activity level.   Preoperative templating of the joint replacement has been completed, documented, and submitted to the Operating Room personnel in order to optimize intra-operative equipment management.   Anticipated LOS equal to or greater than 2 midnights due to - Age 78 and older with one or more of the following:  - Obesity  - Expected need for hospital services (PT, OT, Nursing) required for safe  discharge  - Anticipated need for postoperative skilled nursing care or inpatient rehab  - Active co-morbidities: Chronic pain requiring opiods OR   - Unanticipated findings during/Post Surgery: None  - Patient is a high risk of re-admission due to: None     Assessment/Plan:  End stage arthritis, right knee Principal Problem:   Primary localized osteoarthritis of right knee Active Problems:   ADD (attention deficit disorder)   Essential hypertension, benign   Obesity   Learning disability   Hyperlipidemia   Lumbosacral facet joint syndrome    The patient history, physical examination, clinical judgment of the provider and imaging studies are consistent with end stage degenerative joint disease of the right knee(s) and total knee arthroplasty is deemed medically necessary. The treatment options including medical management, injection therapy arthroscopy and arthroplasty were discussed at length. The risks and  benefits of total knee arthroplasty were presented and reviewed. The risks due to aseptic loosening, infection, stiffness, patella tracking problems, thromboembolic complications and other imponderables were discussed. The patient acknowledged the explanation, agreed to proceed with the plan and consent was signed. Patient is being admitted for inpatient treatment for surgery, pain control, PT, OT, prophylactic antibiotics, VTE prophylaxis, progressive ambulation and ADL's and discharge planning. The patient is planning to be discharged home with home health services

## 2018-04-13 NOTE — Pre-Procedure Instructions (Signed)
Julia Zimmerman  04/13/2018      [id number] , [place] - [address] AT [address] [place] [address] Phone: [phone] Fax: [phone]    Your procedure is scheduled on Monday December 23.  Report to [facility] Admitting at 5:30 A.M.  Call this number if you have problems the morning of surgery:  [phone]   Remember:  Do not eat or drink after midnight.    Take these medicines the morning of surgery with A SIP OF WATER:   Omeprazole (prilosec) Venlafaxine (Effexor)  DO NOT take Adderall (Amphetamine-dextroamphetamine) the day of surgery  7 days prior to surgery STOP taking any Aspirin(unless otherwise instructed by your surgeon), Aleve, Naproxen, Ibuprofen, Motrin, Advil, Goody's, BC's, all herbal medications, fish oil, and all vitamins     Do not wear jewelry, make-up or nail polish.  Do not wear lotions, powders, or perfumes, or deodorant.  Do not shave 48 hours prior to surgery.  Men may shave face and neck.  Do not bring valuables to the hospital.  [facility] is not responsible for any belongings or valuables.  Contacts, dentures or bridgework may not be worn into surgery.  Leave your suitcase in the car.  After surgery it may be brought to your room.  For patients admitted to the hospital, discharge time will be determined by your treatment team.  Patients discharged the day of surgery will not be allowed to drive home.   Special instructions:    [facility]- Preparing For Surgery  Before surgery, you can play an important role. Because skin is not sterile, your skin needs to be as free of germs as possible. You can reduce the number of germs on your skin by washing with CHG (chlorahexidine gluconate) Soap before surgery.  CHG is an antiseptic cleaner which kills germs and bonds with the skin to continue killing germs even after washing.    Oral Hygiene is also important  to reduce your risk of infection.  Remember - BRUSH YOUR TEETH THE MORNING OF SURGERY WITH YOUR REGULAR TOOTHPASTE  Please do not use if you have an allergy to CHG or antibacterial soaps. If your skin becomes reddened/irritated stop using the CHG.  Do not shave (including legs and underarms) for at least 48 hours prior to first CHG shower. It is OK to shave your face.  Please follow these instructions carefully.   1. Shower the NIGHT BEFORE SURGERY and the MORNING OF SURGERY with CHG.   2. If you chose to wash your hair, wash your hair first as usual with your normal shampoo.  3. After you shampoo, rinse your hair and body thoroughly to remove the shampoo.  4. Use CHG as you would any other liquid soap. You can apply CHG directly to the skin and wash gently with a scrungie or a clean washcloth.   5. Apply the CHG Soap to your body ONLY FROM THE NECK DOWN.  Do not use on open wounds or open sores. Avoid contact with your eyes, ears, mouth and genitals (private parts). Wash Face and genitals (private parts)  with your normal soap.  6. Wash thoroughly, paying special attention to the area where your surgery will be performed.  7. Thoroughly rinse your body with warm water from the neck down.  8. DO NOT shower/wash with your normal soap after using and rinsing off the CHG Soap.  9. Pat yourself dry with a  CLEAN TOWEL.  10. Wear CLEAN PAJAMAS to bed the night before surgery, wear comfortable clothes the morning of surgery  11. Place CLEAN SHEETS on your bed the night of your first shower and DO NOT SLEEP WITH PETS.    Day of Surgery:  Do not apply any deodorants/lotions.  Please wear clean clothes to the hospital/surgery center.   Remember to brush your teeth WITH YOUR REGULAR TOOTHPASTE.    Please read over the following fact sheets that you were given. Coughing and Deep Breathing, MRSA Information and Surgical Site Infection Prevention

## 2018-04-14 ENCOUNTER — Other Ambulatory Visit: Payer: Self-pay

## 2018-04-14 ENCOUNTER — Encounter (HOSPITAL_COMMUNITY)
Admission: RE | Admit: 2018-04-14 | Discharge: 2018-04-14 | Disposition: A | Discharge status: Home / Self Care | Payer: BLUE CROSS/BLUE SHIELD | Source: Ambulatory Visit | Attending: Orthopedic Surgery | Admitting: Orthopedic Surgery

## 2018-04-14 ENCOUNTER — Encounter (HOSPITAL_COMMUNITY): Payer: Self-pay

## 2018-04-14 DIAGNOSIS — Z01818 Encounter for other preprocedural examination: Secondary | ICD-10-CM | POA: Insufficient documentation

## 2018-04-14 LAB — CBC WITH DIFFERENTIAL/PLATELET
Abs Immature Granulocytes: 0.02 10*3/uL (ref 0.00–0.07)
BASOS PCT: 1 %
Basophils Absolute: 0.1 10*3/uL (ref 0.0–0.1)
Eosinophils Absolute: 0.2 10*3/uL (ref 0.0–0.5)
Eosinophils Relative: 3 %
HCT: 43.7 % (ref 36.0–46.0)
Hemoglobin: 14.2 g/dL (ref 12.0–15.0)
Immature Granulocytes: 0 %
Lymphocytes Relative: 26 %
Lymphs Abs: 1.7 10*3/uL (ref 0.7–4.0)
MCH: 29.3 pg (ref 26.0–34.0)
MCHC: 32.5 g/dL (ref 30.0–36.0)
MCV: 90.3 fL (ref 80.0–100.0)
MONOS PCT: 10 %
Monocytes Absolute: 0.7 10*3/uL (ref 0.1–1.0)
Neutro Abs: 4.1 10*3/uL (ref 1.7–7.7)
Neutrophils Relative %: 60 %
Platelets: 329 10*3/uL (ref 150–400)
RBC: 4.84 MIL/uL (ref 3.87–5.11)
RDW: 12.3 % (ref 11.5–15.5)
WBC: 6.8 10*3/uL (ref 4.0–10.5)
nRBC: 0 % (ref 0.0–0.2)

## 2018-04-14 LAB — COMPREHENSIVE METABOLIC PANEL
ALT: 23 U/L (ref 0–44)
AST: 23 U/L (ref 15–41)
Albumin: 4 g/dL (ref 3.5–5.0)
Alkaline Phosphatase: 102 U/L (ref 38–126)
Anion gap: 12 (ref 5–15)
BUN: 18 mg/dL (ref 8–23)
CHLORIDE: 101 mmol/L (ref 98–111)
CO2: 27 mmol/L (ref 22–32)
CREATININE: 0.82 mg/dL (ref 0.44–1.00)
Calcium: 9.4 mg/dL (ref 8.9–10.3)
GFR calc Af Amer: >60 mL/min (ref >60)
GFR calc non Af Amer: >60 mL/min (ref >60)
Glucose, Bld: 96 mg/dL (ref 70–99)
POTASSIUM: 3.7 mmol/L (ref 3.5–5.1)
Sodium: 140 mmol/L (ref 135–145)
Total Bilirubin: 0.4 mg/dL (ref 0.3–1.2)
Total Protein: 7.4 g/dL (ref 6.5–8.1)

## 2018-04-14 LAB — SURGICAL PCR SCREEN
MRSA, PCR: NEGATIVE
Staphylococcus aureus: NEGATIVE

## 2018-04-14 LAB — PROTIME-INR
INR: 0.97
Prothrombin Time: 12.8 seconds (ref 11.4–15.2)

## 2018-04-14 LAB — APTT: aPTT: 31 seconds (ref 24–36)

## 2018-04-14 NOTE — Progress Notes (Signed)
PCP: Lew Dawes  Cardiologist: Dorris Carnes  DM: denies  SA: denies  Pt denies SOB, cough, fever, chest pain  Pt stated understanding of instructions given for DOS.

## 2018-04-15 LAB — URINE CULTURE: Culture: NO GROWTH

## 2018-04-17 ENCOUNTER — Ambulatory Visit (INDEPENDENT_AMBULATORY_CARE_PROVIDER_SITE_OTHER)
Admission: RE | Admit: 2018-04-17 | Discharge: 2018-04-17 | Disposition: A | Discharge status: Home / Self Care | Payer: BLUE CROSS/BLUE SHIELD | Source: Ambulatory Visit | Attending: Internal Medicine | Admitting: Internal Medicine

## 2018-04-17 DIAGNOSIS — I10 Essential (primary) hypertension: Secondary | ICD-10-CM

## 2018-04-17 DIAGNOSIS — E785 Hyperlipidemia, unspecified: Secondary | ICD-10-CM

## 2018-04-17 NOTE — Progress Notes (Addendum)
Anesthesia Chart Review:  Case:  [id number] Date/Time:  04/24/18 0700   Procedure:  TOTAL KNEE ARTHROPLASTY (Right )   Anesthesia type:  Spinal   Pre-op diagnosis:  djd right knee   Location:  [facility] OR ROOM 07 / [facility]   Surgeon:  Elsie Saas, MD      DISCUSSION: Patient is a 67 year old female scheduled for the above procedure.   History includes HLD, GERD, ADD, HTN, former smoker (quit '92).   PCP Plotnikov, Evie Lacks, MD medically cleared her for surgery following 03/29/18 evaluation. He ordered a coronary calcium score (as part of well adult screening exams) which was done on 04/17/18 and showed score of 97, 81% for age and sex matched control. Will plan to follow-up if any additional recommendations, although she had a normal stress test 07/23/16 with PRN cardiology follow-up recommended. She denied SOB, cough, fever, and chest pain at PAT.   ADDENDUM 04/19/18 2:21 PM: Dr. Alain Marion discussed chest CT and coronary calcium score with patient. He is recommending she start Vascepa or statin. He will repeat her chest CT in one year to follow-up possible RML nodule. Reviewed chart with anesthesiologist Roberts Gaudy, MD. Dr. Alain Marion cleared patient for surgery and also reviewed her recent coronary calcium score results. She denied CV symptoms. She had a non-ischemic stress test in 07/2016. If no acute changes then it is anticipated that she can proceed as planned.   VS: BP 133/80   Pulse 89   Temp 36.6 C   Resp 20   Ht 5\' 2"  (1.575 m)   Wt 74.6 kg   SpO2 99%   BMI 30.07 kg/m   PROVIDERS: Plotnikov, Evie Lacks, MD is PCP - Dorris Carnes, MD is cardiologist. Last visit 08/23/16 for follow-up SOB and palpitations. PRN cardiology follow-up recommended following stress, echo, and event monitor.   LABS: Labs reviewed: Acceptable for surgery. (all labs ordered are listed, but only abnormal results are displayed)  Labs Reviewed  URINE CULTURE  SURGICAL PCR SCREEN  APTT  CBC WITH  DIFFERENTIAL/PLATELET  COMPREHENSIVE METABOLIC PANEL  PROTIME-INR    IMAGES: CT Chest 04/17/18: IMPRESSION: 1. New nodular thickening adjacent to chronic atelectasis in RIGHT middle lobe. In patient with smoking history recommend follow-up CT exam. Non-contrast chest CT at 6-12 months is recommended. If the nodule is stable at time of repeat CT, then future CT at 18-24 months (from today's scan) is considered optional for low-risk patients, but is recommended for high-risk patients. This recommendation follows the consensus statement: Guidelines for Management of Incidental Pulmonary Nodules Detected on CT Images: From the Fleischner Society 2017; Radiology 2017; 284:228-243. 2. No additional significant extracardiac findings.   EKG: 04/14/18: NSR   CV: CT coronary calcium score 04/17/18 (ordered by Plotnikov, Evie Lacks, MD): FINDINGS: - Non-cardiac: See separate report from [facility]. - Ascending aorta: Normal diameter 3.2 cm -Pericardium: Normal -Coronary arteries: Calcium noted in proximal and mid RCA/LAD IMPRESSION:  Coronary calcium score of 97. This was 21 st percentile for age and sex matched control. (Results reviewed by Dr. Alain Marion. He noted results "consistent with a mild degree of coronary disease. You have plaque noted in your right and left coronary arteries. You would benefit from mild taking either Vascepa (I gave you prescription for it before) or a conventional lipid-lowering medication like Lipitor." He will plan to repeat CT chest in 12 months.)   Nuclear stress test 07/23/16:  Nuclear stress EF: 68%.  The study is normal.  This is  a low risk study.  The left ventricular ejection fraction is hyperdynamic (>65%).  There was no ST segment deviation noted during stress. Normal exercise nuclear stress test with no evidence for prior infarct or ischemia.  Normal BP response to exercise. Mildly decreased exercise capacity.  Cardiac Event  Monitor 06/09/16-07/08/16: Sinus rhythm, sinus tachycardia Symptoms did not correlate with an arrhythmia  Echo 06/11/16: Study Conclusions - Left ventricle: The cavity size was normal. Systolic function was   normal. The estimated ejection fraction was in the range of 55%   to 60%. Wall motion was normal; there were no regional wall   motion abnormalities. There was an increased relative   contribution of atrial contraction to ventricular filling.   Doppler parameters are consistent with abnormal left ventricular   relaxation (grade 1 diastolic dysfunction). - Aortic valve: Trileaflet; normal thickness, mildly calcified   leaflets. - Mitral valve: Mild diffuse calcification of the anterior leaflet.   There was mild regurgitation. - Left atrium: The atrium was mildly dilated. - Atrial septum: There was increased thickness of the septum,   consistent with lipomatous hypertrophy. - Pulmonary arteries: Systolic pressure could not be accurately   estimated.   Past Medical History:  Diagnosis Date  . ADD 10/30/2008   Qualifier: Diagnosis of  By: Niel Hummer MD, Lorinda Creed Anxiety   . ANXIETY DEPRESSION 03/29/2007   Qualifier: Diagnosis of  By: Niel Hummer MD, Lorinda Creed   . Cough 04/21/2010   full eval including allergist. Working diagnosis - reflux.  . Depression   . Endometriosis   . Episodic tension type headache 11/07/2007   Qualifier: Diagnosis of  By: Niel Hummer MD, [place] hypertension, benign 05/15/2010   Qualifier: Diagnosis of  By: Elizebeth Koller MD, Mora Appl   . GERD 04/21/2010   Qualifier: Diagnosis of  By: Niel Hummer MD, Lorinda Creed   . GERD (gastroesophageal reflux disease)   . Hyperlipidemia   . HYPERLIPIDEMIA 05/15/2010   Qualifier: Diagnosis of  By: Elizebeth Koller MD, Mora Appl   . Learning disability    poor attention span and difficulty with retention  . Lumbosacral facet joint syndrome 04/11/2018  . ONYCHOMYCOSIS 10/30/2008   successfully treated with lamisil.  .  Primary localized osteoarthritis of right knee 04/11/2018  . RHINITIS 04/21/2010   Qualifier: Diagnosis of  By: Niel Hummer MD, Lorinda Creed     Past Surgical History:  Procedure Laterality Date  . CHOLECYSTECTOMY N/A 11/20/2016   Procedure: LAPAROSCOPIC CHOLECYSTECTOMY WITH INTRAOPERATIVE CHOLANGIOGRAM;  Surgeon: Leighton Ruff, MD;  Location: [facility]S;  Service: General;  Laterality: N/A;  . G0P0    . LAPAROSCOPY  1994   x 2 for endometriosis  . LAPAROTOMY  1989   for benign tumor  . MENISCUS REPAIR Left   . NECK MASS EXCISION  1974   benign mass left    MEDICATIONS: . alprazolam (XANAX) 2 MG tablet  . amphetamine-dextroamphetamine (ADDERALL XR) 5 MG 24 hr capsule  . butalbital-acetaminophen-caffeine (FIORICET, ESGIC) 50-325-40 MG tablet  . Cholecalciferol (VITAMIN D3) 2000 units capsule  . Cholecalciferol (VITAMIN D3) 50000 units CAPS  . Cyanocobalamin (VITAMIN B-12) 500 MCG SUBL  . diphenhydrAMINE (BENADRYL) 25 MG tablet  . hydrochlorothiazide (MICROZIDE) 12.5 MG capsule  . losartan (COZAAR) 50 MG tablet  . omeprazole (PRILOSEC) 20 MG capsule  . venlafaxine XR (EFFEXOR-XR) 150 MG 24 hr capsule   No current facility-administered medications for this encounter.     Myra Gianotti, PA-C  [facility] Stay [facility]/Anesthesiology Phone [phone] 04/17/2018 11:24 PM

## 2018-04-19 ENCOUNTER — Telehealth: Payer: Self-pay | Admitting: Internal Medicine

## 2018-04-19 ENCOUNTER — Inpatient Hospital Stay: Admission: RE | Admit: 2018-04-19 | Payer: Self-pay | Source: Ambulatory Visit

## 2018-04-19 NOTE — Telephone Encounter (Signed)
Copied from [place] [id number]. Topic: Quick Communication - Rx Refill/Question >> Apr 19, 2018  9:01 AM Oneta Rack wrote:  Medication:vascepa medication (chart doesn't reflect, patient states PCP sent her a message recommending taking this medication again) patient doesn't recall every being prescribe and would like to discuss in detail with PCP or nurse  Has the patient contacted their pharmacy? Yes   (Agent: If yes, when and what did the pharmacy advise?) to contact PCP office  Preferred Pharmacy (with phone number or street name):  [id number] [place], [name]. AT [phone] (Phone) [phone] (Fax)    Agent: Please be advised that RX refills may take up to 3 business days. We ask that you follow-up with your pharmacy.

## 2018-04-21 MED ORDER — BUPIVACAINE LIPOSOME 1.3 % IJ SUSP
20.0000 mL | INTRAMUSCULAR | Status: DC
  Filled 2018-04-21: qty 20

## 2018-04-21 MED ORDER — TRANEXAMIC ACID-NACL 1000-0.7 MG/100ML-% IV SOLN
1000.0000 mg | INTRAVENOUS | Status: AC
  Administered 2018-04-24: 1000 mg via INTRAVENOUS
  Filled 2018-04-21: qty 100

## 2018-04-21 MED ORDER — ICOSAPENT ETHYL 1 G PO CAPS: 2.0000 | ORAL_CAPSULE | Freq: Two times a day (BID) | ORAL | 11 refills | Status: DC

## 2018-04-21 NOTE — Telephone Encounter (Signed)
Pt would like to start Vascepa

## 2018-04-21 NOTE — Telephone Encounter (Signed)
Ok thx.

## 2018-04-23 ENCOUNTER — Encounter (HOSPITAL_COMMUNITY): Payer: Self-pay | Admitting: Anesthesiology

## 2018-04-23 NOTE — Anesthesia Preprocedure Evaluation (Addendum)
Anesthesia Evaluation  Patient identified by MRN, date of birth, ID band Patient awake    Reviewed: Allergy & Precautions, NPO status , Patient's Chart, lab work & pertinent test results  History of Anesthesia Complications (+) PONV and history of anesthetic complications  Airway Mallampati: III  TM Distance: >3 FB Neck ROM: full    Dental no notable dental hx. (+) Teeth Intact, Dental Advidsory Given   Pulmonary former smoker,    Pulmonary exam normal breath sounds clear to auscultation       Cardiovascular hypertension, Pt. on medications Normal cardiovascular exam Rhythm:Regular Rate:Normal     Neuro/Psych PSYCHIATRIC DISORDERS Anxiety Depression    GI/Hepatic Neg liver ROS, GERD  Medicated,  Endo/Other  negative endocrine ROS  Renal/GU negative Renal ROS     Musculoskeletal  (+) Arthritis ,   Abdominal Normal abdominal exam  (+)   Peds  Hematology negative hematology ROS (+)   Anesthesia Other Findings   Reproductive/Obstetrics                           Anesthesia Physical Anesthesia Plan  ASA: II  Anesthesia Plan: Spinal   Post-op Pain Management:  Regional for Post-op pain   Induction:   PONV Risk Score and Plan: 2 and Ondansetron, Dexamethasone, Midazolam and Propofol infusion  Airway Management Planned: Natural Airway, Nasal Cannula and Simple Face Mask  Additional Equipment:   Intra-op Plan:   Post-operative Plan:   Informed Consent: I have reviewed the patients History and Physical, chart, labs and discussed the procedure including the risks, benefits and alternatives for the proposed anesthesia with the patient or authorized representative who has indicated his/her understanding and acceptance.   Dental Advisory Given  Plan Discussed with: CRNA and Surgeon  Anesthesia Plan Comments:      Anesthesia Quick Evaluation

## 2018-04-24 ENCOUNTER — Encounter (HOSPITAL_COMMUNITY)
Admission: RE | Disposition: A | Discharge status: Home Health | Payer: Self-pay | Source: Home / Self Care | Attending: Orthopedic Surgery

## 2018-04-24 ENCOUNTER — Inpatient Hospital Stay (HOSPITAL_COMMUNITY): Payer: BLUE CROSS/BLUE SHIELD | Admitting: Anesthesiology

## 2018-04-24 ENCOUNTER — Encounter (HOSPITAL_COMMUNITY): Payer: Self-pay | Admitting: Anesthesiology

## 2018-04-24 ENCOUNTER — Inpatient Hospital Stay (HOSPITAL_COMMUNITY): Payer: BLUE CROSS/BLUE SHIELD | Admitting: Physician Assistant

## 2018-04-24 ENCOUNTER — Other Ambulatory Visit: Payer: Self-pay

## 2018-04-24 ENCOUNTER — Observation Stay (HOSPITAL_COMMUNITY)
Admission: RE | Admit: 2018-04-24 | Discharge: 2018-04-26 | DRG: 470 | Disposition: A | Discharge status: Home Health | Payer: BLUE CROSS/BLUE SHIELD | Attending: Orthopedic Surgery | Admitting: Orthopedic Surgery

## 2018-04-24 DIAGNOSIS — G8918 Other acute postprocedural pain: Secondary | ICD-10-CM | POA: Diagnosis not present

## 2018-04-24 DIAGNOSIS — E785 Hyperlipidemia, unspecified: Secondary | ICD-10-CM | POA: Diagnosis not present

## 2018-04-24 DIAGNOSIS — M47817 Spondylosis without myelopathy or radiculopathy, lumbosacral region: Secondary | ICD-10-CM | POA: Diagnosis not present

## 2018-04-24 DIAGNOSIS — Z9049 Acquired absence of other specified parts of digestive tract: Secondary | ICD-10-CM | POA: Diagnosis not present

## 2018-04-24 DIAGNOSIS — D62 Acute posthemorrhagic anemia: Secondary | ICD-10-CM | POA: Diagnosis not present

## 2018-04-24 DIAGNOSIS — Z8261 Family history of arthritis: Secondary | ICD-10-CM | POA: Diagnosis not present

## 2018-04-24 DIAGNOSIS — Z882 Allergy status to sulfonamides status: Secondary | ICD-10-CM | POA: Diagnosis not present

## 2018-04-24 DIAGNOSIS — Z6830 Body mass index (BMI) 30.0-30.9, adult: Secondary | ICD-10-CM | POA: Insufficient documentation

## 2018-04-24 DIAGNOSIS — I10 Essential (primary) hypertension: Secondary | ICD-10-CM | POA: Insufficient documentation

## 2018-04-24 DIAGNOSIS — Z96651 Presence of right artificial knee joint: Secondary | ICD-10-CM | POA: Diagnosis not present

## 2018-04-24 DIAGNOSIS — Z79899 Other long term (current) drug therapy: Secondary | ICD-10-CM | POA: Diagnosis not present

## 2018-04-24 DIAGNOSIS — M25761 Osteophyte, right knee: Secondary | ICD-10-CM | POA: Diagnosis present

## 2018-04-24 DIAGNOSIS — M1711 Unilateral primary osteoarthritis, right knee: Principal | ICD-10-CM | POA: Diagnosis present

## 2018-04-24 DIAGNOSIS — K219 Gastro-esophageal reflux disease without esophagitis: Secondary | ICD-10-CM | POA: Diagnosis present

## 2018-04-24 DIAGNOSIS — E669 Obesity, unspecified: Secondary | ICD-10-CM | POA: Diagnosis not present

## 2018-04-24 DIAGNOSIS — F988 Other specified behavioral and emotional disorders with onset usually occurring in childhood and adolescence: Secondary | ICD-10-CM | POA: Diagnosis not present

## 2018-04-24 DIAGNOSIS — F418 Other specified anxiety disorders: Secondary | ICD-10-CM | POA: Diagnosis present

## 2018-04-24 DIAGNOSIS — Z833 Family history of diabetes mellitus: Secondary | ICD-10-CM | POA: Diagnosis not present

## 2018-04-24 DIAGNOSIS — Z888 Allergy status to other drugs, medicaments and biological substances status: Secondary | ICD-10-CM | POA: Diagnosis not present

## 2018-04-24 DIAGNOSIS — J310 Chronic rhinitis: Secondary | ICD-10-CM | POA: Insufficient documentation

## 2018-04-24 DIAGNOSIS — Z885 Allergy status to narcotic agent status: Secondary | ICD-10-CM

## 2018-04-24 DIAGNOSIS — N809 Endometriosis, unspecified: Secondary | ICD-10-CM | POA: Diagnosis present

## 2018-04-24 DIAGNOSIS — F819 Developmental disorder of scholastic skills, unspecified: Secondary | ICD-10-CM | POA: Insufficient documentation

## 2018-04-24 DIAGNOSIS — R51 Headache: Secondary | ICD-10-CM | POA: Insufficient documentation

## 2018-04-24 DIAGNOSIS — Z8249 Family history of ischemic heart disease and other diseases of the circulatory system: Secondary | ICD-10-CM | POA: Diagnosis not present

## 2018-04-24 DIAGNOSIS — Z79891 Long term (current) use of opiate analgesic: Secondary | ICD-10-CM

## 2018-04-24 DIAGNOSIS — Z87891 Personal history of nicotine dependence: Secondary | ICD-10-CM

## 2018-04-24 HISTORY — DX: Other complications of anesthesia, initial encounter: T88.59XA

## 2018-04-24 HISTORY — DX: Adverse effect of unspecified anesthetic, initial encounter: T41.45XA

## 2018-04-24 HISTORY — DX: Spondylosis without myelopathy or radiculopathy, lumbosacral region: M47.817

## 2018-04-24 HISTORY — DX: Nausea with vomiting, unspecified: R11.2

## 2018-04-24 HISTORY — DX: Other specified postprocedural states: R11.2

## 2018-04-24 HISTORY — PX: TOTAL KNEE ARTHROPLASTY: SHX125

## 2018-04-24 HISTORY — DX: Other specified postprocedural states: Z98.890

## 2018-04-24 HISTORY — DX: Unilateral primary osteoarthritis, right knee: M17.11

## 2018-04-24 SURGERY — ARTHROPLASTY, KNEE, TOTAL
Anesthesia: Spinal | Site: Knee | Laterality: Right

## 2018-04-24 MED ORDER — KETOROLAC TROMETHAMINE 30 MG/ML IJ SOLN
30.0000 mg | Freq: Once | INTRAMUSCULAR | Status: AC | PRN
  Administered 2018-04-24: 30 mg via INTRAVENOUS

## 2018-04-24 MED ORDER — ROPIVACAINE HCL 5 MG/ML IJ SOLN
INTRAMUSCULAR | Status: DC | PRN
  Administered 2018-04-24 (×2): 5 mL via PERINEURAL

## 2018-04-24 MED ORDER — CEFAZOLIN SODIUM-DEXTROSE 2-4 GM/100ML-% IV SOLN
2.0000 g | INTRAVENOUS | Status: AC
  Administered 2018-04-24: 2 g via INTRAVENOUS

## 2018-04-24 MED ORDER — HYDROMORPHONE HCL 1 MG/ML IJ SOLN
INTRAMUSCULAR | Status: AC
  Filled 2018-04-24: qty 1

## 2018-04-24 MED ORDER — ACETAMINOPHEN 10 MG/ML IV SOLN: 1000.0000 mg | Freq: Once | INTRAVENOUS | Status: DC | PRN

## 2018-04-24 MED ORDER — CEFAZOLIN SODIUM-DEXTROSE 2-4 GM/100ML-% IV SOLN
2.0000 g | Freq: Four times a day (QID) | INTRAVENOUS | Status: AC
  Administered 2018-04-24 (×2): 2 g via INTRAVENOUS
  Filled 2018-04-24 (×2): qty 100

## 2018-04-24 MED ORDER — DEXAMETHASONE SODIUM PHOSPHATE 10 MG/ML IJ SOLN
10.0000 mg | Freq: Three times a day (TID) | INTRAMUSCULAR | Status: AC
  Administered 2018-04-24 – 2018-04-25 (×3): 10 mg via INTRAVENOUS
  Filled 2018-04-24 (×4): qty 1

## 2018-04-24 MED ORDER — SODIUM CHLORIDE 0.9 % IV SOLN
INTRAVENOUS | Status: DC | PRN
  Administered 2018-04-24: 35 ug/min via INTRAVENOUS

## 2018-04-24 MED ORDER — HYDROMORPHONE HCL 1 MG/ML IJ SOLN: 0.2500 mg | INTRAMUSCULAR | Status: DC | PRN

## 2018-04-24 MED ORDER — ONDANSETRON HCL 4 MG/2ML IJ SOLN
INTRAMUSCULAR | Status: AC
  Filled 2018-04-24: qty 2

## 2018-04-24 MED ORDER — KETOROLAC TROMETHAMINE 30 MG/ML IJ SOLN
INTRAMUSCULAR | Status: AC
  Filled 2018-04-24: qty 1

## 2018-04-24 MED ORDER — ONDANSETRON HCL 4 MG/2ML IJ SOLN
INTRAMUSCULAR | Status: DC | PRN
  Administered 2018-04-24: 4 mg via INTRAVENOUS

## 2018-04-24 MED ORDER — VENLAFAXINE HCL ER 150 MG PO CP24
150.0000 mg | ORAL_CAPSULE | Freq: Every day | ORAL | Status: DC
  Administered 2018-04-25 – 2018-04-26 (×2): 150 mg via ORAL
  Filled 2018-04-24 (×2): qty 1

## 2018-04-24 MED ORDER — ONDANSETRON HCL 4 MG PO TABS
4.0000 mg | ORAL_TABLET | Freq: Four times a day (QID) | ORAL | Status: DC | PRN
  Administered 2018-04-26: 4 mg via ORAL
  Filled 2018-04-24: qty 1

## 2018-04-24 MED ORDER — DEXAMETHASONE SODIUM PHOSPHATE 10 MG/ML IJ SOLN
INTRAMUSCULAR | Status: AC
  Filled 2018-04-24: qty 1

## 2018-04-24 MED ORDER — CHLORHEXIDINE GLUCONATE 4 % EX LIQD: 60.0000 mL | Freq: Once | CUTANEOUS | Status: DC

## 2018-04-24 MED ORDER — ALUM & MAG HYDROXIDE-SIMETH 200-200-20 MG/5ML PO SUSP: 30.0000 mL | ORAL | Status: DC | PRN

## 2018-04-24 MED ORDER — MENTHOL 3 MG MT LOZG: 1.0000 | LOZENGE | OROMUCOSAL | Status: DC | PRN

## 2018-04-24 MED ORDER — ONDANSETRON HCL 4 MG/2ML IJ SOLN
4.0000 mg | Freq: Four times a day (QID) | INTRAMUSCULAR | Status: DC | PRN
  Administered 2018-04-24 – 2018-04-25 (×2): 4 mg via INTRAVENOUS
  Filled 2018-04-24 (×2): qty 2

## 2018-04-24 MED ORDER — LACTATED RINGERS IV SOLN: INTRAVENOUS | Status: DC

## 2018-04-24 MED ORDER — ALPRAZOLAM 0.5 MG PO TABS
2.0000 mg | ORAL_TABLET | Freq: Every day | ORAL | Status: DC
  Administered 2018-04-24 – 2018-04-25 (×2): 2 mg via ORAL
  Filled 2018-04-24 (×2): qty 4

## 2018-04-24 MED ORDER — ACETAMINOPHEN 500 MG PO TABS
1000.0000 mg | ORAL_TABLET | Freq: Four times a day (QID) | ORAL | Status: AC
  Administered 2018-04-24 – 2018-04-25 (×4): 1000 mg via ORAL
  Filled 2018-04-24 (×4): qty 2

## 2018-04-24 MED ORDER — METOCLOPRAMIDE HCL 5 MG PO TABS
5.0000 mg | ORAL_TABLET | Freq: Three times a day (TID) | ORAL | Status: DC | PRN
  Administered 2018-04-24: 10 mg via ORAL
  Filled 2018-04-24: qty 2

## 2018-04-24 MED ORDER — LACTATED RINGERS IV SOLN
INTRAVENOUS | Status: DC | PRN
  Administered 2018-04-24 (×2): via INTRAVENOUS

## 2018-04-24 MED ORDER — MEPERIDINE HCL 50 MG/ML IJ SOLN: 6.2500 mg | INTRAMUSCULAR | Status: DC | PRN

## 2018-04-24 MED ORDER — SODIUM CHLORIDE (PF) 0.9 % IJ SOLN
INTRAMUSCULAR | Status: DC | PRN
  Administered 2018-04-24: 100 mL

## 2018-04-24 MED ORDER — PANTOPRAZOLE SODIUM 40 MG PO TBEC
40.0000 mg | DELAYED_RELEASE_TABLET | Freq: Every day | ORAL | Status: DC
  Administered 2018-04-25 – 2018-04-26 (×2): 40 mg via ORAL
  Filled 2018-04-24 (×3): qty 1

## 2018-04-24 MED ORDER — GABAPENTIN 300 MG PO CAPS
300.0000 mg | ORAL_CAPSULE | Freq: Every day | ORAL | Status: DC
  Administered 2018-04-24 – 2018-04-25 (×2): 300 mg via ORAL
  Filled 2018-04-24 (×2): qty 1

## 2018-04-24 MED ORDER — CEFAZOLIN SODIUM-DEXTROSE 2-4 GM/100ML-% IV SOLN
INTRAVENOUS | Status: AC
  Filled 2018-04-24: qty 100

## 2018-04-24 MED ORDER — BUPIVACAINE IN DEXTROSE 0.75-8.25 % IT SOLN
INTRATHECAL | Status: DC | PRN
  Administered 2018-04-24: 1.6 mL via INTRATHECAL

## 2018-04-24 MED ORDER — PROPOFOL 500 MG/50ML IV EMUL
INTRAVENOUS | Status: DC | PRN
  Administered 2018-04-24: 50 ug/kg/min via INTRAVENOUS

## 2018-04-24 MED ORDER — METOCLOPRAMIDE HCL 5 MG/ML IJ SOLN
5.0000 mg | Freq: Three times a day (TID) | INTRAMUSCULAR | Status: DC | PRN
  Administered 2018-04-24: 10 mg via INTRAVENOUS
  Filled 2018-04-24 (×2): qty 2

## 2018-04-24 MED ORDER — SODIUM CHLORIDE (PF) 0.9 % IJ SOLN: INTRAMUSCULAR | Status: DC | PRN

## 2018-04-24 MED ORDER — ASPIRIN EC 325 MG PO TBEC
325.0000 mg | DELAYED_RELEASE_TABLET | Freq: Every day | ORAL | Status: DC
  Administered 2018-04-25 – 2018-04-26 (×2): 325 mg via ORAL
  Filled 2018-04-24 (×2): qty 1

## 2018-04-24 MED ORDER — HYDROCODONE-ACETAMINOPHEN 7.5-325 MG PO TABS: 1.0000 | ORAL_TABLET | Freq: Once | ORAL | Status: DC | PRN

## 2018-04-24 MED ORDER — TRANEXAMIC ACID-NACL 1000-0.7 MG/100ML-% IV SOLN
1000.0000 mg | Freq: Once | INTRAVENOUS | Status: AC
  Administered 2018-04-24: 1000 mg via INTRAVENOUS
  Filled 2018-04-24: qty 100

## 2018-04-24 MED ORDER — SODIUM CHLORIDE 0.9 % IR SOLN
Status: DC | PRN
  Administered 2018-04-24: 3000 mL

## 2018-04-24 MED ORDER — POLYETHYLENE GLYCOL 3350 17 G PO PACK
17.0000 g | PACK | Freq: Two times a day (BID) | ORAL | Status: DC
  Administered 2018-04-24 – 2018-04-26 (×5): 17 g via ORAL
  Filled 2018-04-24 (×5): qty 1

## 2018-04-24 MED ORDER — HYDROMORPHONE HCL 1 MG/ML IJ SOLN
0.5000 mg | INTRAMUSCULAR | Status: DC | PRN
  Administered 2018-04-25 – 2018-04-26 (×3): 1 mg via INTRAVENOUS
  Filled 2018-04-24 (×5): qty 1

## 2018-04-24 MED ORDER — MEPERIDINE HCL 50 MG/ML IJ SOLN
6.2500 mg | INTRAMUSCULAR | Status: DC | PRN
  Administered 2018-04-24: 12.5 mg via INTRAVENOUS

## 2018-04-24 MED ORDER — HYDROMORPHONE HCL 1 MG/ML IJ SOLN
0.2500 mg | INTRAMUSCULAR | Status: DC | PRN
  Administered 2018-04-24 (×2): 0.5 mg via INTRAVENOUS

## 2018-04-24 MED ORDER — PROMETHAZINE HCL 25 MG/ML IJ SOLN: 6.2500 mg | INTRAMUSCULAR | Status: DC | PRN

## 2018-04-24 MED ORDER — DEXAMETHASONE SODIUM PHOSPHATE 10 MG/ML IJ SOLN
INTRAMUSCULAR | Status: DC | PRN
  Administered 2018-04-24: 10 mg via INTRAVENOUS

## 2018-04-24 MED ORDER — FENTANYL CITRATE (PF) 250 MCG/5ML IJ SOLN
INTRAMUSCULAR | Status: AC
  Filled 2018-04-24: qty 5

## 2018-04-24 MED ORDER — PROPOFOL 10 MG/ML IV BOLUS
INTRAVENOUS | Status: AC
  Filled 2018-04-24: qty 20

## 2018-04-24 MED ORDER — BUPIVACAINE-EPINEPHRINE 0.25% -1:200000 IJ SOLN
INTRAMUSCULAR | Status: AC
  Filled 2018-04-24: qty 1

## 2018-04-24 MED ORDER — ROPIVACAINE HCL 7.5 MG/ML IJ SOLN
INTRAMUSCULAR | Status: DC | PRN
  Administered 2018-04-24 (×4): 5 mL via PERINEURAL

## 2018-04-24 MED ORDER — POVIDONE-IODINE 7.5 % EX SOLN: Freq: Once | CUTANEOUS | Status: DC

## 2018-04-24 MED ORDER — MIDAZOLAM HCL 5 MG/5ML IJ SOLN
INTRAMUSCULAR | Status: DC | PRN
  Administered 2018-04-24: 2 mg via INTRAVENOUS

## 2018-04-24 MED ORDER — OXYCODONE HCL 5 MG PO TABS
ORAL_TABLET | ORAL | Status: AC
  Filled 2018-04-24: qty 2

## 2018-04-24 MED ORDER — OXYCODONE HCL 5 MG PO TABS
5.0000 mg | ORAL_TABLET | ORAL | Status: DC | PRN
  Administered 2018-04-24 – 2018-04-26 (×9): 10 mg via ORAL
  Filled 2018-04-24 (×8): qty 2

## 2018-04-24 MED ORDER — POTASSIUM CHLORIDE IN NACL 20-0.9 MEQ/L-% IV SOLN
INTRAVENOUS | Status: DC
  Administered 2018-04-24 (×2): via INTRAVENOUS
  Filled 2018-04-24 (×2): qty 1000

## 2018-04-24 MED ORDER — MEPERIDINE HCL 50 MG/ML IJ SOLN
INTRAMUSCULAR | Status: AC
  Filled 2018-04-24: qty 1

## 2018-04-24 MED ORDER — DOCUSATE SODIUM 100 MG PO CAPS
100.0000 mg | ORAL_CAPSULE | Freq: Two times a day (BID) | ORAL | Status: DC
  Administered 2018-04-24 – 2018-04-26 (×5): 100 mg via ORAL
  Filled 2018-04-24 (×5): qty 1

## 2018-04-24 MED ORDER — MIDAZOLAM HCL 2 MG/2ML IJ SOLN
INTRAMUSCULAR | Status: AC
  Filled 2018-04-24: qty 2

## 2018-04-24 MED ORDER — DIPHENHYDRAMINE HCL 12.5 MG/5ML PO ELIX
12.5000 mg | ORAL_SOLUTION | ORAL | Status: DC | PRN
  Administered 2018-04-24: 25 mg via ORAL
  Filled 2018-04-24: qty 10

## 2018-04-24 MED ORDER — FENTANYL CITRATE (PF) 100 MCG/2ML IJ SOLN
INTRAMUSCULAR | Status: DC | PRN
  Administered 2018-04-24: 100 ug via INTRAVENOUS

## 2018-04-24 MED ORDER — 0.9 % SODIUM CHLORIDE (POUR BTL) OPTIME
TOPICAL | Status: DC | PRN
  Administered 2018-04-24: 1000 mL

## 2018-04-24 MED ORDER — BUPIVACAINE LIPOSOME 1.3 % IJ SUSP
INTRAMUSCULAR | Status: DC | PRN
  Administered 2018-04-24: 20 mL

## 2018-04-24 MED ORDER — PHENOL 1.4 % MT LIQD: 1.0000 | OROMUCOSAL | Status: DC | PRN

## 2018-04-24 SURGICAL SUPPLY — 75 items
ATTUNE MED DOME PAT 32 KNEE (Knees) ×2 IMPLANT
ATTUNE MED DOME PAT 32MM KNEE (Knees) ×1 IMPLANT
ATTUNE PS FEM RT SZ 4 CEM KNEE (Femur) ×3 IMPLANT
ATTUNE PSRP INSR SZ4 5 KNEE (Insert) ×2 IMPLANT
ATTUNE PSRP INSR SZ4 5MM KNEE (Insert) ×1 IMPLANT
BANDAGE ESMARK 6X9 LF (GAUZE/BANDAGES/DRESSINGS) ×1 IMPLANT
BASEPLATE TIBIAL ROTATING SZ 4 (Knees) ×3 IMPLANT
BENZOIN TINCTURE PRP APPL 2/3 (GAUZE/BANDAGES/DRESSINGS) ×3 IMPLANT
BLADE SAGITTAL 25.0X1.19X90 (BLADE) ×2 IMPLANT
BLADE SAGITTAL 25.0X1.19X90MM (BLADE) ×1
BLADE SAW SGTL 13X75X1.27 (BLADE) ×3 IMPLANT
BLADE SURG 10 STRL SS (BLADE) ×6 IMPLANT
BNDG ELASTIC 6X15 VLCR STRL LF (GAUZE/BANDAGES/DRESSINGS) ×3 IMPLANT
BNDG ESMARK 6X9 LF (GAUZE/BANDAGES/DRESSINGS) ×3
BOWL SMART MIX CTS (DISPOSABLE) ×3 IMPLANT
CEMENT HV SMART SET (Cement) ×3 IMPLANT
CLOSURE WOUND 1/2 X4 (GAUZE/BANDAGES/DRESSINGS) ×1
COVER SURGICAL LIGHT HANDLE (MISCELLANEOUS) ×3 IMPLANT
COVER WAND RF STERILE (DRAPES) ×3 IMPLANT
CUFF TOURNIQUET SINGLE 34IN LL (TOURNIQUET CUFF) ×3 IMPLANT
CUFF TOURNIQUET SINGLE 44IN (TOURNIQUET CUFF) IMPLANT
DECANTER SPIKE VIAL GLASS SM (MISCELLANEOUS) ×3 IMPLANT
DRAPE EXTREMITY T 121X128X90 (DRAPE) ×3 IMPLANT
DRAPE HALF SHEET 40X57 (DRAPES) ×6 IMPLANT
DRAPE INCISE IOBAN 66X45 STRL (DRAPES) IMPLANT
DRAPE ORTHO SPLIT 77X108 STRL (DRAPES) ×2
DRAPE SURG ORHT 6 SPLT 77X108 (DRAPES) ×1 IMPLANT
DRAPE U-SHAPE 47X51 STRL (DRAPES) ×3 IMPLANT
DRSG AQUACEL AG ADV 3.5X10 (GAUZE/BANDAGES/DRESSINGS) ×3 IMPLANT
DURAPREP 26ML APPLICATOR (WOUND CARE) ×3 IMPLANT
ELECT CAUTERY BLADE 6.4 (BLADE) ×3 IMPLANT
ELECT REM PT RETURN 9FT ADLT (ELECTROSURGICAL) ×3
ELECTRODE REM PT RTRN 9FT ADLT (ELECTROSURGICAL) ×1 IMPLANT
FACESHIELD WRAPAROUND (MASK) ×3 IMPLANT
GLOVE BIO SURGEON STRL SZ7 (GLOVE) ×3 IMPLANT
GLOVE BIOGEL PI IND STRL 7.0 (GLOVE) ×1 IMPLANT
GLOVE BIOGEL PI IND STRL 7.5 (GLOVE) ×1 IMPLANT
GLOVE BIOGEL PI INDICATOR 7.0 (GLOVE) ×2
GLOVE BIOGEL PI INDICATOR 7.5 (GLOVE) ×2
GLOVE SS BIOGEL STRL SZ 7.5 (GLOVE) ×1 IMPLANT
GLOVE SUPERSENSE BIOGEL SZ 7.5 (GLOVE) ×2
GOWN STRL REUS W/ TWL LRG LVL3 (GOWN DISPOSABLE) ×1 IMPLANT
GOWN STRL REUS W/ TWL XL LVL3 (GOWN DISPOSABLE) ×1 IMPLANT
GOWN STRL REUS W/TWL LRG LVL3 (GOWN DISPOSABLE) ×2
GOWN STRL REUS W/TWL XL LVL3 (GOWN DISPOSABLE) ×2
HANDPIECE INTERPULSE COAX TIP (DISPOSABLE) ×2
HOOD PEEL AWAY FACE SHEILD DIS (HOOD) ×6 IMPLANT
IMMOBILIZER KNEE 22 UNIV (SOFTGOODS) ×3 IMPLANT
KIT BASIN OR (CUSTOM PROCEDURE TRAY) ×3 IMPLANT
KIT TURNOVER KIT B (KITS) ×3 IMPLANT
MANIFOLD NEPTUNE II (INSTRUMENTS) ×3 IMPLANT
MARKER SKIN DUAL TIP RULER LAB (MISCELLANEOUS) ×3 IMPLANT
NEEDLE HYPO 22GX1.5 SAFETY (NEEDLE) ×6 IMPLANT
NS IRRIG 1000ML POUR BTL (IV SOLUTION) ×3 IMPLANT
PACK TOTAL JOINT (CUSTOM PROCEDURE TRAY) ×3 IMPLANT
PAD ARMBOARD 7.5X6 YLW CONV (MISCELLANEOUS) ×6 IMPLANT
PIN STEINMAN FIXATION KNEE (PIN) ×3 IMPLANT
PIN THREADED HEADED SIGMA (PIN) ×3 IMPLANT
SET HNDPC FAN SPRY TIP SCT (DISPOSABLE) ×1 IMPLANT
STRIP CLOSURE SKIN 1/2X4 (GAUZE/BANDAGES/DRESSINGS) ×2 IMPLANT
SUCTION FRAZIER HANDLE 10FR (MISCELLANEOUS) ×2
SUCTION TUBE FRAZIER 10FR DISP (MISCELLANEOUS) ×1 IMPLANT
SUT MNCRL AB 3-0 PS2 18 (SUTURE) ×3 IMPLANT
SUT VIC AB 0 CT1 27 (SUTURE) ×4
SUT VIC AB 0 CT1 27XBRD ANBCTR (SUTURE) ×2 IMPLANT
SUT VIC AB 1 CT1 27 (SUTURE) ×2
SUT VIC AB 1 CT1 27XBRD ANBCTR (SUTURE) ×1 IMPLANT
SUT VIC AB 2-0 CT1 27 (SUTURE) ×4
SUT VIC AB 2-0 CT1 TAPERPNT 27 (SUTURE) ×2 IMPLANT
SYR CONTROL 10ML LL (SYRINGE) ×6 IMPLANT
TOWEL OR 17X24 6PK STRL BLUE (TOWEL DISPOSABLE) ×3 IMPLANT
TOWEL OR 17X26 10 PK STRL BLUE (TOWEL DISPOSABLE) ×3 IMPLANT
TRAY CATH 16FR W/PLASTIC CATH (SET/KITS/TRAYS/PACK) IMPLANT
TRAY FOLEY CATH SILVER 16FR (SET/KITS/TRAYS/PACK) ×3 IMPLANT
WATER STERILE IRR 1000ML POUR (IV SOLUTION) ×3 IMPLANT

## 2018-04-24 NOTE — Plan of Care (Signed)
  Problem: Activity: Goal: Ability to avoid complications of mobility impairment will improve Outcome: Progressing Goal: Range of joint motion will improve Outcome: Progressing   Problem: Pain Management: Goal: Pain level will decrease with appropriate interventions Outcome: Progressing   

## 2018-04-24 NOTE — Anesthesia Postprocedure Evaluation (Signed)
Anesthesia Post Note  Patient: Julia Zimmerman  Procedure(s) Performed: TOTAL KNEE ARTHROPLASTY (Right Knee)     Patient location during evaluation: PACU Anesthesia Type: Spinal Level of consciousness: awake Pain management: pain level controlled Vital Signs Assessment: post-procedure vital signs reviewed and stable Respiratory status: spontaneous breathing Cardiovascular status: stable Postop Assessment: no headache, no backache, spinal receding, patient able to bend at knees and no apparent nausea or vomiting Anesthetic complications: no    Last Vitals:  Vitals:   04/24/18 0935 04/24/18 0950  BP: 109/77 131/76  Pulse:    Resp: (!) 26 17  Temp:    SpO2:      Last Pain:  Vitals:   04/24/18 0945  TempSrc:   PainSc: 5    Pain Goal:                 Huston Foley

## 2018-04-24 NOTE — Progress Notes (Signed)
Orthopedic Tech Progress Note Patient Details:  Julia Zimmerman Apr 22, 1951 [id number]  CPM Right Knee CPM Right Knee: On Right Knee Flexion (Degrees): 90 Right Knee Extension (Degrees): 0 Additional Comments: foot roll  Post Interventions Patient Tolerated: Well Instructions Provided: Care of device  [place] Pink 04/24/2018, 10:15 AM

## 2018-04-24 NOTE — Anesthesia Procedure Notes (Signed)
Anesthesia Regional Block: Adductor canal block   Pre-Anesthetic Checklist: ,, timeout performed, Correct Patient, Correct Site, Correct Laterality, Correct Procedure, Correct Position, site marked, Risks and benefits discussed,  Surgical consent,  Pre-op evaluation,  At surgeon's request and post-op pain management  Laterality: Lower and Right  Prep: chloraprep       Needles:  Injection technique: Single-shot  Needle Type: Echogenic Stimulator Needle     Needle Length: 10cm  Needle Gauge: 21   Needle insertion depth: 2.5 cm   Additional Needles:   Procedures:,,,, ultrasound used (permanent image in chart),,,,  Narrative:  Start time: 04/24/2018 6:40 AM End time: 04/24/2018 6:50 AM Injection made incrementally with aspirations every 5 mL.  Performed by: Personally  Anesthesiologist: Lyn Hollingshead, MD

## 2018-04-24 NOTE — Anesthesia Procedure Notes (Signed)
Spinal  Patient location during procedure: OR Start time: 04/24/2018 7:19 AM End time: 04/24/2018 7:22 AM Staffing Anesthesiologist: Lyn Hollingshead, MD Performed: anesthesiologist  Preanesthetic Checklist Completed: patient identified, site marked, surgical consent, pre-op evaluation, timeout performed, IV checked, risks and benefits discussed and monitors and equipment checked Spinal Block Patient position: sitting Prep: site prepped and draped and DuraPrep Patient monitoring: continuous pulse ox and blood pressure Approach: midline Location: L3-4 Injection technique: single-shot Needle Needle type: Pencan  Needle gauge: 24 G Needle length: 10 cm Needle insertion depth: 6 cm Assessment Sensory level: T8

## 2018-04-24 NOTE — Evaluation (Signed)
Physical Therapy Evaluation Patient Details Name: Julia Zimmerman MRN: [id number] DOB: [birth year] Today's Date: 04/24/2018   History of Present Illness  67 y.o. female admitted on 04/24/18 for elective R TKA.  Pt with significant PMH of lumbosacral facet joint syndrome, essential HTN, ADD, HA, L meniscus tear s/p surgery.   Clinical Impression  Pt is POD #0 and is moving well.  She ambulated a short distance around the room with RW and min assist overall for all mobility.  She has arranged 24/7 friend support at discharge and anticipates to d/c home with home therapy follow up at discharge.  PT will continue to follow acutely for safe mobility progression     Follow Up Recommendations Home health PT;Supervision for mobility/OOB    Equipment Recommendations  None recommended by PT    Recommendations for Other Services   NA    Precautions / Restrictions Precautions Precautions: Knee Precaution Booklet Issued: Yes (comment) Precaution Comments: knee exercise handout given, review initiated Restrictions Weight Bearing Restrictions: Yes RLE Weight Bearing: Weight bearing as tolerated      Mobility  Bed Mobility Overal bed mobility: Needs Assistance Bed Mobility: Supine to Sit     Supine to sit: Min assist;HOB elevated     General bed mobility comments: Min assist to help progress right leg to EOB.  Pt using bed rail to support trunk to pull up to sitting EOB.   Transfers Overall transfer level: Needs assistance   Transfers: Sit to/from Stand Sit to Stand: Min assist         General transfer comment: Min assist to stand from bed, cues for safe hand placement.    Ambulation/Gait Ambulation/Gait assistance: Min assist Gait Distance (Feet): 10 Feet Assistive device: Rolling walker (2 wheeled) Gait Pattern/deviations: Step-to pattern;Antalgic     General Gait Details: Pt with moderately antalgic gait pattern, but able to mobilize a short distance around the room  with RW and one person min assist for balance and safety.           Balance Overall balance assessment: Needs assistance Sitting-balance support: Feet supported;No upper extremity supported Sitting balance-Leahy Scale: Good     Standing balance support: Bilateral upper extremity supported Standing balance-Leahy Scale: Poor Standing balance comment: needs external support from RW and therapist.                             Pertinent Vitals/Pain Pain Assessment: 0-10 Pain Score: 7  Pain Location: R knee Pain Descriptors / Indicators: Grimacing;Guarding Pain Intervention(s): Limited activity within patient's tolerance;Monitored during session;Repositioned;Ice applied;Patient requesting pain meds-RN notified    Home Living Family/patient expects to be discharged to:: Private residence Living Arrangements: Non-relatives/Friends(24/7 for 2 weeks) Available Help at Discharge: Friend(s);Available 24 hours/day Type of Home: House Home Access: Stairs to enter Entrance Stairs-Rails: None Entrance Stairs-Number of Steps: 1 Home Layout: Multi-level(split level) Home Equipment: Walker - 2 wheels;Bedside commode;Grab bars - tub/shower;Other (comment)(CPM machine)      Prior Function Level of Independence: Independent with assistive device(s)         Comments: At times had to use crutch at night to get to the bathroom.      Hand Dominance   Dominant Hand: Right    Extremity/Trunk Assessment   Upper Extremity Assessment Upper Extremity Assessment: Overall WFL for tasks assessed    Lower Extremity Assessment Lower Extremity Assessment: RLE deficits/detail RLE Deficits / Details: right leg with normal post op pain and  weakness, ankle 3/5, knee 2/5, hip flexion 3-/5 RLE Sensation: WNL    Cervical / Trunk Assessment Cervical / Trunk Assessment: Other exceptions Cervical / Trunk Exceptions: h/o low back pain  Communication   Communication: No difficulties   Cognition Arousal/Alertness: Awake/alert Behavior During Therapy: WFL for tasks assessed/performed Overall Cognitive Status: Within Functional Limits for tasks assessed                                           Exercises Total Joint Exercises Ankle Circles/Pumps: AROM;Both;20 reps Quad Sets: AROM;Right;10 reps Towel Squeeze: AROM;Both;10 reps Heel Slides: AAROM;Right;10 reps   Assessment/Plan    PT Assessment Patient needs continued PT services  PT Problem List Decreased strength;Decreased range of motion;Decreased activity tolerance;Decreased balance;Decreased mobility;Decreased cognition;Decreased knowledge of precautions;Pain       PT Treatment Interventions DME instruction;Gait training;Stair training;Functional mobility training;Therapeutic activities;Therapeutic exercise;Balance training;Patient/family education;Manual techniques;Modalities    PT Goals (Current goals can be found in the Care Plan section)  Acute Rehab PT Goals Patient Stated Goal: to get back to gardening PT Goal Formulation: With patient Time For Goal Achievement: 05/01/18 Potential to Achieve Goals: Good    Frequency 7X/week           AM-PAC PT "6 Clicks" Mobility  Outcome Measure Help needed turning from your back to your side while in a flat bed without using bedrails?: A Little Help needed moving from lying on your back to sitting on the side of a flat bed without using bedrails?: A Little Help needed moving to and from a bed to a chair (including a wheelchair)?: A Little Help needed standing up from a chair using your arms (e.g., wheelchair or bedside chair)?: A Little Help needed to walk in hospital room?: A Little Help needed climbing 3-5 steps with a railing? : A Little 6 Click Score: 18    End of Session Equipment Utilized During Treatment: Gait belt Activity Tolerance: Patient limited by pain Patient left: in chair;with call bell/phone within reach;with  family/visitor present Nurse Communication: Mobility status;Patient requests pain meds PT Visit Diagnosis: Muscle weakness (generalized) (M62.81);Difficulty in walking, not elsewhere classified (R26.2);Pain Pain - Right/Left: Right Pain - part of body: Knee    Time: 3295-1884 PT Time Calculation (min) (ACUTE ONLY): 26 min   Charges:         Lurena Joiner B. Zhaniya Swallows, PT, DPT  Acute Rehabilitation #[phone] pager #[phone] office   PT Evaluation $PT Eval Moderate Complexity: 1 Mod PT Treatments $Therapeutic Activity: 8-22 mins       04/24/2018, 3:06 PM

## 2018-04-24 NOTE — Anesthesia Procedure Notes (Signed)
Procedure Name: MAC Date/Time: 04/24/2018 7:25 AM Performed by: Neldon Newport, CRNA Pre-anesthesia Checklist: Patient identified, Emergency Drugs available, Suction available, Patient being monitored and Timeout performed Patient Re-evaluated:Patient Re-evaluated prior to induction Oxygen Delivery Method: Simple face mask Placement Confirmation: positive ETCO2 Dental Injury: Teeth and Oropharynx as per pre-operative assessment

## 2018-04-24 NOTE — Transfer of Care (Signed)
Immediate Anesthesia Transfer of Care Note  Patient: Julia Zimmerman  Procedure(s) Performed: TOTAL KNEE ARTHROPLASTY (Right Knee)  Patient Location: PACU  Anesthesia Type:General  Level of Consciousness: awake, alert  and oriented  Airway & Oxygen Therapy: Patient Spontanous Breathing and Patient connected to nasal cannula oxygen  Post-op Assessment: Report given to RN and Post -op Vital signs reviewed and stable  Post vital signs: Reviewed and stable  Last Vitals:  Vitals Value Taken Time  BP 109/73 04/24/2018  9:20 AM  Temp    Pulse 92 04/24/2018  9:20 AM  Resp 23 04/24/2018  9:20 AM  SpO2 95 % 04/24/2018  9:20 AM  Vitals shown include unvalidated device data.  Last Pain:  Vitals:   04/24/18 0614  TempSrc:   PainSc: 0-No pain         Complications: No apparent anesthesia complications

## 2018-04-24 NOTE — Interval H&P Note (Signed)
History and Physical Interval Note:  04/24/2018 6:32 AM  Julia Zimmerman  has presented today for surgery, with the diagnosis of djd right knee  The various methods of treatment have been discussed with the patient and family. After consideration of risks, benefits and other options for treatment, the patient has consented to  Procedure(s): TOTAL KNEE ARTHROPLASTY (Right) as a surgical intervention .  The patient's history has been reviewed, patient examined, no change in status, stable for surgery.  I have reviewed the patient's chart and labs.  Questions were answered to the patient's satisfaction.     Lorn Junes

## 2018-04-24 NOTE — Op Note (Signed)
MRN:     [id number] DOB/AGE:    [birth year] / 67 y.o.       OPERATIVE REPORT   DATE OF PROCEDURE:  04/24/2018      PREOPERATIVE DIAGNOSIS:   Primary Localized Osteoarthritis right Knee       Estimated body mass index is 30.18 kg/m as calculated from the following:   Height as of this encounter: 5\' 2"  (1.575 m).   Weight as of this encounter: 74.8 kg.                                                       POSTOPERATIVE DIAGNOSIS:   Same                                                                 PROCEDURE:  Procedure(s): TOTAL KNEE ARTHROPLASTY Using Depuy Attune RP implants #4 Femur, #4Tibia, 5mm  RP bearing, 32 Patella    SURGEON: Alicianna Litchford A. Thurston Hole, MD   ASSISTANT: Julien Girt, PA-C, present and scrubbed throughout the case, critical for retraction, instrumentation, and closure.  ANESTHESIA: Spinal with Adductor Nerve Block  TOURNIQUET TIME: 58 minutes   COMPLICATIONS:  None       SPECIMENS: None   INDICATIONS FOR PROCEDURE: The patient has djd of the knee with varus deformities, XR shows bone on bone arthritis. Patient has failed all conservative measures including anti-inflammatory medicines, narcotics, attempts at exercise and weight loss, cortisone injections and viscosupplementation.  Risks and benefits of surgery have been discussed, questions answered.    DESCRIPTION OF PROCEDURE: The patient identified by armband, received right adductor canal block and IV antibiotics, in the holding area at [facility]. Patient taken to the operating room, appropriate anesthetic monitors were attached. Spinal anesthesia induced with the patient in supine position, Foley catheter was inserted. Tourniquet applied high to the operative thigh. Lateral post and foot positioner applied to the table, the lower extremity was then prepped and draped in usual sterile fashion from the ankle to the tourniquet. Time-out procedure was performed. The limb was wrapped with an Esmarch  bandage and the tourniquet inflated to 365 mmHg.   We began the operation by making a 6cm anterior midline incision. Small bleeders in the skin and the subcutaneous tissue identified and cauterized. Transverse retinaculum was incised and reflected medially and a medial parapatellar arthrotomy was accomplished. the patella was everted and theprepatellar fat pad resected. The superficial medial collateral ligament was then elevated from anterior to posterior along the proximal flare of the tibia and anterior half of the menisci resected. The knee was hyperflexed exposing bone on bone arthritis. Peripheral and notch osteophytes as well as the cruciate ligaments were then resected. We continued to work our way around posteriorly along the proximal tibia, and externally rotated the tibia subluxing it out from underneath the femur. A McHale retractor was placed through the notch and a lateral Hohmann retractor placed, and an external tibial guide was placed.  The tibial cutting guide was pinned into place allowing resection of 4 mm of bone medially and about 6 mm of bone laterally because of her  varus deformity.   Satisfied with the tibial resection, we then entered the distal femur 2 mm anterior to the PCL origin with the intramedullary guide rod and applied the distal femoral cutting guide set at 11mm, with 5 degrees of valgus. This was pinned along the epicondylar axis. At this point, the distal femoral cut was accomplished without difficulty. We then sized for a 4 femoral component and pinned the guide in 3 degrees of external rotation.The chamfer cutting guide was pinned into place. The anterior, posterior, and chamfer cuts were accomplished without difficulty followed by the  RP box cutting guide and the box cut. We also removed posterior osteophytes from the posterior femoral condyles. At this time, the knee was brought into full extension. We checked our extension and flexion gaps and found them symmetric at  5.  The patella thickness measured at 84m m. We set the cutting guide at 13 and removed the posterior patella sized for 32 button and drilled the lollipop. The knee was then once again hyperflexed exposing the proximal tibia. We sized for a # 4 tibial base plate, applied the smokestack and the conical reamer followed by the the Delta fin keel punch. We then hammered into place the  RP trial femoral component, inserted a trial bearing, trial patellar button, and took the knee through range of motion from 0-130 degrees. No thumb pressure was required for patellar tracking.   At this point, all trial components were removed, a double batch of DePuy HV cement  was mixed and applied to all bony metallic mating surfaces. In order, we hammered into place the tibial tray and removed excess cement, the femoral component and removed excess cement, a 5 mm  RP bearing was inserted, and the knee brought to full extension with compression. The patellar button was clamped into place, and excess cement removed. While the cement cured the wound was irrigated out with normal saline solution pulse lavage, and exparel was injected throughout the knee. Ligament stability and patellar tracking were checked and found to be excellent..   The parapatellar arthrotomy was closed with  #1 Vicryl suture. The subcutaneous tissue with 0 and 2-0 undyed Vicryl suture, and 4-0 Monocryl.. A dressing of Aquaseal, 4 x 4, dressing sponges, Webril, and Ace wrap applied. Needle and sponge count were correct times 2.The patient awakened, extubated, and taken to recovery room without difficulty. Vascular status was normal, pulses 2+ and symmetric.    Nilda Simmer 07/25/2017, 8:56 AM

## 2018-04-25 ENCOUNTER — Encounter (HOSPITAL_COMMUNITY): Payer: Self-pay | Admitting: Orthopedic Surgery

## 2018-04-25 DIAGNOSIS — K219 Gastro-esophageal reflux disease without esophagitis: Secondary | ICD-10-CM | POA: Diagnosis not present

## 2018-04-25 DIAGNOSIS — I10 Essential (primary) hypertension: Secondary | ICD-10-CM | POA: Diagnosis not present

## 2018-04-25 DIAGNOSIS — D62 Acute posthemorrhagic anemia: Secondary | ICD-10-CM | POA: Diagnosis not present

## 2018-04-25 DIAGNOSIS — E785 Hyperlipidemia, unspecified: Secondary | ICD-10-CM | POA: Diagnosis not present

## 2018-04-25 DIAGNOSIS — F988 Other specified behavioral and emotional disorders with onset usually occurring in childhood and adolescence: Secondary | ICD-10-CM | POA: Diagnosis not present

## 2018-04-25 DIAGNOSIS — Z79899 Other long term (current) drug therapy: Secondary | ICD-10-CM | POA: Diagnosis not present

## 2018-04-25 DIAGNOSIS — E669 Obesity, unspecified: Secondary | ICD-10-CM | POA: Diagnosis not present

## 2018-04-25 DIAGNOSIS — M1711 Unilateral primary osteoarthritis, right knee: Secondary | ICD-10-CM | POA: Diagnosis not present

## 2018-04-25 DIAGNOSIS — R51 Headache: Secondary | ICD-10-CM | POA: Diagnosis not present

## 2018-04-25 DIAGNOSIS — J310 Chronic rhinitis: Secondary | ICD-10-CM | POA: Diagnosis not present

## 2018-04-25 DIAGNOSIS — F819 Developmental disorder of scholastic skills, unspecified: Secondary | ICD-10-CM | POA: Diagnosis not present

## 2018-04-25 DIAGNOSIS — F418 Other specified anxiety disorders: Secondary | ICD-10-CM | POA: Diagnosis not present

## 2018-04-25 DIAGNOSIS — Z87891 Personal history of nicotine dependence: Secondary | ICD-10-CM | POA: Diagnosis not present

## 2018-04-25 DIAGNOSIS — Z6830 Body mass index (BMI) 30.0-30.9, adult: Secondary | ICD-10-CM | POA: Diagnosis not present

## 2018-04-25 LAB — BASIC METABOLIC PANEL
Anion gap: 10 (ref 5–15)
BUN: 9 mg/dL (ref 8–23)
CHLORIDE: 104 mmol/L (ref 98–111)
CO2: 24 mmol/L (ref 22–32)
Calcium: 8.8 mg/dL — ABNORMAL LOW (ref 8.9–10.3)
Creatinine, Ser: 0.72 mg/dL (ref 0.44–1.00)
GFR calc Af Amer: >60 mL/min (ref >60)
GFR calc non Af Amer: >60 mL/min (ref >60)
Glucose, Bld: 126 mg/dL — ABNORMAL HIGH (ref 70–99)
Potassium: 4.5 mmol/L (ref 3.5–5.1)
Sodium: 138 mmol/L (ref 135–145)

## 2018-04-25 LAB — CBC
HCT: 36.8 % (ref 36.0–46.0)
Hemoglobin: 12 g/dL (ref 12.0–15.0)
MCH: 29.5 pg (ref 26.0–34.0)
MCHC: 32.6 g/dL (ref 30.0–36.0)
MCV: 90.4 fL (ref 80.0–100.0)
Platelets: 278 10*3/uL (ref 150–400)
RBC: 4.07 MIL/uL (ref 3.87–5.11)
RDW: 12.5 % (ref 11.5–15.5)
WBC: 12.8 10*3/uL — ABNORMAL HIGH (ref 4.0–10.5)
nRBC: 0 % (ref 0.0–0.2)

## 2018-04-25 MED ORDER — ONDANSETRON HCL 4 MG PO TABS: 4.0000 mg | ORAL_TABLET | Freq: Four times a day (QID) | ORAL | 0 refills | Status: DC | PRN

## 2018-04-25 MED ORDER — POLYETHYLENE GLYCOL 3350 17 G PO PACK: PACK | ORAL | 0 refills | Status: DC

## 2018-04-25 MED ORDER — OXYCODONE HCL 5 MG PO TABS: ORAL_TABLET | ORAL | 0 refills | Status: DC

## 2018-04-25 MED ORDER — DOCUSATE SODIUM 100 MG PO CAPS: ORAL_CAPSULE | ORAL | 0 refills | Status: DC

## 2018-04-25 MED ORDER — ASPIRIN 325 MG PO TBEC: DELAYED_RELEASE_TABLET | ORAL | 0 refills | Status: DC

## 2018-04-25 MED ORDER — GABAPENTIN 300 MG PO CAPS: ORAL_CAPSULE | ORAL | 0 refills | Status: DC

## 2018-04-25 NOTE — Plan of Care (Signed)
  Problem: Education: Goal: Knowledge of the prescribed therapeutic regimen will improve Outcome: Progressing   Problem: Activity: Goal: Ability to avoid complications of mobility impairment will improve Outcome: Progressing Goal: Range of joint motion will improve Outcome: Progressing   

## 2018-04-25 NOTE — Progress Notes (Signed)
Physical Therapy Treatment Patient Details Name: Julia Zimmerman MRN: [id number] DOB: [birth year] Today's Date: 04/25/2018    History of Present Illness 67 y.o. female admitted on 04/24/18 for elective R TKA.  Pt with significant PMH of lumbosacral facet joint syndrome, essential HTN, ADD, HA, L meniscus tear s/p surgery.     PT Comments    Pt sitting up with R LE in Bone Foam on entry, agreeable to getting up to ambulate with therapy. Pt limited in safe mobility by increased R LE pain and decreased ROM and strength. Pt currently requires min guard for transfers and ambulation of 350 feet with RW. Once in room pt able to complete seated LE exercises. Pt plans for d/c home tomorrow.    Follow Up Recommendations  Home health PT;Supervision for mobility/OOB     Equipment Recommendations  None recommended by PT       Precautions / Restrictions Precautions Precautions: Knee Precaution Booklet Issued: Yes (comment) Precaution Comments: knee exercise handout given, review initiated Restrictions Weight Bearing Restrictions: Yes RLE Weight Bearing: Weight bearing as tolerated    Mobility  Bed Mobility               General bed mobility comments: OOB in recliner with R LE in Bone Foam  Transfers Overall transfer level: Needs assistance Equipment used: Rolling walker (2 wheeled) Transfers: Sit to/from Stand Sit to Stand: Min guard         General transfer comment: min guard for safety, pood hand placement and power up before reaching to RW  Ambulation/Gait Ambulation/Gait assistance: Min guard Gait Distance (Feet): 350 Feet Assistive device: Rolling walker (2 wheeled) Gait Pattern/deviations: Antalgic;Step-through pattern;Decreased stance time - left;Decreased step length - right;Decreased weight shift to right;Shuffle Gait velocity: slowed Gait velocity interpretation: <1.31 ft/sec, indicative of household ambulator General Gait Details: Pt continues to have antalgic  gait with decreased seing through on L LE, vc for increased R LE extension and UE use to advance LLE       Balance Overall balance assessment: Needs assistance Sitting-balance support: Feet supported;No upper extremity supported Sitting balance-Leahy Scale: Good     Standing balance support: Bilateral upper extremity supported Standing balance-Leahy Scale: Poor Standing balance comment: needs external support from RW and therapist.                            Cognition Arousal/Alertness: Awake/alert Behavior During Therapy: WFL for tasks assessed/performed Overall Cognitive Status: Within Functional Limits for tasks assessed                                        Exercises Total Joint Exercises Ankle Circles/Pumps: AROM;Both;20 reps Quad Sets: AROM;Right;5 reps;Seated Long Arc Quad: AROM;Right;10 reps Knee Flexion: AROM;AAROM;Right;10 reps;Seated Goniometric ROM: 5 to 83 degrees        Pertinent Vitals/Pain Pain Assessment: 0-10 Pain Score: 4  Pain Location: R knee Pain Descriptors / Indicators: Grimacing;Guarding Pain Intervention(s): Premedicated before session;Monitored during session;Limited activity within patient's tolerance;Repositioned           PT Goals (current goals can now be found in the care plan section) Acute Rehab PT Goals Patient Stated Goal: to get back to gardening PT Goal Formulation: With patient Time For Goal Achievement: 05/01/18 Potential to Achieve Goals: Good    Frequency    7X/week      PT  Plan Current plan remains appropriate       AM-PAC PT "6 Clicks" Mobility   Outcome Measure  Help needed turning from your back to your side while in a flat bed without using bedrails?: A Little Help needed moving from lying on your back to sitting on the side of a flat bed without using bedrails?: A Little Help needed moving to and from a bed to a chair (including a wheelchair)?: A Little Help needed standing  up from a chair using your arms (e.g., wheelchair or bedside chair)?: A Little Help needed to walk in hospital room?: A Little Help needed climbing 3-5 steps with a railing? : A Little 6 Click Score: 18    End of Session Equipment Utilized During Treatment: Gait belt Activity Tolerance: Patient limited by pain Patient left: in chair;with call bell/phone within reach;with family/visitor present Nurse Communication: Mobility status;Patient requests pain meds PT Visit Diagnosis: Muscle weakness (generalized) (M62.81);Difficulty in walking, not elsewhere classified (R26.2);Pain Pain - Right/Left: Right Pain - part of body: Knee     Time: 1238-1300 PT Time Calculation (min) (ACUTE ONLY): 22 min  Charges:  $Gait Training: 8-22 mins                     Ramere Downs B. Beverely Risen PT, DPT Acute Rehabilitation Services Pager [phone] Office [phone]    Elon Alas Fleet 04/25/2018, 3:01 PM

## 2018-04-25 NOTE — Progress Notes (Signed)
Subjective: 1 Day Post-Op Procedure(s) (LRB): TOTAL KNEE ARTHROPLASTY (Right) Patient reports pain as 7 on 0-10 scale.    Objective: Vital signs in last 24 hours: Temp:  [97 F (36.1 C)-98.6 F (37 C)] 97 F (36.1 C) (12/24 0306) Pulse Rate:  [71-97] 87 (12/24 0306) Resp:  [14-26] 17 (12/24 0306) BP: (106-141)/(68-84) 114/68 (12/24 0306) SpO2:  [90 %-96 %] 93 % (12/24 0306)  Intake/Output from previous day: 12/23 0701 - 12/24 0700 In: 2566 [P.O.:680; I.V.:1486; IV Piggyback:400] Out: 450 [Urine:400; Blood:50] Intake/Output this shift: No intake/output data recorded.  Recent Labs    04/25/18 0338  HGB 12.0   Recent Labs    04/25/18 0338  WBC 12.8*  RBC 4.07  HCT 36.8  PLT 278   Recent Labs    04/25/18 0338  NA 138  K 4.5  CL 104  CO2 24  BUN 9  CREATININE 0.72  GLUCOSE 126*  CALCIUM 8.8*   No results for input(s): LABPT, INR in the last 72 hours.  ABD soft Neurovascular intact Sensation intact distally Intact pulses distally Incision: dressing C/D/I  Anticipated LOS equal to or greater than 2 midnights due to - Age 65 and older with one or more of the following:  - Obesity  - Expected need for hospital services (PT, OT, Nursing) required for safe  discharge  - Anticipated need for postoperative skilled nursing care or inpatient rehab  - Active co-morbidities: None OR   - Unanticipated findings during/Post Surgery: Slow post-op progression: GI, pain control, mobility  - Patient is a high risk of re-admission due to: None   Assessment/Plan: 1 Day Post-Op Procedure(s) (LRB): TOTAL KNEE ARTHROPLASTY (Right)  Principal Problem:   Primary localized osteoarthritis of right knee Active Problems:   ADD (attention deficit disorder)   Essential hypertension, benign   Obesity   Learning disability   Hyperlipidemia   Lumbosacral facet joint syndrome  Patient had significant post operative nausea and vomiting.  She also had difficulty with pain  control.  She slept in a chair because she could not tolerate the bed last night.  She is ambulating well but is having difficulty with pain control with the zero degree knee and the CPM.  Discussed the importance of both of these in her recovery and to be very cautious especially at night not to mix her XANAX with her oxycodone.  Discussed frankly with both the patient and her husband Advance diet Up with therapy Plan for discharge tomorrow Discharge home with home health    Linda Hedges 04/25/2018, 8:58 AM

## 2018-04-25 NOTE — Progress Notes (Signed)
Physical Therapy Treatment Patient Details Name: Julia Zimmerman MRN: [id number] DOB: [birth year] Today's Date: 04/25/2018    History of Present Illness 67 y.o. female admitted on 04/24/18 for elective R TKA.  Pt with significant PMH of lumbosacral facet joint syndrome, essential HTN, ADD, HA, L meniscus tear s/p surgery.     PT Comments    Pt in recliner with R foot in bone foam on entry. Pt continues to be limited in safe mobility by R LE pain and decreased ROM and strength, despite these limitations pt making good progress towards her goals. Pt currently min guard for transfers, supervision for ambulation of 300 feet with RW and min guard for ascent/descent of 4 step with railing. Pt will be ready for d/c home tomorrow.     Follow Up Recommendations  Home health PT;Supervision for mobility/OOB     Equipment Recommendations  None recommended by PT       Precautions / Restrictions Precautions Precautions: Knee Precaution Booklet Issued: Yes (comment) Precaution Comments: knee exercise handout given, review initiated Restrictions Weight Bearing Restrictions: Yes RLE Weight Bearing: Weight bearing as tolerated    Mobility  Bed Mobility               General bed mobility comments: OOB in recliner with R LE in Bone Foam  Transfers Overall transfer level: Needs assistance Equipment used: Rolling walker (2 wheeled) Transfers: Sit to/from Stand Sit to Stand: Min guard         General transfer comment: min guard for safety, good hand placement and power up before reaching to RW  Ambulation/Gait Ambulation/Gait assistance: Supervision Gait Distance (Feet): 300 Feet Assistive device: Rolling walker (2 wheeled) Gait Pattern/deviations: Antalgic;Step-through pattern;Decreased stance time - left;Decreased step length - right;Decreased weight shift to right;Shuffle Gait velocity: slowed Gait velocity interpretation: 1.31 - 2.62 ft/sec, indicative of limited community  ambulator General Gait Details: , beter L LE foot clearance with swing, more relaxation in UE and trunk   Stairs Stairs: Yes Stairs assistance: Min guard Stair Management: One rail Right;Sideways;Step to pattern Number of Stairs: 4 General stair comments: pt with strong, steady ascent/descent of 4 steps utilizing R railing. vc for sequencing and leaving enough space for both feet with descent       Balance Overall balance assessment: Needs assistance Sitting-balance support: Feet supported;No upper extremity supported Sitting balance-Leahy Scale: Good     Standing balance support: Bilateral upper extremity supported Standing balance-Leahy Scale: Fair Standing balance comment: able to maintain balance without support                            Cognition Arousal/Alertness: Awake/alert Behavior During Therapy: WFL for tasks assessed/performed Overall Cognitive Status: Within Functional Limits for tasks assessed                                        Exercises Total Joint Exercises Ankle Circles/Pumps: AROM;Both;20 reps Quad Sets: AROM;Right;5 reps;Seated Long Arc Quad: AROM;Right;10 reps Knee Flexion: AROM;AAROM;Right;10 reps;Seated Goniometric ROM: 5 to 83 degrees    General Comments        Pertinent Vitals/Pain Pain Assessment: 0-10 Pain Score: 4  Pain Location: R knee Pain Descriptors / Indicators: Grimacing;Guarding Pain Intervention(s): Limited activity within patient's tolerance;Monitored during session;Repositioned;Patient requesting pain meds-RN notified           PT Goals (current goals  can now be found in the care plan section) Acute Rehab PT Goals Patient Stated Goal: to get back to gardening PT Goal Formulation: With patient Time For Goal Achievement: 05/01/18 Potential to Achieve Goals: Good Progress towards PT goals: Progressing toward goals    Frequency    7X/week      PT Plan Current plan remains  appropriate       AM-PAC PT "6 Clicks" Mobility   Outcome Measure  Help needed turning from your back to your side while in a flat bed without using bedrails?: A Little Help needed moving from lying on your back to sitting on the side of a flat bed without using bedrails?: A Little Help needed moving to and from a bed to a chair (including a wheelchair)?: A Little Help needed standing up from a chair using your arms (e.g., wheelchair or bedside chair)?: A Little Help needed to walk in hospital room?: A Little Help needed climbing 3-5 steps with a railing? : A Little 6 Click Score: 18    End of Session Equipment Utilized During Treatment: Gait belt Activity Tolerance: Patient limited by pain Patient left: in chair;with call bell/phone within reach;with family/visitor present Nurse Communication: Mobility status;Patient requests pain meds PT Visit Diagnosis: Muscle weakness (generalized) (M62.81);Difficulty in walking, not elsewhere classified (R26.2);Pain Pain - Right/Left: Right Pain - part of body: Knee     Time: 1610-9604 PT Time Calculation (min) (ACUTE ONLY): 20 min  Charges:  $Gait Training: 8-22 mins                     Titus Drone B. Beverely Risen PT, DPT Acute Rehabilitation Services Pager [phone] Office [phone]    Elon Alas Fleet 04/25/2018, 4:52 PM

## 2018-04-25 NOTE — Plan of Care (Signed)
  Problem: Activity: Goal: Ability to avoid complications of mobility impairment will improve Outcome: Progressing   Problem: Pain Management: Goal: Pain level will decrease with appropriate interventions Outcome: Progressing   Problem: Safety: Goal: Ability to remain free from injury will improve Outcome: Progressing   Problem: Skin Integrity: Goal: Risk for impaired skin integrity will decrease Outcome: Progressing

## 2018-04-25 NOTE — Discharge Instructions (Signed)
INSTRUCTIONS AFTER JOINT REPLACEMENT   o Remove items at home which could result in a fall. This includes throw rugs or furniture in walking pathways o ICE to the affected joint every three hours while awake for 30 minutes at a time, for at least the first 3-5 days, and then as needed for pain and swelling.  Continue to use ice for pain and swelling. You may notice swelling that will progress down to the foot and ankle.  This is normal after surgery.  Elevate your leg when you are not up walking on it.   o Continue to use the breathing machine you got in the hospital (incentive spirometer) which will help keep your temperature down.  It is common for your temperature to cycle up and down following surgery, especially at night when you are not up moving around and exerting yourself.  The breathing machine keeps your lungs expanded and your temperature down.   DIET:  As you were doing prior to hospitalization, we recommend a well-balanced diet.  DRESSING / WOUND CARE / SHOWERING  Keep the surgical dressing until follow up.  The dressing is water proof, so you can shower without any extra covering.  IF THE DRESSING FALLS OFF or the wound gets wet inside, change the dressing with sterile gauze.  Please use good hand washing techniques before changing the dressing.  Do not use any lotions or creams on the incision until instructed by your surgeon.    ACTIVITY  o Increase activity slowly as tolerated, but follow the weight bearing instructions below.   o No driving for 6 weeks or until further direction given by your physician.  You cannot drive while taking narcotics.  o No lifting or carrying greater than 10 lbs. until further directed by your surgeon. o Avoid periods of inactivity such as sitting longer than an hour when not asleep. This helps prevent blood clots.  o You may return to work once you are authorized by your doctor.     WEIGHT BEARING   Other:  Weight bearing as tolerated   Use  walker for 5 days then progress to the cane.   EXERCISES  Results after joint replacement surgery are often greatly improved when you follow the exercise, range of motion and muscle strengthening exercises prescribed by your doctor. Safety measures are also important to protect the joint from further injury. Any time any of these exercises cause you to have increased pain or swelling, decrease what you are doing until you are comfortable again and then slowly increase them. If you have problems or questions, call your caregiver or physical therapist for advice.   Rehabilitation is important following a joint replacement. After just a few days of immobilization, the muscles of the leg can become weakened and shrink (atrophy).  These exercises are designed to build up the tone and strength of the thigh and leg muscles and to improve motion. Often times heat used for twenty to thirty minutes before working out will loosen up your tissues and help with improving the range of motion but do not use heat for the first two weeks following surgery (sometimes heat can increase post-operative swelling).   These exercises can be done on a training (exercise) mat, on the floor, on a table or on a bed. Use whatever works the best and is most comfortable for you.    Use music or television while you are exercising so that the exercises are a pleasant break in your day. This  will make your life better with the exercises acting as a break in your routine that you can look forward to.   Perform all exercises about fifteen times, three times per day or as directed.  You should exercise both the operative leg and the other leg as well.  Exercises include:    Quad Sets - Tighten up the muscle on the front of the thigh (Quad) and hold for 5-10 seconds.    Straight Leg Raises - With your knee straight (if you were given a brace, keep it on), lift the leg to 60 degrees, hold for 3 seconds, and slowly lower the leg.  Perform  this exercise against resistance later as your leg gets stronger.   Leg Slides: Lying on your back, slowly slide your foot toward your buttocks, bending your knee up off the floor (only go as far as is comfortable). Then slowly slide your foot back down until your leg is flat on the floor again.   Angel Wings: Lying on your back spread your legs to the side as far apart as you can without causing discomfort.   Hamstring Strength:  Lying on your back, push your heel against the floor with your leg straight by tightening up the muscles of your buttocks.  Repeat, but this time bend your knee to a comfortable angle, and push your heel against the floor.  You may put a pillow under the heel to make it more comfortable if necessary.   A rehabilitation program following joint replacement surgery can speed recovery and prevent re-injury in the future due to weakened muscles. Contact your doctor or a physical therapist for more information on knee rehabilitation.    CONSTIPATION  Constipation is defined medically as fewer than three stools per week and severe constipation as less than one stool per week.  Even if you have a regular bowel pattern at home, your normal regimen is likely to be disrupted due to multiple reasons following surgery.  Combination of anesthesia, postoperative narcotics, change in appetite and fluid intake all can affect your bowels.   YOU MUST use at least one of the following options; they are listed in order of increasing strength to get the job done.  They are all available over the counter, and you may need to use some, POSSIBLY even all of these options:    Drink plenty of fluids (prune juice may be helpful) and high fiber foods Colace 100 mg by mouth twice a day  Senokot for constipation as directed and as needed Dulcolax (bisacodyl), take with full glass of water  Miralax (polyethylene glycol) once or twice a day as needed.  If you have tried all these things and are  unable to have a bowel movement in the first 3-4 days after surgery call either your surgeon or your primary doctor.    If you experience loose stools or diarrhea, hold the medications until you stool forms back up.  If your symptoms do not get better within 1 week or if they get worse, check with your doctor.  If you experience "the worst abdominal pain ever" or develop nausea or vomiting, please contact the office immediately for further recommendations for treatment.   ITCHING:  If you experience itching with your medications, try taking only a single pain pill, or even half a pain pill at a time.  You can also use Benadryl over the counter for itching or also to help with sleep.   TED HOSE STOCKINGS:  Use  stockings on both legs until for at least 2 weeks or as directed by physician office. They may be removed at night for sleeping.  MEDICATIONS:  See your medication summary on the After Visit Summary that nursing will review with you.  You may have some home medications which will be placed on hold until you complete the course of blood thinner medication.  It is important for you to complete the blood thinner medication as prescribed.  PRECAUTIONS:  If you experience chest pain or shortness of breath - call 911 immediately for transfer to the hospital emergency department.   If you develop a fever greater that 101 F, purulent drainage from wound, increased redness or drainage from wound, foul odor from the wound/dressing, or calf pain - CONTACT YOUR SURGEON.                                                   FOLLOW-UP APPOINTMENTS:  If you do not already have a post-op appointment, please call the office for an appointment to be seen by your surgeon.  Guidelines for how soon to be seen are listed in your After Visit Summary, but are typically between 1-4 weeks after surgery.  OTHER INSTRUCTIONS:   Knee Replacement:  Do not place pillow under knee, focus on keeping the knee straight while  resting. CPM instructions: 0-90 degrees, 2 hours in the morning, 2 hours in the afternoon, and 2 hours in the evening. Place foam block, curve side up under heel at all times except when in CPM or when walking.  DO NOT modify, tear, cut, or change the foam block in any way.  MAKE SURE YOU:   Understand these instructions.   Get help right away if you are not doing well or get worse.    Thank you for letting us be a part of your medical care team.  It is a privilege we respect greatly.  We hope these instructions will help you stay on track for a fast and full recovery!

## 2018-04-26 DIAGNOSIS — D62 Acute posthemorrhagic anemia: Secondary | ICD-10-CM | POA: Diagnosis not present

## 2018-04-26 DIAGNOSIS — R51 Headache: Secondary | ICD-10-CM | POA: Diagnosis not present

## 2018-04-26 DIAGNOSIS — Z79899 Other long term (current) drug therapy: Secondary | ICD-10-CM | POA: Diagnosis not present

## 2018-04-26 DIAGNOSIS — M1711 Unilateral primary osteoarthritis, right knee: Secondary | ICD-10-CM | POA: Diagnosis not present

## 2018-04-26 DIAGNOSIS — E785 Hyperlipidemia, unspecified: Secondary | ICD-10-CM | POA: Diagnosis not present

## 2018-04-26 DIAGNOSIS — Z6830 Body mass index (BMI) 30.0-30.9, adult: Secondary | ICD-10-CM | POA: Diagnosis not present

## 2018-04-26 DIAGNOSIS — I10 Essential (primary) hypertension: Secondary | ICD-10-CM | POA: Diagnosis not present

## 2018-04-26 DIAGNOSIS — J310 Chronic rhinitis: Secondary | ICD-10-CM | POA: Diagnosis not present

## 2018-04-26 DIAGNOSIS — F418 Other specified anxiety disorders: Secondary | ICD-10-CM | POA: Diagnosis not present

## 2018-04-26 DIAGNOSIS — F988 Other specified behavioral and emotional disorders with onset usually occurring in childhood and adolescence: Secondary | ICD-10-CM | POA: Diagnosis not present

## 2018-04-26 DIAGNOSIS — E669 Obesity, unspecified: Secondary | ICD-10-CM | POA: Diagnosis not present

## 2018-04-26 DIAGNOSIS — Z87891 Personal history of nicotine dependence: Secondary | ICD-10-CM | POA: Diagnosis not present

## 2018-04-26 DIAGNOSIS — F819 Developmental disorder of scholastic skills, unspecified: Secondary | ICD-10-CM | POA: Diagnosis not present

## 2018-04-26 DIAGNOSIS — K219 Gastro-esophageal reflux disease without esophagitis: Secondary | ICD-10-CM | POA: Diagnosis not present

## 2018-04-26 LAB — BASIC METABOLIC PANEL
ANION GAP: 9 (ref 5–15)
BUN: 9 mg/dL (ref 8–23)
CO2: 28 mmol/L (ref 22–32)
Calcium: 8.7 mg/dL — ABNORMAL LOW (ref 8.9–10.3)
Chloride: 100 mmol/L (ref 98–111)
Creatinine, Ser: 0.71 mg/dL (ref 0.44–1.00)
GFR calc Af Amer: >60 mL/min (ref >60)
GFR calc non Af Amer: >60 mL/min (ref >60)
Glucose, Bld: 129 mg/dL — ABNORMAL HIGH (ref 70–99)
Potassium: 4.3 mmol/L (ref 3.5–5.1)
Sodium: 137 mmol/L (ref 135–145)

## 2018-04-26 LAB — CBC
HCT: 34.8 % — ABNORMAL LOW (ref 36.0–46.0)
Hemoglobin: 11.1 g/dL — ABNORMAL LOW (ref 12.0–15.0)
MCH: 29.1 pg (ref 26.0–34.0)
MCHC: 31.9 g/dL (ref 30.0–36.0)
MCV: 91.3 fL (ref 80.0–100.0)
NRBC: 0 % (ref 0.0–0.2)
Platelets: 268 10*3/uL (ref 150–400)
RBC: 3.81 MIL/uL — ABNORMAL LOW (ref 3.87–5.11)
RDW: 12.8 % (ref 11.5–15.5)
WBC: 13.7 10*3/uL — ABNORMAL HIGH (ref 4.0–10.5)

## 2018-04-26 MED ORDER — ACETAMINOPHEN 500 MG PO TABS
1000.0000 mg | ORAL_TABLET | Freq: Four times a day (QID) | ORAL | Status: DC
  Administered 2018-04-26: 1000 mg via ORAL
  Filled 2018-04-26: qty 2

## 2018-04-26 NOTE — Progress Notes (Signed)
Discharge instructions completed with pt.  Pt verbalized understanding of the information.  Pt denies chest pain, shortness of breath, dizziness, lightheadedness, and n/v.  Pt's IV discontinued.  Pt discharged home.  

## 2018-04-26 NOTE — Discharge Summary (Signed)
[facility]thopaedic Trauma Service (OTS) Discharge Summary   Patient ID: Julia Zimmerman MRN: [id number] DOB/AGE: [birth year] 67 y.o.  Admit date: 04/24/2018 Discharge date: 04/26/2018  Admission Diagnoses:   Primary localized osteoarthritis of right knee   ADD (attention deficit disorder)   Essential hypertension, benign   Obesity   Learning disability   Hyperlipidemia   Lumbosacral facet joint syndrome  Discharge Diagnoses:  Principal Problem:   Primary localized osteoarthritis of right knee Active Problems:   ADD (attention deficit disorder)   Essential hypertension, benign   Obesity   Learning disability   Hyperlipidemia   Lumbosacral facet joint syndrome   Past Medical History:  Diagnosis Date  . ADD 10/30/2008   Qualifier: Diagnosis of  By: Niel Hummer MD, Lorinda Creed Anxiety   . ANXIETY DEPRESSION 03/29/2007   Qualifier: Diagnosis of  By: Niel Hummer MD, Lorinda Creed   . Complication of anesthesia   . Cough 04/21/2010   full eval including allergist. Working diagnosis - reflux.  . Depression   . Endometriosis   . Episodic tension type headache 11/07/2007   Qualifier: Diagnosis of  By: Niel Hummer MD, [place] hypertension, benign 05/15/2010   Qualifier: Diagnosis of  By: Elizebeth Koller MD, Mora Appl   . GERD 04/21/2010   Qualifier: Diagnosis of  By: Niel Hummer MD, Lorinda Creed   . GERD (gastroesophageal reflux disease)   . Hyperlipidemia   . HYPERLIPIDEMIA 05/15/2010   Qualifier: Diagnosis of  By: Elizebeth Koller MD, Mora Appl   . Learning disability    poor attention span and difficulty with retention  . Lumbosacral facet joint syndrome 04/11/2018  . ONYCHOMYCOSIS 10/30/2008   successfully treated with lamisil.  [name] PONV (postoperative nausea and vomiting)   . Primary localized osteoarthritis of right knee 04/11/2018  . RHINITIS 04/21/2010   Qualifier: Diagnosis of  By: Niel Hummer MD, Lorinda Creed      Procedures Performed:  04/24/2018- Dr. Elsie Saas  TOTAL KNEE ARTHROPLASTY Using Depuy Attune RP implants #4 Femur, #4Tibia, 86mm  RP bearing, 32 Patella   Discharged Condition: good  Hospital Course:   Patient was admitted on 04/24/2018 for right total knee arthroplasty for end-stage DJD of her right knee.  Patient was taken to the PACU after surgery for recovery from anesthesia and then transferred to the orthopedic floor for observation, pain control and to begin therapies.  Patient's hospital stay was limited due to pain control.  She did require moderate amount of IV pain medication in order to achieve adequate pain control to allow her to participate with therapies.  She was unable to discharge home on postoperative day #1 as was planned due to pain control.  Over the remainder of the day on postoperative day #1 patient performed very well with physical therapies and was able to ambulate 300 feet.  She did still require some IV pain medication to optimize her for this patient.  Overnight patient did much better and had much better pain control.  She did have a little issue prior to bed but she received 1 dose of IV pain medication which allowed her to get much needed rest and then she was able to maintain her control with oral pain medication.  Patient continue to progress well with therapies and did very well on postoperative day #2 and was deemed to be stable for discharge to home.  Postoperative day #2 patient was doing very well she was tolerating a regular diet  voiding without difficulty and did have a bowel movement.  She was covered with routine perioperative antibiotics.  She was started on aspirin for DVT and PE prophylaxis as per surgical team guidelines.  Patient was discharged in stable condition on postoperative day #2.  She will follow-up with Dr.Wainer on 05/08/2018.  All the patient's medications were electronically prescribed and she had all of her medical equipment prior to discharge as well.  Consults: None  Significant  Diagnostic Studies: labs:   Results for Julia, Zimmerman (MRN [id number]) as of 04/26/2018 12:38  Ref. Range 04/26/2018 03:10  Sodium Latest Ref Range: 135 - 145 mmol/L 137  Potassium Latest Ref Range: 3.5 - 5.1 mmol/L 4.3  Chloride Latest Ref Range: 98 - 111 mmol/L 100  CO2 Latest Ref Range: 22 - 32 mmol/L 28  Glucose Latest Ref Range: 70 - 99 mg/dL 129 (H)  BUN Latest Ref Range: 8 - 23 mg/dL 9  Creatinine Latest Ref Range: 0.44 - 1.00 mg/dL 0.71  Calcium Latest Ref Range: 8.9 - 10.3 mg/dL 8.7 (L)  Anion gap Latest Ref Range: 5 - 15  9  GFR, Est Non African American Latest Ref Range: >60 mL/min >60  GFR, Est African American Latest Ref Range: >60 mL/min >60  WBC Latest Ref Range: 4.0 - 10.5 K/uL 13.7 (H)  RBC Latest Ref Range: 3.87 - 5.11 MIL/uL 3.81 (L)  Hemoglobin Latest Ref Range: 12.0 - 15.0 g/dL 11.1 (L)  HCT Latest Ref Range: 36.0 - 46.0 % 34.8 (L)  MCV Latest Ref Range: 80.0 - 100.0 fL 91.3  MCH Latest Ref Range: 26.0 - 34.0 pg 29.1  MCHC Latest Ref Range: 30.0 - 36.0 g/dL 31.9  RDW Latest Ref Range: 11.5 - 15.5 % 12.8  Platelets Latest Ref Range: 150 - 400 K/uL 268  nRBC Latest Ref Range: 0.0 - 0.2 % 0.0    Treatments: IV hydration, antibiotics: Ancef, analgesia: acetaminophen, Dilaudid and Oxy IR, anticoagulation: ASA, therapies: PT, OT and RN and surgery: as above  Discharge Exam:  Orthopedic Trauma Service Progress Note   Patient ID: Julia Zimmerman MRN: [id number] DOB/AGE: 1950/09/12 67 y.o.   Subjective:   Doing better this am Some pain issues overnight but improved now Think she overdid her therapy yesterday    Got some good sleep and feels ready to go home today      + void + flatus and + BM    No acute issues of note    Review of Systems  Constitutional: Negative for chills and fever.  Respiratory: Negative for shortness of breath and wheezing.   Cardiovascular: Negative for chest pain and palpitations.  Gastrointestinal: Negative for abdominal  pain, nausea and vomiting.  Neurological: Negative for tingling and sensory change.      Objective:    VITALS:         Vitals:    04/25/18 0306 04/25/18 1606 04/25/18 2019 04/26/18 0355  BP: 114/68 137/83 (!) 155/89 129/86  Pulse: 87 70 68 69  Resp: 17 16 16 16   Temp: (!) 97 F (36.1 C) 98.1 F (36.7 C) 98.2 F (36.8 C) 97.9 F (36.6 C)  TempSrc: Rectal Oral Oral Oral  SpO2: 93% 95% 95% 91%  Weight:          Height:              Estimated body mass index is 30.18 kg/m as calculated from the following:   Height as of this encounter: 5\' 2"  (1.575 m).  Weight as of this encounter: 74.8 kg.     Intake/Output      12/24 0701 - 12/25 0700 12/25 0701 - 12/26 0700   P.O. 480 300   I.V. (mL/kg)     IV Piggyback     Total Intake(mL/kg) 480 (6.4) 300 (4)   Urine (mL/kg/hr)     Blood     Total Output     Net +480 +300        Urine Occurrence 3 x    Stool Occurrence 1 x       LABS   Lab Results Last 24 Hours       Results for orders placed or performed during the hospital encounter of 04/24/18 (from the past 24 hour(s))  CBC     Status: Abnormal    Collection Time: 04/26/18  3:10 AM  Result Value Ref Range    WBC 13.7 (H) 4.0 - 10.5 K/uL    RBC 3.81 (L) 3.87 - 5.11 MIL/uL    Hemoglobin 11.1 (L) 12.0 - 15.0 g/dL    HCT 34.8 (L) 36.0 - 46.0 %    MCV 91.3 80.0 - 100.0 fL    MCH 29.1 26.0 - 34.0 pg    MCHC 31.9 30.0 - 36.0 g/dL    RDW 12.8 11.5 - 15.5 %    Platelets 268 150 - 400 K/uL    nRBC 0.0 0.0 - 0.2 %  Basic metabolic panel     Status: Abnormal    Collection Time: 04/26/18  3:10 AM  Result Value Ref Range    Sodium 137 135 - 145 mmol/L    Potassium 4.3 3.5 - 5.1 mmol/L    Chloride 100 98 - 111 mmol/L    CO2 28 22 - 32 mmol/L    Glucose, Bld 129 (H) 70 - 99 mg/dL    BUN 9 8 - 23 mg/dL    Creatinine, Ser 0.71 0.44 - 1.00 mg/dL    Calcium 8.7 (L) 8.9 - 10.3 mg/dL    GFR calc non Af Amer >60 >60 mL/min    GFR calc Af Amer >60 >60 mL/min    Anion gap 9 5  - 15          PHYSICAL EXAM:    Gen: very pleasant, sitting up in bed in zero knee bone foam. NAD. Appears well  Lungs: breathing unlabored Cardiac: RRR Abd: + BS, NTND Ext:        Right Lower Extremity              Dressing stable with scant drainage to R knee             TED hose in place              Distal motor and sensory functions intact             Ext warm             + DP pulse             No pain with passives stretch              No DCT                 Assessment/Plan: 2 Days Post-Op    Principal Problem:   Primary localized osteoarthritis of right knee Active Problems:   ADD (attention deficit disorder)   Essential hypertension, benign   Obesity   Learning disability   Hyperlipidemia  Lumbosacral facet joint syndrome                Anti-infectives (From admission, onward)    Start     Dose/Rate Route Frequency Ordered Stop    04/24/18 1300   ceFAZolin (ANCEF) IVPB 2g/100 mL premix     2 g 200 mL/hr over 30 Minutes Intravenous Every 6 hours 04/24/18 1037 04/24/18 1855    04/24/18 0615   ceFAZolin (ANCEF) IVPB 2g/100 mL premix     2 g 200 mL/hr over 30 Minutes Intravenous On call to O.R. 04/24/18 0962 04/24/18 8366     .   POD/HD#: 2   67 y/o female s/p R TKA    -Endstage DJD R knee s/p R TKA             WBAT R leg             CPM             PT/OT             Follow up with Dr. Noemi Chapel on 05/08/2018                - Pain management:             Continue with current regimen    - ABL anemia/Hemodynamics             Stable    - Medical issues              Home meds      - Dispo:             Dc home today              Follow up with Dr. Noemi Chapel on 05/08/2018    Anticipated LOS equal to or greater than 2 midnights due to - Age 66 and older with one or more of the following:             - Obesity             - Expected need for hospital services (PT, OT, Nursing) required for safe   discharge             - Anticipated need for  postoperative skilled nursing care or inpatient rehab             - Active co-morbidities: None OR    - Unanticipated findings during/Post Surgery: Slow post-op progression: GI, pain control, mobility  - Patient is a high risk of re-admission due to: None       Jari Pigg, PA-C [phone] (C) 04/26/2018, 12:26 PM   Disposition: Home   Allergies as of 04/26/2018      Reactions   Sertraline Hcl Hives, Itching   Sulfa Antibiotics Hives   Codeine Nausea Only   Hydrocodone Nausea Only      Medication List    STOP taking these medications   butalbital-acetaminophen-caffeine 50-325-40 MG tablet Commonly known as:  FIORICET, ESGIC   hydrochlorothiazide 12.5 MG capsule Commonly known as:  MICROZIDE   Icosapent Ethyl 1 g Caps Commonly known as:  VASCEPA   losartan 50 MG tablet Commonly known as:  COZAAR   Vitamin B-12 500 MCG Subl     TAKE these medications   alprazolam 2 MG tablet Commonly known as:  XANAX TAKE 1 TABLET BY MOUTH qhs prn insomnia What changed:    how much to take  how to take this  when  to take this  additional instructions   amphetamine-dextroamphetamine 5 MG 24 hr capsule Commonly known as:  ADDERALL XR Take 1 capsule (5 mg total) by mouth daily.   aspirin 325 MG EC tablet 1 tab a day for the next 30 days to prevent blood clots   diphenhydrAMINE 25 MG tablet Commonly known as:  BENADRYL Take 25 mg by mouth daily as needed for allergies.   docusate sodium 100 MG capsule Commonly known as:  COLACE 1 tab 2 times a day while on narcotics.  STOOL SOFTENER   gabapentin 300 MG capsule Commonly known as:  NEURONTIN 1 po qhs for nerve pain   omeprazole 20 MG capsule Commonly known as:  PRILOSEC Take 1 capsule (20 mg total) by mouth daily. What changed:    when to take this  reasons to take this   ondansetron 4 MG tablet Commonly known as:  ZOFRAN Take 1 tablet (4 mg total) by mouth every 6 (six) hours as needed for nausea.    oxyCODONE 5 MG immediate release tablet Commonly known as:  Oxy IR/ROXICODONE 1 po q 4 hrs prn pain.  Patient had a total knee replacement on 04/24/2018   polyethylene glycol packet Commonly known as:  MIRALAX / GLYCOLAX 17grams in 6 oz of something to drink twice a day until bowel movement.  LAXITIVE.  Restart if two days since last bowel movement   venlafaxine XR 150 MG 24 hr capsule Commonly known as:  EFFEXOR-XR Take 1 capsule (150 mg total) by mouth daily with breakfast. What changed:    when to take this  additional instructions   Vitamin D3 1.25 MG (50000 UT) Caps Take 1 capsule once a week by mouth.   Vitamin D3 50 MCG (2000 UT) capsule Take 1 capsule (2,000 Units total) daily by mouth.      Follow-up Information    Elsie Saas, MD On 05/08/2018.   Specialty:  Orthopedic Surgery Why:  arrive at 1:30 for 2 pm physical therapy appointment then appointment with Dr Noemi Chapel at 3:30pm Contact information: [address] [place] [address] [phone]           Discharge Instructions and Plan:  67 y/o female s/p R TKA    -Endstage DJD R knee s/p R TKA             WBAT R leg             CPM             PT/OT             Follow up with Dr. Noemi Chapel on 05/08/2018   Aspirin for DVT/PE prophylaxis per surgical team                 - Pain management:             Continue with current regimen    Minimize narcotic use as much as possible    - Medical issues              Home meds      - Dispo:             Dc home today              Follow up with Dr. Noemi Chapel on 05/08/2018   Signed:  Jari Pigg, PA-C [phone] (C) 04/26/2018, 12:43 PM  [facility] [place] [address] [phone] Domingo Sep (F)

## 2018-04-26 NOTE — Progress Notes (Signed)
Orthopedic Trauma Service Progress Note  Patient ID: Julia Zimmerman MRN: [id number] DOB/AGE: 07/18/1950 67 y.o.  Subjective:  Doing better this am Some pain issues overnight but improved now Think she overdid her therapy yesterday   Got some good sleep and feels ready to go home today    + void + flatus and + BM   No acute issues of note   Review of Systems  Constitutional: Negative for chills and fever.  Respiratory: Negative for shortness of breath and wheezing.   Cardiovascular: Negative for chest pain and palpitations.  Gastrointestinal: Negative for abdominal pain, nausea and vomiting.  Neurological: Negative for tingling and sensory change.    Objective:   VITALS:   Vitals:   04/25/18 0306 04/25/18 1606 04/25/18 2019 04/26/18 0355  BP: 114/68 137/83 (!) 155/89 129/86  Pulse: 87 70 68 69  Resp: 17 16 16 16   Temp: (!) 97 F (36.1 C) 98.1 F (36.7 C) 98.2 F (36.8 C) 97.9 F (36.6 C)  TempSrc: Rectal Oral Oral Oral  SpO2: 93% 95% 95% 91%  Weight:      Height:        Estimated body mass index is 30.18 kg/m as calculated from the following:   Height as of this encounter: 5\' 2"  (1.575 m).   Weight as of this encounter: 74.8 kg.   Intake/Output      12/24 0701 - 12/25 0700 12/25 0701 - 12/26 0700   P.O. 480 300   I.V. (mL/kg)     IV Piggyback     Total Intake(mL/kg) 480 (6.4) 300 (4)   Urine (mL/kg/hr)     Blood     Total Output     Net +480 +300        Urine Occurrence 3 x    Stool Occurrence 1 x      LABS  Results for orders placed or performed during the hospital encounter of 04/24/18 (from the past 24 hour(s))  CBC     Status: Abnormal   Collection Time: 04/26/18  3:10 AM  Result Value Ref Range   WBC 13.7 (H) 4.0 - 10.5 K/uL   RBC 3.81 (L) 3.87 - 5.11 MIL/uL   Hemoglobin 11.1 (L) 12.0 - 15.0 g/dL   HCT 34.8 (L) 36.0 - 46.0 %   MCV 91.3 80.0 - 100.0 fL   MCH  29.1 26.0 - 34.0 pg   MCHC 31.9 30.0 - 36.0 g/dL   RDW 12.8 11.5 - 15.5 %   Platelets 268 150 - 400 K/uL   nRBC 0.0 0.0 - 0.2 %  Basic metabolic panel     Status: Abnormal   Collection Time: 04/26/18  3:10 AM  Result Value Ref Range   Sodium 137 135 - 145 mmol/L   Potassium 4.3 3.5 - 5.1 mmol/L   Chloride 100 98 - 111 mmol/L   CO2 28 22 - 32 mmol/L   Glucose, Bld 129 (H) 70 - 99 mg/dL   BUN 9 8 - 23 mg/dL   Creatinine, Ser 0.71 0.44 - 1.00 mg/dL   Calcium 8.7 (L) 8.9 - 10.3 mg/dL   GFR calc non Af Amer >60 >60 mL/min   GFR calc Af Amer >60 >60 mL/min   Anion gap 9 5 - 15     PHYSICAL EXAM:  Gen: very pleasant, sitting up in bed in zero knee bone foam. NAD. Appears well  Lungs: breathing unlabored Cardiac: RRR Abd: + BS, NTND Ext:        Right Lower Extremity   Dressing stable with scant drainage to R knee  TED hose in place   Distal motor and sensory functions intact  Ext warm  + DP pulse  No pain with passives stretch   No DCT     Assessment/Plan: 2 Days Post-Op   Principal Problem:   Primary localized osteoarthritis of right knee Active Problems:   ADD (attention deficit disorder)   Essential hypertension, benign   Obesity   Learning disability   Hyperlipidemia   Lumbosacral facet joint syndrome   Anti-infectives (From admission, onward)   Start     Dose/Rate Route Frequency Ordered Stop   04/24/18 1300  ceFAZolin (ANCEF) IVPB 2g/100 mL premix     2 g 200 mL/hr over 30 Minutes Intravenous Every 6 hours 04/24/18 1037 04/24/18 1855   04/24/18 0615  ceFAZolin (ANCEF) IVPB 2g/100 mL premix     2 g 200 mL/hr over 30 Minutes Intravenous On call to O.R. 04/24/18 1497 04/24/18 0263    .  POD/HD#: 2  67 y/o female s/p R TKA   -Endstage DJD R knee s/p R TKA  WBAT R leg  CPM  PT/OT  Follow up with Dr. Noemi Zimmerman on 05/08/2018    - Pain management:  Continue with current regimen   - ABL anemia/Hemodynamics  Stable   - Medical issues   Home meds     - Dispo:  Dc home today   Follow up with Dr. Noemi Zimmerman on 05/08/2018   Anticipated LOS equal to or greater than 2 midnights due to - Age 65 and older with one or more of the following:  - Obesity  - Expected need for hospital services (PT, OT, Nursing) required for safe  discharge  - Anticipated need for postoperative skilled nursing care or inpatient rehab  - Active co-morbidities: None OR   - Unanticipated findings during/Post Surgery: Slow post-op progression: GI, pain control, mobility  - Patient is a high risk of re-admission due to: None    Jari Pigg, PA-C [phone] (C) 04/26/2018, 12:26 PM  [facility] [place] [id number] [phone] Julia Zimmerman (F)

## 2018-04-26 NOTE — Plan of Care (Signed)
  Problem: Pain Management: Goal: Pain level will decrease with appropriate interventions Outcome: Progressing   Problem: Pain Managment: Goal: General experience of comfort will improve Outcome: Progressing   

## 2018-04-26 NOTE — Progress Notes (Signed)
Physical Therapy Treatment Patient Details Name: Julia Zimmerman MRN: [id number] DOB: [birth year] Today's Date: 04/26/2018    History of Present Illness 67 y.o. female admitted on 04/24/18 for elective R TKA.  Pt with significant PMH of lumbosacral facet joint syndrome, essential HTN, ADD, HA, L meniscus tear s/p surgery.     PT Comments    Pt limited by pain today however she still anticipates DC home later today. She is frustrated with the apparent lack of communication on what meds she can have and her level of pain control with just oral medication. Limited activity today within her tolerance and stressed the importance of early ROM in the recovery process.  I have answered all patient's question regarding PT and mobility.       Follow Up Recommendations        Equipment Recommendations       Recommendations for Other Services       Precautions / Restrictions Precautions Precautions: Knee Precaution Booklet Issued: Yes (comment) Restrictions Weight Bearing Restrictions: Yes RLE Weight Bearing: Weight bearing as tolerated    Mobility  Bed Mobility Overal bed mobility: Needs Assistance Bed Mobility: Supine to Sit     Supine to sit: Min assist(Assist with RLE)        Transfers Overall transfer level: Needs assistance Equipment used: Rolling walker (2 wheeled) Transfers: Sit to/from Stand Sit to Stand: Supervision            Ambulation/Gait Ambulation/Gait assistance: Supervision Gait Distance (Feet): 100 Feet Assistive device: Rolling walker (2 wheeled) Gait Pattern/deviations: Antalgic;Step-through pattern;Decreased stance time - left;Decreased step length - right;Decreased weight shift to right     General Gait Details: Pt naustead and painful throughout walk limiting ability to go further   Stairs Stairs: (reviewed safest technique for stairs)           Wheelchair Mobility    Modified Rankin (Stroke Patients Only)       Balance                                             Cognition Arousal/Alertness: Awake/alert Behavior During Therapy: WFL for tasks assessed/performed Overall Cognitive Status: Within Functional Limits for tasks assessed                                        Exercises Total Joint Exercises Ankle Circles/Pumps: AROM;Both;10 reps;Supine Quad Sets: AROM;Right;5 reps;Supine    General Comments General comments (skin integrity, edema, etc.): Acivity limited by patients pain level.       Pertinent Vitals/Pain Pain Assessment: 0-10 Pain Score: 9  Pain Location: R knee Pain Descriptors / Indicators: Constant;Grimacing Pain Intervention(s): Limited activity within patient's tolerance;Premedicated before session    Home Living                      Prior Function            PT Goals (current goals can now be found in the care plan section) Progress towards PT goals: Progressing toward goals    Frequency           PT Plan      Co-evaluation              AM-PAC PT "6 Clicks" Mobility  Outcome Measure  Help needed turning from your back to your side while in a flat bed without using bedrails?: A Little Help needed moving from lying on your back to sitting on the side of a flat bed without using bedrails?: A Little Help needed moving to and from a bed to a chair (including a wheelchair)?: A Little Help needed standing up from a chair using your arms (e.g., wheelchair or bedside chair)?: A Little Help needed to walk in hospital room?: A Little Help needed climbing 3-5 steps with a railing? : A Little 6 Click Score: 18    End of Session Equipment Utilized During Treatment: Gait belt Activity Tolerance: Patient limited by pain Patient left: in bed;with call bell/phone within reach         Time: 0927-0952 PT Time Calculation (min) (ACUTE ONLY): 25 min  Charges:  $Gait Training: 8-22 mins $Therapeutic Exercise: 8-22 mins                      Lavonia Dana, PT   Acute Rehabilitation Services  Pager [phone] Office [phone] 04/26/2018    Melvern Banker 04/26/2018, 11:22 AM

## 2018-04-27 DIAGNOSIS — E785 Hyperlipidemia, unspecified: Secondary | ICD-10-CM | POA: Diagnosis not present

## 2018-04-27 DIAGNOSIS — M5387 Other specified dorsopathies, lumbosacral region: Secondary | ICD-10-CM | POA: Diagnosis not present

## 2018-04-27 DIAGNOSIS — F329 Major depressive disorder, single episode, unspecified: Secondary | ICD-10-CM | POA: Diagnosis not present

## 2018-04-27 DIAGNOSIS — F419 Anxiety disorder, unspecified: Secondary | ICD-10-CM | POA: Diagnosis not present

## 2018-04-27 DIAGNOSIS — Z471 Aftercare following joint replacement surgery: Secondary | ICD-10-CM | POA: Diagnosis not present

## 2018-04-27 DIAGNOSIS — I10 Essential (primary) hypertension: Secondary | ICD-10-CM | POA: Diagnosis not present

## 2018-04-27 DIAGNOSIS — E669 Obesity, unspecified: Secondary | ICD-10-CM | POA: Diagnosis not present

## 2018-04-27 DIAGNOSIS — Z7982 Long term (current) use of aspirin: Secondary | ICD-10-CM | POA: Diagnosis not present

## 2018-04-27 DIAGNOSIS — K219 Gastro-esophageal reflux disease without esophagitis: Secondary | ICD-10-CM | POA: Diagnosis not present

## 2018-04-27 DIAGNOSIS — Z9181 History of falling: Secondary | ICD-10-CM | POA: Diagnosis not present

## 2018-04-27 DIAGNOSIS — Z96651 Presence of right artificial knee joint: Secondary | ICD-10-CM | POA: Diagnosis not present

## 2018-04-27 DIAGNOSIS — F819 Developmental disorder of scholastic skills, unspecified: Secondary | ICD-10-CM | POA: Diagnosis not present

## 2018-04-27 DIAGNOSIS — Z6830 Body mass index (BMI) 30.0-30.9, adult: Secondary | ICD-10-CM | POA: Diagnosis not present

## 2018-04-27 DIAGNOSIS — F909 Attention-deficit hyperactivity disorder, unspecified type: Secondary | ICD-10-CM | POA: Diagnosis not present

## 2018-04-27 DIAGNOSIS — Z87891 Personal history of nicotine dependence: Secondary | ICD-10-CM | POA: Diagnosis not present

## 2018-04-29 DIAGNOSIS — K219 Gastro-esophageal reflux disease without esophagitis: Secondary | ICD-10-CM | POA: Diagnosis not present

## 2018-04-29 DIAGNOSIS — Z9181 History of falling: Secondary | ICD-10-CM | POA: Diagnosis not present

## 2018-04-29 DIAGNOSIS — F909 Attention-deficit hyperactivity disorder, unspecified type: Secondary | ICD-10-CM | POA: Diagnosis not present

## 2018-04-29 DIAGNOSIS — I10 Essential (primary) hypertension: Secondary | ICD-10-CM | POA: Diagnosis not present

## 2018-04-29 DIAGNOSIS — F819 Developmental disorder of scholastic skills, unspecified: Secondary | ICD-10-CM | POA: Diagnosis not present

## 2018-04-29 DIAGNOSIS — F419 Anxiety disorder, unspecified: Secondary | ICD-10-CM | POA: Diagnosis not present

## 2018-04-29 DIAGNOSIS — E785 Hyperlipidemia, unspecified: Secondary | ICD-10-CM | POA: Diagnosis not present

## 2018-04-29 DIAGNOSIS — M5387 Other specified dorsopathies, lumbosacral region: Secondary | ICD-10-CM | POA: Diagnosis not present

## 2018-04-29 DIAGNOSIS — E669 Obesity, unspecified: Secondary | ICD-10-CM | POA: Diagnosis not present

## 2018-04-29 DIAGNOSIS — Z471 Aftercare following joint replacement surgery: Secondary | ICD-10-CM | POA: Diagnosis not present

## 2018-04-29 DIAGNOSIS — Z96651 Presence of right artificial knee joint: Secondary | ICD-10-CM | POA: Diagnosis not present

## 2018-04-29 DIAGNOSIS — Z87891 Personal history of nicotine dependence: Secondary | ICD-10-CM | POA: Diagnosis not present

## 2018-04-29 DIAGNOSIS — Z7982 Long term (current) use of aspirin: Secondary | ICD-10-CM | POA: Diagnosis not present

## 2018-04-29 DIAGNOSIS — Z6830 Body mass index (BMI) 30.0-30.9, adult: Secondary | ICD-10-CM | POA: Diagnosis not present

## 2018-04-29 DIAGNOSIS — F329 Major depressive disorder, single episode, unspecified: Secondary | ICD-10-CM | POA: Diagnosis not present

## 2018-05-01 DIAGNOSIS — I10 Essential (primary) hypertension: Secondary | ICD-10-CM | POA: Diagnosis not present

## 2018-05-01 DIAGNOSIS — M5387 Other specified dorsopathies, lumbosacral region: Secondary | ICD-10-CM | POA: Diagnosis not present

## 2018-05-01 DIAGNOSIS — Z87891 Personal history of nicotine dependence: Secondary | ICD-10-CM | POA: Diagnosis not present

## 2018-05-01 DIAGNOSIS — Z96651 Presence of right artificial knee joint: Secondary | ICD-10-CM | POA: Diagnosis not present

## 2018-05-01 DIAGNOSIS — F419 Anxiety disorder, unspecified: Secondary | ICD-10-CM | POA: Diagnosis not present

## 2018-05-01 DIAGNOSIS — F909 Attention-deficit hyperactivity disorder, unspecified type: Secondary | ICD-10-CM | POA: Diagnosis not present

## 2018-05-01 DIAGNOSIS — F819 Developmental disorder of scholastic skills, unspecified: Secondary | ICD-10-CM | POA: Diagnosis not present

## 2018-05-01 DIAGNOSIS — Z6830 Body mass index (BMI) 30.0-30.9, adult: Secondary | ICD-10-CM | POA: Diagnosis not present

## 2018-05-01 DIAGNOSIS — F329 Major depressive disorder, single episode, unspecified: Secondary | ICD-10-CM | POA: Diagnosis not present

## 2018-05-01 DIAGNOSIS — Z9181 History of falling: Secondary | ICD-10-CM | POA: Diagnosis not present

## 2018-05-01 DIAGNOSIS — K219 Gastro-esophageal reflux disease without esophagitis: Secondary | ICD-10-CM | POA: Diagnosis not present

## 2018-05-01 DIAGNOSIS — E785 Hyperlipidemia, unspecified: Secondary | ICD-10-CM | POA: Diagnosis not present

## 2018-05-01 DIAGNOSIS — Z471 Aftercare following joint replacement surgery: Secondary | ICD-10-CM | POA: Diagnosis not present

## 2018-05-01 DIAGNOSIS — E669 Obesity, unspecified: Secondary | ICD-10-CM | POA: Diagnosis not present

## 2018-05-01 DIAGNOSIS — Z7982 Long term (current) use of aspirin: Secondary | ICD-10-CM | POA: Diagnosis not present

## 2018-05-03 DIAGNOSIS — M1711 Unilateral primary osteoarthritis, right knee: Secondary | ICD-10-CM | POA: Diagnosis not present

## 2018-05-03 DIAGNOSIS — Z96651 Presence of right artificial knee joint: Secondary | ICD-10-CM | POA: Diagnosis not present

## 2018-05-04 DIAGNOSIS — K219 Gastro-esophageal reflux disease without esophagitis: Secondary | ICD-10-CM | POA: Diagnosis not present

## 2018-05-04 DIAGNOSIS — E785 Hyperlipidemia, unspecified: Secondary | ICD-10-CM | POA: Diagnosis not present

## 2018-05-04 DIAGNOSIS — Z6830 Body mass index (BMI) 30.0-30.9, adult: Secondary | ICD-10-CM | POA: Diagnosis not present

## 2018-05-04 DIAGNOSIS — Z9181 History of falling: Secondary | ICD-10-CM | POA: Diagnosis not present

## 2018-05-04 DIAGNOSIS — F329 Major depressive disorder, single episode, unspecified: Secondary | ICD-10-CM | POA: Diagnosis not present

## 2018-05-04 DIAGNOSIS — Z96651 Presence of right artificial knee joint: Secondary | ICD-10-CM | POA: Diagnosis not present

## 2018-05-04 DIAGNOSIS — I10 Essential (primary) hypertension: Secondary | ICD-10-CM | POA: Diagnosis not present

## 2018-05-04 DIAGNOSIS — Z7982 Long term (current) use of aspirin: Secondary | ICD-10-CM | POA: Diagnosis not present

## 2018-05-04 DIAGNOSIS — Z87891 Personal history of nicotine dependence: Secondary | ICD-10-CM | POA: Diagnosis not present

## 2018-05-04 DIAGNOSIS — M5387 Other specified dorsopathies, lumbosacral region: Secondary | ICD-10-CM | POA: Diagnosis not present

## 2018-05-04 DIAGNOSIS — F819 Developmental disorder of scholastic skills, unspecified: Secondary | ICD-10-CM | POA: Diagnosis not present

## 2018-05-04 DIAGNOSIS — E669 Obesity, unspecified: Secondary | ICD-10-CM | POA: Diagnosis not present

## 2018-05-04 DIAGNOSIS — F419 Anxiety disorder, unspecified: Secondary | ICD-10-CM | POA: Diagnosis not present

## 2018-05-04 DIAGNOSIS — Z471 Aftercare following joint replacement surgery: Secondary | ICD-10-CM | POA: Diagnosis not present

## 2018-05-04 DIAGNOSIS — F909 Attention-deficit hyperactivity disorder, unspecified type: Secondary | ICD-10-CM | POA: Diagnosis not present

## 2018-05-05 DIAGNOSIS — Z96651 Presence of right artificial knee joint: Secondary | ICD-10-CM | POA: Diagnosis not present

## 2018-05-05 DIAGNOSIS — F819 Developmental disorder of scholastic skills, unspecified: Secondary | ICD-10-CM | POA: Diagnosis not present

## 2018-05-05 DIAGNOSIS — M5387 Other specified dorsopathies, lumbosacral region: Secondary | ICD-10-CM | POA: Diagnosis not present

## 2018-05-05 DIAGNOSIS — Z471 Aftercare following joint replacement surgery: Secondary | ICD-10-CM | POA: Diagnosis not present

## 2018-05-05 DIAGNOSIS — Z6830 Body mass index (BMI) 30.0-30.9, adult: Secondary | ICD-10-CM | POA: Diagnosis not present

## 2018-05-05 DIAGNOSIS — F909 Attention-deficit hyperactivity disorder, unspecified type: Secondary | ICD-10-CM | POA: Diagnosis not present

## 2018-05-05 DIAGNOSIS — F419 Anxiety disorder, unspecified: Secondary | ICD-10-CM | POA: Diagnosis not present

## 2018-05-05 DIAGNOSIS — Z7982 Long term (current) use of aspirin: Secondary | ICD-10-CM | POA: Diagnosis not present

## 2018-05-05 DIAGNOSIS — Z9181 History of falling: Secondary | ICD-10-CM | POA: Diagnosis not present

## 2018-05-05 DIAGNOSIS — Z87891 Personal history of nicotine dependence: Secondary | ICD-10-CM | POA: Diagnosis not present

## 2018-05-05 DIAGNOSIS — I10 Essential (primary) hypertension: Secondary | ICD-10-CM | POA: Diagnosis not present

## 2018-05-05 DIAGNOSIS — F329 Major depressive disorder, single episode, unspecified: Secondary | ICD-10-CM | POA: Diagnosis not present

## 2018-05-05 DIAGNOSIS — E669 Obesity, unspecified: Secondary | ICD-10-CM | POA: Diagnosis not present

## 2018-05-05 DIAGNOSIS — K219 Gastro-esophageal reflux disease without esophagitis: Secondary | ICD-10-CM | POA: Diagnosis not present

## 2018-05-05 DIAGNOSIS — E785 Hyperlipidemia, unspecified: Secondary | ICD-10-CM | POA: Diagnosis not present

## 2018-05-07 ENCOUNTER — Encounter: Payer: Self-pay | Admitting: Gastroenterology

## 2018-05-08 DIAGNOSIS — M25561 Pain in right knee: Secondary | ICD-10-CM | POA: Diagnosis not present

## 2018-05-08 DIAGNOSIS — M6281 Muscle weakness (generalized): Secondary | ICD-10-CM | POA: Diagnosis not present

## 2018-05-08 DIAGNOSIS — M1711 Unilateral primary osteoarthritis, right knee: Secondary | ICD-10-CM | POA: Diagnosis not present

## 2018-05-08 DIAGNOSIS — Z96651 Presence of right artificial knee joint: Secondary | ICD-10-CM | POA: Diagnosis not present

## 2018-05-10 DIAGNOSIS — Z96651 Presence of right artificial knee joint: Secondary | ICD-10-CM | POA: Diagnosis not present

## 2018-05-10 DIAGNOSIS — M1711 Unilateral primary osteoarthritis, right knee: Secondary | ICD-10-CM | POA: Diagnosis not present

## 2018-05-10 DIAGNOSIS — M6281 Muscle weakness (generalized): Secondary | ICD-10-CM | POA: Diagnosis not present

## 2018-05-10 DIAGNOSIS — M25561 Pain in right knee: Secondary | ICD-10-CM | POA: Diagnosis not present

## 2018-05-11 ENCOUNTER — Other Ambulatory Visit: Payer: Self-pay | Admitting: Internal Medicine

## 2018-05-12 DIAGNOSIS — M25561 Pain in right knee: Secondary | ICD-10-CM | POA: Diagnosis not present

## 2018-05-12 DIAGNOSIS — M6281 Muscle weakness (generalized): Secondary | ICD-10-CM | POA: Diagnosis not present

## 2018-05-12 DIAGNOSIS — M1711 Unilateral primary osteoarthritis, right knee: Secondary | ICD-10-CM | POA: Diagnosis not present

## 2018-05-12 DIAGNOSIS — Z96651 Presence of right artificial knee joint: Secondary | ICD-10-CM | POA: Diagnosis not present

## 2018-05-12 NOTE — Telephone Encounter (Signed)
Hospital discontinued medication, please advise

## 2018-05-15 DIAGNOSIS — M25561 Pain in right knee: Secondary | ICD-10-CM | POA: Diagnosis not present

## 2018-05-15 DIAGNOSIS — M1711 Unilateral primary osteoarthritis, right knee: Secondary | ICD-10-CM | POA: Diagnosis not present

## 2018-05-15 DIAGNOSIS — Z96651 Presence of right artificial knee joint: Secondary | ICD-10-CM | POA: Diagnosis not present

## 2018-05-15 DIAGNOSIS — M6281 Muscle weakness (generalized): Secondary | ICD-10-CM | POA: Diagnosis not present

## 2018-05-17 DIAGNOSIS — M25561 Pain in right knee: Secondary | ICD-10-CM | POA: Diagnosis not present

## 2018-05-17 DIAGNOSIS — M6281 Muscle weakness (generalized): Secondary | ICD-10-CM | POA: Diagnosis not present

## 2018-05-17 DIAGNOSIS — M1711 Unilateral primary osteoarthritis, right knee: Secondary | ICD-10-CM | POA: Diagnosis not present

## 2018-05-19 DIAGNOSIS — M1711 Unilateral primary osteoarthritis, right knee: Secondary | ICD-10-CM | POA: Diagnosis not present

## 2018-05-19 DIAGNOSIS — Z96651 Presence of right artificial knee joint: Secondary | ICD-10-CM | POA: Diagnosis not present

## 2018-05-19 DIAGNOSIS — M6281 Muscle weakness (generalized): Secondary | ICD-10-CM | POA: Diagnosis not present

## 2018-05-19 DIAGNOSIS — M25561 Pain in right knee: Secondary | ICD-10-CM | POA: Diagnosis not present

## 2018-05-23 DIAGNOSIS — Z96651 Presence of right artificial knee joint: Secondary | ICD-10-CM | POA: Diagnosis not present

## 2018-05-23 DIAGNOSIS — M25561 Pain in right knee: Secondary | ICD-10-CM | POA: Diagnosis not present

## 2018-05-23 DIAGNOSIS — M6281 Muscle weakness (generalized): Secondary | ICD-10-CM | POA: Diagnosis not present

## 2018-05-23 DIAGNOSIS — M1711 Unilateral primary osteoarthritis, right knee: Secondary | ICD-10-CM | POA: Diagnosis not present

## 2018-05-25 DIAGNOSIS — M25561 Pain in right knee: Secondary | ICD-10-CM | POA: Diagnosis not present

## 2018-05-25 DIAGNOSIS — M1711 Unilateral primary osteoarthritis, right knee: Secondary | ICD-10-CM | POA: Diagnosis not present

## 2018-05-25 DIAGNOSIS — Z96651 Presence of right artificial knee joint: Secondary | ICD-10-CM | POA: Diagnosis not present

## 2018-05-25 DIAGNOSIS — M6281 Muscle weakness (generalized): Secondary | ICD-10-CM | POA: Diagnosis not present

## 2018-05-30 DIAGNOSIS — M6281 Muscle weakness (generalized): Secondary | ICD-10-CM | POA: Diagnosis not present

## 2018-05-30 DIAGNOSIS — Z96651 Presence of right artificial knee joint: Secondary | ICD-10-CM | POA: Diagnosis not present

## 2018-05-30 DIAGNOSIS — M1711 Unilateral primary osteoarthritis, right knee: Secondary | ICD-10-CM | POA: Diagnosis not present

## 2018-05-30 DIAGNOSIS — M25561 Pain in right knee: Secondary | ICD-10-CM | POA: Diagnosis not present

## 2018-06-01 ENCOUNTER — Telehealth: Payer: Self-pay | Admitting: Internal Medicine

## 2018-06-01 DIAGNOSIS — M6281 Muscle weakness (generalized): Secondary | ICD-10-CM | POA: Diagnosis not present

## 2018-06-01 DIAGNOSIS — Z96651 Presence of right artificial knee joint: Secondary | ICD-10-CM | POA: Diagnosis not present

## 2018-06-01 DIAGNOSIS — M25561 Pain in right knee: Secondary | ICD-10-CM | POA: Diagnosis not present

## 2018-06-01 DIAGNOSIS — M1711 Unilateral primary osteoarthritis, right knee: Secondary | ICD-10-CM | POA: Diagnosis not present

## 2018-06-01 NOTE — Telephone Encounter (Signed)
Please advise 

## 2018-06-01 NOTE — Telephone Encounter (Signed)
Ok to move f/u appt 2 mg is the max dose of Xanax I'm comfortable using Thx

## 2018-06-01 NOTE — Telephone Encounter (Signed)
Copied from [place] [phone]. Topic: Quick Communication - See Telephone Encounter >> Jun 01, 2018 11:44 AM Ahmed Prima L wrote: CRM for notification. See Telephone encounter for: 06/01/18.  Pt states she just started her amphetamine-dextroamphetamine (ADDERALL XR) 5 MG 24 hr capsule on January 15th because she had knee replacement on 12/23. Her surgeon advised her to wait a few weeks to start it after the surgery. She was suppose to come back in for a 3 month follow up around 2/27 but since she just started, does Dr Alain Marion want her to wait a little longer? Also, her alprazolam (XANAX) 2 MG tablet is only letting her sleep about 3-4 hours a night. She thinks the quantity should be increased for her to take at bedtime. Please Advise.

## 2018-06-02 NOTE — Telephone Encounter (Signed)
Pt calling back because she thought her amphetamine-dextroamphetamine (ADDERALL XR) 5 MG 24 hr capsule  Pt states she did not start this until 05/07/2018 and that is why she is just now calling for the refill.  [id number] [place] [place] [address]. AT [address] [place] [phone] (Phone) [phone] (Fax)

## 2018-06-02 NOTE — Telephone Encounter (Signed)
Pt notified and would like to know if there is something OTC she can take with her Xanax to help her sleep or a different medication. She states since starting her Adderall she been going only about 3 hours of sleep at night and she is taking her Adderall at 6-7AM.

## 2018-06-03 NOTE — Telephone Encounter (Signed)
We can stop Adderall due to insomnia Thx

## 2018-06-05 DIAGNOSIS — H43813 Vitreous degeneration, bilateral: Secondary | ICD-10-CM | POA: Diagnosis not present

## 2018-06-05 DIAGNOSIS — H33302 Unspecified retinal break, left eye: Secondary | ICD-10-CM | POA: Diagnosis not present

## 2018-06-05 DIAGNOSIS — H43392 Other vitreous opacities, left eye: Secondary | ICD-10-CM | POA: Diagnosis not present

## 2018-06-05 DIAGNOSIS — H4311 Vitreous hemorrhage, right eye: Secondary | ICD-10-CM | POA: Diagnosis not present

## 2018-06-05 DIAGNOSIS — H33311 Horseshoe tear of retina without detachment, right eye: Secondary | ICD-10-CM | POA: Diagnosis not present

## 2018-06-06 NOTE — Telephone Encounter (Signed)
Pt stated the Adderall has help her throughout the day. She use to be on the Ambien 2mg  TID and wanted to know if she could go back to that or atleast BID.

## 2018-06-09 NOTE — Telephone Encounter (Signed)
Is she taking Ambien TID?! Thx

## 2018-06-12 ENCOUNTER — Other Ambulatory Visit: Payer: Self-pay | Admitting: Internal Medicine

## 2018-06-12 NOTE — Telephone Encounter (Signed)
Sorry I meant to state she use to take Xanax TID not Ambien.

## 2018-06-12 NOTE — Telephone Encounter (Signed)
Copied from [place] [id number]. Topic: Quick Communication - Rx Refill/Question >> Jun 12, 2018  1:28 PM Windy Kalata wrote: Medication:amphetamine-dextroamphetamine (ADDERALL XR) 5 MG 24 hr capsule  Has the patient contacted their pharmacy? Yes.   (Agent: If no, request that the patient contact the pharmacy for the refill.) (Agent: If yes, when and what did the pharmacy advise?) Call office she would like to continue taking this medication  [id number] [place], [place] [address]. AT [address] [phone] (Phone) [phone] (Fax)   Preferred Pharmacy (with phone number or street name):   Agent: Please be advised that RX refills may take up to 3 business days. We ask that you follow-up with your pharmacy.

## 2018-06-13 DIAGNOSIS — M25561 Pain in right knee: Secondary | ICD-10-CM | POA: Diagnosis not present

## 2018-06-13 DIAGNOSIS — M6281 Muscle weakness (generalized): Secondary | ICD-10-CM | POA: Diagnosis not present

## 2018-06-13 DIAGNOSIS — M1711 Unilateral primary osteoarthritis, right knee: Secondary | ICD-10-CM | POA: Diagnosis not present

## 2018-06-13 DIAGNOSIS — Z96651 Presence of right artificial knee joint: Secondary | ICD-10-CM | POA: Diagnosis not present

## 2018-06-14 ENCOUNTER — Other Ambulatory Visit: Payer: Self-pay

## 2018-06-14 MED ORDER — AMPHETAMINE-DEXTROAMPHET ER 5 MG PO CP24: 5.0000 mg | ORAL_CAPSULE | Freq: Every day | ORAL | 0 refills | Status: DC

## 2018-06-19 NOTE — Telephone Encounter (Signed)
Noted! Thank you

## 2018-06-20 MED ORDER — AMPHETAMINE-DEXTROAMPHET ER 5 MG PO CP24: 5.0000 mg | ORAL_CAPSULE | Freq: Every day | ORAL | 0 refills | Status: DC

## 2018-06-26 DIAGNOSIS — H4311 Vitreous hemorrhage, right eye: Secondary | ICD-10-CM | POA: Diagnosis not present

## 2018-06-26 DIAGNOSIS — H33311 Horseshoe tear of retina without detachment, right eye: Secondary | ICD-10-CM | POA: Diagnosis not present

## 2018-06-28 ENCOUNTER — Telehealth: Payer: Self-pay | Admitting: Internal Medicine

## 2018-06-28 NOTE — Telephone Encounter (Signed)
Copied from [place] [phone]. Topic: Quick Communication - See Telephone Encounter >> Jun 28, 2018  2:46 PM Bea Graff, NT wrote: CRM for notification. See Telephone encounter for: 06/28/18. Pt states that her insurance company states that the [facility] needs a PA before insurance will cover this med. Phone#: [phone]

## 2018-07-07 ENCOUNTER — Other Ambulatory Visit: Payer: Self-pay | Admitting: Internal Medicine

## 2018-07-11 NOTE — Telephone Encounter (Signed)
Medication was discontinued at ED

## 2018-07-18 ENCOUNTER — Other Ambulatory Visit: Payer: Self-pay | Admitting: Internal Medicine

## 2018-07-27 ENCOUNTER — Other Ambulatory Visit: Payer: Self-pay | Admitting: Internal Medicine

## 2018-07-27 NOTE — Telephone Encounter (Signed)
Requested medication (s) are due for refill today:yes  Requested medication (s) are on the active medication list: yes  Last refill:  06/14/2018  Future visit scheduled:  No; last office visit 03/29/18  Notes to clinic:  Not delegated    Requested Prescriptions  Pending Prescriptions Disp Refills   amphetamine-dextroamphetamine (ADDERALL XR) 5 MG 24 hr capsule 30 capsule 0    Sig: Take 1 capsule (5 mg total) by mouth daily.     Not Delegated - Psychiatry:  Stimulants/ADHD Failed - 07/27/2018 11:01 AM      Failed - This refill cannot be delegated      Failed - Urine Drug Screen completed in last 360 days.      Failed - Valid encounter within last 3 months    Recent Outpatient Visits          4 months ago Preoperative clearance   [place] [facility], Evie Lacks, MD   1 year ago Need for influenza vaccination   [place], Evie Lacks, MD   2 years ago [facility] [place], Evie Lacks, MD   2 years ago Well adult exam   [place] [facility], Evie Lacks, MD   3 years ago Left upper arm pain   [facility], Evie Lacks, MD

## 2018-07-30 MED ORDER — AMPHETAMINE-DEXTROAMPHET ER 5 MG PO CP24: 5.0000 mg | ORAL_CAPSULE | Freq: Every day | ORAL | 0 refills | Status: DC

## 2018-07-30 NOTE — Telephone Encounter (Signed)
We schedule an office visit

## 2018-09-20 ENCOUNTER — Other Ambulatory Visit: Payer: Self-pay | Admitting: Internal Medicine

## 2018-10-02 DIAGNOSIS — H43392 Other vitreous opacities, left eye: Secondary | ICD-10-CM | POA: Diagnosis not present

## 2018-10-02 DIAGNOSIS — H31091 Other chorioretinal scars, right eye: Secondary | ICD-10-CM | POA: Diagnosis not present

## 2018-10-02 DIAGNOSIS — H4311 Vitreous hemorrhage, right eye: Secondary | ICD-10-CM | POA: Diagnosis not present

## 2018-10-02 DIAGNOSIS — H43813 Vitreous degeneration, bilateral: Secondary | ICD-10-CM | POA: Diagnosis not present

## 2018-10-06 ENCOUNTER — Other Ambulatory Visit: Payer: Self-pay

## 2018-10-09 NOTE — Telephone Encounter (Signed)
Pt would [facility] to know [facility]  [id number] [place] - [address]. AT [address] [place] [phone] (Phone) [phone] (Fax)

## 2018-10-10 MED ORDER — BUTALBITAL-APAP-CAFFEINE 50-325-40 MG PO TABS: ORAL_TABLET | ORAL | 1 refills | Status: DC

## 2018-11-17 DIAGNOSIS — Z20828 Contact with and (suspected) exposure to other viral communicable diseases: Secondary | ICD-10-CM | POA: Diagnosis not present

## 2019-01-16 ENCOUNTER — Other Ambulatory Visit: Payer: Self-pay | Admitting: Internal Medicine

## 2019-01-16 ENCOUNTER — Other Ambulatory Visit: Payer: Self-pay | Admitting: *Deleted

## 2019-01-16 NOTE — Telephone Encounter (Signed)
Medication Refill - Medication:   butalbital-acetaminophen-caffeine (FIORICET) 50-325-40 MG tablet   Pt stated she requested refill through pharmacy a week ago and the pharmacy has made several attempts for refill with no result. Pt is out of medication and would like to know if Dr. Alain Marion would send a 10 day supply [facility] and her regular rx to [facility]. Please advise.  Has the patient contacted their pharmacy? Yes.   (Agent: If no, request that the patient contact the pharmacy for the refill.) (Agent: If yes, when and what did the pharmacy advise?)  Preferred Pharmacy (with phone number or street name):  [id number] [place], [place]. AT [address] [place] [phone] (Phone) [phone] (Fax)     Agent: Please be advised that RX refills may take up to 3 business days. We ask that you follow-up with your pharmacy.

## 2019-01-16 NOTE — Telephone Encounter (Signed)
Requested medication (s) are due for refill today: yes   Requested medication (s) are on the active medication list: yes Last refill: 10/21/2018  Future visit scheduled: no  Notes to clinic: Patient would like 10 pills seen to local pharmacy and original script sent to mail order   Requested Prescriptions  Pending Prescriptions Disp Refills   butalbital-acetaminophen-caffeine (FIORICET) 50-325-40 MG tablet 90 tablet 1    Sig: TAKE 1 TABLET EVERY 6 HOURSAS NEEDED FOR HEADACHE     Not Delegated - Analgesics:  Non-Opioid Analgesic Combinations Failed - 01/16/2019 10:06 AM      Failed - This refill cannot be delegated      Passed - Valid encounter within last 12 months    Recent Outpatient Visits          9 months ago Preoperative clearance   [place], Evie Lacks, MD   1 year ago Need for influenza vaccination   [place], Evie Lacks, MD   2 years ago [facility] [place], Evie Lacks, MD   2 years ago Well adult exam   [place] [facility], Evie Lacks, MD   3 years ago Left upper arm pain   [facility], Evie Lacks, MD

## 2019-01-19 MED ORDER — BUTALBITAL-APAP-CAFFEINE 50-325-40 MG PO TABS: ORAL_TABLET | ORAL | 0 refills | Status: DC

## 2019-02-14 ENCOUNTER — Other Ambulatory Visit: Payer: Self-pay | Admitting: Internal Medicine

## 2019-02-24 ENCOUNTER — Other Ambulatory Visit: Payer: Self-pay | Admitting: Internal Medicine

## 2019-02-25 ENCOUNTER — Other Ambulatory Visit: Payer: Self-pay | Admitting: Internal Medicine

## 2019-02-26 NOTE — Telephone Encounter (Signed)
[place] Controlled Database Checked Last filled: 12/28/18 # 90 LOV w/you: 03/29/18 Next appt w/you: 03/27/19

## 2019-03-05 ENCOUNTER — Telehealth: Payer: Self-pay | Admitting: Internal Medicine

## 2019-03-05 NOTE — Telephone Encounter (Signed)
Pt is scheduled for a CPE on 11.24.20 an that was the soonest appt available / Pt would like to know if she can have refills for the [facility]s [facility]/ Pt has one pill left of the Fioricet / please advise   alprazolam (XANAX) 2 MG tablet  butalbital-acetaminophen-caffeine (FIORICET) 50-325-40 MG tablet   [id number] [place], [place] [address]. AT [address] [place] [phone] (Phone) [phone] (Fax

## 2019-03-08 NOTE — Telephone Encounter (Signed)
Patient states she has no availability before that schedule. She is requesting a partial refill to last until scheduled appointment.

## 2019-03-08 NOTE — Telephone Encounter (Signed)
Patient is scheduled 11/24 and cannt be seen before then and would like a partial refill on her Xanax and Fioricet.   Please advise

## 2019-03-09 MED ORDER — ALPRAZOLAM 2 MG PO TABS: ORAL_TABLET | ORAL | 0 refills | Status: DC

## 2019-03-09 MED ORDER — BUTALBITAL-APAP-CAFFEINE 50-325-40 MG PO TABS: ORAL_TABLET | ORAL | 0 refills | Status: DC

## 2019-03-09 NOTE — Telephone Encounter (Signed)
Ok Thx 

## 2019-03-09 NOTE — Addendum Note (Signed)
Addended by: Cassandria Anger on: 03/09/2019 09:12 AM   Modules accepted: Orders

## 2019-03-27 ENCOUNTER — Other Ambulatory Visit: Payer: Self-pay

## 2019-03-27 ENCOUNTER — Ambulatory Visit (INDEPENDENT_AMBULATORY_CARE_PROVIDER_SITE_OTHER): Payer: BC Managed Care – PPO | Admitting: Internal Medicine

## 2019-03-27 ENCOUNTER — Encounter: Payer: Self-pay | Admitting: Internal Medicine

## 2019-03-27 ENCOUNTER — Other Ambulatory Visit (INDEPENDENT_AMBULATORY_CARE_PROVIDER_SITE_OTHER): Payer: BC Managed Care – PPO

## 2019-03-27 VITALS — BP 128/82 | HR 88 | Temp 98.3°F | Ht 62.0 in | Wt 170.0 lb

## 2019-03-27 DIAGNOSIS — Z1211 Encounter for screening for malignant neoplasm of colon: Secondary | ICD-10-CM

## 2019-03-27 DIAGNOSIS — Z0000 Encounter for general adult medical examination without abnormal findings: Secondary | ICD-10-CM

## 2019-03-27 DIAGNOSIS — R300 Dysuria: Secondary | ICD-10-CM | POA: Diagnosis not present

## 2019-03-27 DIAGNOSIS — E739 Lactose intolerance, unspecified: Secondary | ICD-10-CM | POA: Diagnosis not present

## 2019-03-27 DIAGNOSIS — I10 Essential (primary) hypertension: Secondary | ICD-10-CM

## 2019-03-27 DIAGNOSIS — F341 Dysthymic disorder: Secondary | ICD-10-CM

## 2019-03-27 LAB — LIPID PANEL
Cholesterol: 217 mg/dL — ABNORMAL HIGH (ref 0–200)
HDL: 44.1 mg/dL (ref >39.00)
NonHDL: 173.17
Total CHOL/HDL Ratio: 5
Triglycerides: 285 mg/dL — ABNORMAL HIGH (ref 0.0–149.0)
VLDL: 57 mg/dL — ABNORMAL HIGH (ref 0.0–40.0)

## 2019-03-27 LAB — URINALYSIS, ROUTINE W REFLEX MICROSCOPIC
Bilirubin Urine: NEGATIVE
Ketones, ur: NEGATIVE
Leukocytes,Ua: NEGATIVE
Nitrite: NEGATIVE
Specific Gravity, Urine: >=1.03 — AB (ref 1.000–1.030)
Total Protein, Urine: NEGATIVE
Urine Glucose: NEGATIVE
Urobilinogen, UA: 0.2 (ref 0.0–1.0)
pH: 5.5 (ref 5.0–8.0)

## 2019-03-27 LAB — CBC WITH DIFFERENTIAL/PLATELET
Basophils Absolute: 0.1 10*3/uL (ref 0.0–0.1)
Basophils Relative: 1.5 % (ref 0.0–3.0)
Eosinophils Absolute: 0.2 10*3/uL (ref 0.0–0.7)
Eosinophils Relative: 3.5 % (ref 0.0–5.0)
HCT: 40.9 % (ref 36.0–46.0)
Hemoglobin: 13.8 g/dL (ref 12.0–15.0)
Lymphocytes Relative: 32.1 % (ref 12.0–46.0)
Lymphs Abs: 1.8 10*3/uL (ref 0.7–4.0)
MCHC: 33.7 g/dL (ref 30.0–36.0)
MCV: 87.9 fl (ref 78.0–100.0)
Monocytes Absolute: 0.7 10*3/uL (ref 0.1–1.0)
Monocytes Relative: 11.9 % (ref 3.0–12.0)
Neutro Abs: 2.9 10*3/uL (ref 1.4–7.7)
Neutrophils Relative %: 51 % (ref 43.0–77.0)
Platelets: 274 10*3/uL (ref 150.0–400.0)
RBC: 4.65 Mil/uL (ref 3.87–5.11)
RDW: 13.2 % (ref 11.5–15.5)
WBC: 5.7 10*3/uL (ref 4.0–10.5)

## 2019-03-27 LAB — HEPATIC FUNCTION PANEL
ALT: 15 U/L (ref 0–35)
AST: 17 U/L (ref 0–37)
Albumin: 3.8 g/dL (ref 3.5–5.2)
Alkaline Phosphatase: 107 U/L (ref 39–117)
Bilirubin, Direct: 0 mg/dL (ref 0.0–0.3)
Total Bilirubin: 0.4 mg/dL (ref 0.2–1.2)
Total Protein: 7.2 g/dL (ref 6.0–8.3)

## 2019-03-27 LAB — LDL CHOLESTEROL, DIRECT: Direct LDL: 88 mg/dL

## 2019-03-27 LAB — BASIC METABOLIC PANEL
BUN: 13 mg/dL (ref 6–23)
CO2: 27 mEq/L (ref 19–32)
Calcium: 9 mg/dL (ref 8.4–10.5)
Chloride: 102 mEq/L (ref 96–112)
Creatinine, Ser: 0.59 mg/dL (ref 0.40–1.20)
GFR: 101.77 mL/min (ref >60.00)
Glucose, Bld: 92 mg/dL (ref 70–99)
Potassium: 3.5 mEq/L (ref 3.5–5.1)
Sodium: 138 mEq/L (ref 135–145)

## 2019-03-27 LAB — VITAMIN D 25 HYDROXY (VIT D DEFICIENCY, FRACTURES): VITD: 21.92 ng/mL — ABNORMAL LOW (ref 30.00–100.00)

## 2019-03-27 LAB — VITAMIN B12: Vitamin B-12: 288 pg/mL (ref 211–911)

## 2019-03-27 LAB — TSH: TSH: 1.59 u[IU]/mL (ref 0.35–4.50)

## 2019-03-27 MED ORDER — VITAMIN D3 1.25 MG (50000 UT) PO CAPS: 1.0000 | ORAL_CAPSULE | ORAL | 3 refills | Status: DC

## 2019-03-27 MED ORDER — ALPRAZOLAM 2 MG PO TABS: ORAL_TABLET | ORAL | 1 refills | Status: DC

## 2019-03-27 NOTE — Assessment & Plan Note (Signed)
  On diet  

## 2019-03-27 NOTE — Assessment & Plan Note (Signed)
UA

## 2019-03-27 NOTE — Progress Notes (Signed)
Subjective:  Patient ID: Julia Zimmerman, female    DOB: 12-May-1950  Age: 68 y.o. MRN: [id number]  CC: No chief complaint on file.   HPI PHARA CANTEY presents for a well exam F/u anxiety C/o dysuria x 2 d  Outpatient Medications Prior to Visit  Medication Sig Dispense Refill  . alprazolam (XANAX) 2 MG tablet TAKE 1 TABLET BY MOUTH AT BEDTIME AS NEEDED FOR INSOMNIA. 30 tablet 0  . amphetamine-dextroamphetamine (ADDERALL XR) 5 MG 24 hr capsule Take 1 capsule (5 mg total) by mouth daily. 30 capsule 0  . aspirin EC 325 MG EC tablet 1 tab a day for the next 30 days to prevent blood clots 30 tablet 0  . butalbital-acetaminophen-caffeine (FIORICET) 50-325-40 MG tablet TAKE 1 TABLET EVERY 8 HOURSAS NEEDED FOR HEADACHE 90 tablet 0  . Cholecalciferol (VITAMIN D3) 2000 units capsule Take 1 capsule (2,000 Units total) daily by mouth. 100 capsule 3  . Cholecalciferol (VITAMIN D3) 50000 units CAPS Take 1 capsule once a week by mouth. 8 capsule 0  . diphenhydrAMINE (BENADRYL) 25 MG tablet Take 25 mg by mouth daily as needed for allergies.    [name] docusate sodium (COLACE) 100 MG capsule 1 tab 2 times a day while on narcotics.  STOOL SOFTENER 60 capsule 0  . gabapentin (NEURONTIN) 300 MG capsule 1 po qhs for nerve pain 30 capsule 0  . losartan (COZAAR) 50 MG tablet TAKE 1 TABLET DAILY 90 tablet 3  . omeprazole (PRILOSEC) 20 MG capsule Take 1 capsule (20 mg total) by mouth daily. (Patient taking differently: Take 20 mg by mouth daily as needed (heartburn). ) 30 capsule 11  . ondansetron (ZOFRAN) 4 MG tablet Take 1 tablet (4 mg total) by mouth every 6 (six) hours as needed for nausea. 20 tablet 0  . oxyCODONE (OXY IR/ROXICODONE) 5 MG immediate release tablet 1 po q 4 hrs prn pain.  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 polyethylene glycol (MIRALAX / GLYCOLAX) packet 17grams in 6 oz of something to drink twice a day until bowel movement.  LAXITIVE.  Restart if two days since  last bowel movement 14 each 0  . venlafaxine XR (EFFEXOR-XR) 150 MG 24 hr capsule TAKE 1 CAPSULE DAILY WITH  BREAKFAST. 90 capsule 3   No facility-administered medications prior to visit.     ROS: Review of Systems  Constitutional: Positive for unexpected weight change. Negative for activity change, appetite change and chills.  HENT: Negative for congestion, mouth sores and sinus pressure.   Eyes: Negative for visual disturbance.  Respiratory: Negative for cough and chest tightness.   Gastrointestinal: Negative for abdominal pain and nausea.  Genitourinary: Negative for difficulty urinating, frequency and vaginal pain.  Musculoskeletal: Negative for back pain and gait problem.  Skin: Negative for pallor and rash.  Neurological: Negative for dizziness, tremors, weakness, numbness and headaches.  Psychiatric/Behavioral: Negative for confusion, sleep disturbance and suicidal ideas.    Objective:  BP 128/82 (BP Location: Left Arm, Patient Position: Sitting, Cuff Size: Large)   Pulse 88   Temp 98.3 F (36.8 C) (Oral)   Ht 5\' 2"  (1.575 m)   Wt 170 lb (77.1 kg)   SpO2 98%   BMI 31.09 kg/m   BP Readings from Last 3 Encounters:  03/27/19 128/82  04/26/18 129/86  04/14/18 133/80    Wt Readings from Last 3 Encounters:  03/27/19 170 lb (77.1 kg)  04/24/18 165 lb (74.8 kg)  04/14/18  164 lb 6.4 oz (74.6 kg)    Physical Exam Constitutional:      General: She is not in acute distress.    Appearance: She is well-developed. She is obese.  HENT:     Head: Normocephalic.     Right Ear: External ear normal.     Left Ear: External ear normal.     Nose: Nose normal.  Eyes:     General:        Right eye: No discharge.        Left eye: No discharge.     Conjunctiva/sclera: Conjunctivae normal.     Pupils: Pupils are equal, round, and reactive to light.  Neck:     Musculoskeletal: Normal range of motion and neck supple.     Thyroid: No thyromegaly.     Vascular: No JVD.      Trachea: No tracheal deviation.  Cardiovascular:     Rate and Rhythm: Normal rate and regular rhythm.     Heart sounds: Normal heart sounds.  Pulmonary:     Effort: No respiratory distress.     Breath sounds: No stridor. No wheezing.  Abdominal:     General: Bowel sounds are normal. There is no distension.     Palpations: Abdomen is soft. There is no mass.     Tenderness: There is no abdominal tenderness. There is no guarding or rebound.  Musculoskeletal:        General: No tenderness.  Lymphadenopathy:     Cervical: No cervical adenopathy.  Skin:    Findings: No erythema or rash.  Neurological:     Cranial Nerves: No cranial nerve deficit.     Motor: No abnormal muscle tone.     Coordination: Coordination normal.     Deep Tendon Reflexes: Reflexes normal.  Psychiatric:        Behavior: Behavior normal.        Thought Content: Thought content normal.        Judgment: Judgment normal.     Lab Results  Component Value Date   WBC 13.7 (H) 04/26/2018   HGB 11.1 (L) 04/26/2018   HCT 34.8 (L) 04/26/2018   PLT 268 04/26/2018   GLUCOSE 129 (H) 04/26/2018   CHOL 304 (H) 03/29/2018   TRIG 238.0 (H) 03/29/2018   HDL 45.70 03/29/2018   LDLDIRECT 172.0 03/29/2018   LDLCALC 71 11/22/2012   ALT 23 04/14/2018   AST 23 04/14/2018   NA 137 04/26/2018   K 4.3 04/26/2018   CL 100 04/26/2018   CREATININE 0.71 04/26/2018   BUN 9 04/26/2018   CO2 28 04/26/2018   TSH 2.14 03/29/2018   INR 0.97 04/14/2018    Ct Cardiac Scoring  Addendum Date: 04/17/2018   ADDENDUM REPORT: 04/17/2018 16:49 CLINICAL DATA:  Risk stratification EXAM: Coronary Calcium Score TECHNIQUE: The patient was scanned on a Siemens Somatom 64 slice scanner. Axial non-contrast 3 mm slices were carried out through the heart. The data set was analyzed on a dedicated work station and scored using the [place]. FINDINGS: Non-cardiac: See separate report from [facility]. Ascending aorta: Normal diameter 3.2  cm Pericardium: Normal Coronary arteries: Calcium noted in proximal and mid RCA/LAD IMPRESSION: Coronary calcium score of 97. This was 60 st percentile for age and sex matched control. Jenkins Rouge Electronically Signed   By: Jenkins Rouge M.D.   On: 04/17/2018 16:49   Result Date: 04/17/2018 EXAM: OVER-READ INTERPRETATION  CT CHEST The following report is an over-read performed by  radiologist Dr. Suzy Bouchard of [facility] on 04/17/2018. This over-read does not include interpretation of cardiac or coronary anatomy or pathology. The coronary calcium score interpretation by the cardiologist is attached. COMPARISON:  CT chest 03/18/2009 FINDINGS: Limited view of the lung parenchyma demonstrates mild atelectasis and pleural thickening in the medial RIGHT middle lobe similar to CT 05/18/2008. There is a nodular focus adjacent to the atelectasis measuring 6 mm which is not clearly seen on prior (image 19/4 and image 43/6). Airways are normal. Limited view of the mediastinum demonstrates no adenopathy. Esophagus normal. Limited view of the upper abdomen unremarkable. Limited view of the skeleton and chest wall is unremarkable. IMPRESSION: 1. New nodular thickening adjacent to chronic atelectasis in RIGHT middle lobe. In patient with smoking history recommend follow-up CT exam. Non-contrast chest CT at 6-12 months is recommended. If the nodule is stable at time of repeat CT, then future CT at 18-24 months (from today's scan) is considered optional for low-risk patients, but is recommended for high-risk patients. This recommendation follows the consensus statement: Guidelines for Management of Incidental Pulmonary Nodules Detected on CT Images: From the Fleischner Society 2017; Radiology 2017; 284:228-243. 2. No additional significant extracardiac findings. These results will be called to the ordering clinician or representative by the Radiologist Assistant, and communication documented in the PACS or  zV[employer] Dashboard. Electronically Signed: By: Suzy Bouchard M.D. On: 04/17/2018 16:14    Assessment & Plan:   There are no diagnoses linked to this encounter.   No orders of the defined types were placed in this encounter.    Follow-up: No follow-ups on file.  Walker Kehr, MD

## 2019-03-27 NOTE — Patient Instructions (Signed)
These suggestions will probably help you to improve your metabolism if you are not overweight and to lose weight if you are overweight: 1.  Reduce your consumption of sugars and starches.  Eliminate high fructose corn syrup from your diet.  Reduce your consumption of processed foods.  For desserts try to have seasonal fruits, berries, nuts, cheeses or dark chocolate with more than 70% cacao. 2.  Do not snack 3.  You do not have to eat breakfast.  If you choose to have breakfast-eat plain greek yogurt, eggs, oatmeal (without sugar) 4.  Drink water, freshly brewed unsweetened tea (green, black or herbal) or coffee.  Do not drink sodas including diet sodas , juices, beverages sweetened with artificial sweeteners. 5.  Reduce your consumption of refined grains. 6.  Avoid protein drinks such as Optifast, Slim fast etc. Eat chicken, fish, meat, dairy and beans for your sources of protein 7.  Natural unprocessed fats like cold pressed virgin olive oil, butter, coconut oil are good for you.  Eat avocados 8.  Increase your consumption of fiber.  Fruits, berries, vegetables, whole grains, flaxseeds, Chia seeds, beans, popcorn, nuts, oatmeal are good sources of fiber 9.  Use vinegar in your diet, i.e. apple cider vinegar, red wine or balsamic vinegar 10.  You can try fasting.  For example you can skip breakfast and lunch every other day (24-hour fast) 11.  Stress reduction, good night sleep, relaxation, meditation, yoga and other physical activity is likely to help you to maintain low weight too. 12.  If you drink alcohol, limit your alcohol intake to no more than 2 drinks a day.   Mediterranean diet is good for you. ([employer] has a typical Mediterranean cuisine menu) The Mediterranean diet is a way of eating based on the traditional cuisine of countries bordering the [employer]. While there is no single definition of the Mediterranean diet, it is typically high in vegetables, fruits, whole grains,  beans, nut and seeds, and olive oil. The main components of Mediterranean diet include:  Daily consumption of vegetables, fruits, whole grains and healthy fats   Weekly intake of fish, poultry, beans and eggs   Moderate portions of dairy products   Limited intake of red meat Other important elements of the Mediterranean diet are sharing meals with family and friends, enjoying a glass of red wine and being physically active. Health benefits of a Mediterranean diet: A traditional Mediterranean diet consisting of large quantities of fresh fruits and vegetables, nuts, fish and olive oil--coupled with physical activity--can reduce your risk of serious mental and physical health problems by: Preventing heart disease and strokes. Following a Mediterranean diet limits your intake of refined breads, processed foods, and red meat, and encourages drinking red wine instead of hard liquor--all factors that can help prevent heart disease and stroke. Keeping you agile. If youre an older adult, the nutrients gained with a Mediterranean diet may reduce your risk of developing muscle weakness and other signs of frailty by about 70 percent. Reducing the risk of Alzheimers. Research suggests that the [place] diet may improve cholesterol, blood sugar levels, and overall blood vessel health, which in turn may reduce your risk of Alzheimers disease or dementia. Halving the risk of Parkinsons disease. The high levels of antioxidants in the Mediterranean diet can prevent cells from undergoing a damaging process called oxidative stress, thereby cutting the risk of Parkinsons disease in half. Increasing longevity. By reducing your risk of developing heart disease or cancer with the Mediterranean diet,  youre reducing your risk of death at any age by 20%. Protecting against type 2 diabetes. A Mediterranean diet is rich in fiber which digests slowly, prevents huge swings in blood sugar, and can help you maintain a  healthy weight.    Cabbage soup recipe that will not make you gain weight: Take 1 small head of cabbage, 1 average pack of celery, 4 green peppers, 4 onions, 2 cans diced tomatoes (they are not available without salt), salt and spices to taste.  Chop cabbage, celery, peppers and onions.  And tomatoes and 2-2.5 liters (2.5 quarts) of water so that it would just cover the vegetables.  Bring to boil.  Add spices and salt.  Turn heat to low/medium and simmer for 20-25 minutes.  Naturally, you can make a smaller batch and change some of the ingredients.       Gluten free trial for 4-6 weeks. OK to use gluten-free bread and gluten-free pasta.    Gluten-Free Diet for Celiac Disease, Adult The gluten-free diet includes all foods that do not contain gluten. Gluten is a protein that is found in wheat, rye, barley, and some other grains. Following the gluten-free diet is the only treatment for people with celiac disease. It helps to prevent damage to the intestines and improves or eliminates the symptoms of celiac disease. Following the gluten-free diet requires some planning. It can be challenging at first, but it gets easier with time and practice. There are more gluten-free options available today than ever before. If you need help finding gluten-free foods or if you have questions, talk with your diet and nutrition specialist (registered dietitian) or your health care provider. What do I need to know about a gluten-free diet?  All fruits, vegetables, and meats are safe to eat and do not contain gluten.  When grocery shopping, start by shopping in the produce, meat, and dairy sections. These sections are more likely to contain gluten-free foods. Then move to the aisles that contain packaged foods if you need to.  Read all food labels. Gluten is often added to foods. Always check the ingredient list and look for warnings, such as may contain gluten."  Talk with your dietitian or health care  provider before taking a gluten-free multivitamin or mineral supplement.  Be aware of gluten-free foods having contact with foods that contain gluten (cross-contamination). This can happen at home and with any processed foods. ? Talk with your health care provider or dietitian about how to reduce the risk of cross-contamination in your home. ? If you have questions about how a food is processed, ask the manufacturer. What key words help to identify gluten? Foods that list any of these key words on the label usually contain gluten:  Wheat, flour, enriched flour, bromated flour, white flour, durum flour, graham flour, phosphated flour, self-rising flour, semolina, farina, barley (malt), rye, and oats.  Starch, dextrin, modified food starch, or cereal.  Thickening, fillers, or emulsifiers.  Malt flavoring, malt extract, or malt syrup.  Hydrolyzed vegetable protein.  In the [place] packaged foods that are gluten-free are required to be labeled GF. These foods should be easy to identify and are safe to eat. In the [place] food companies are also required to list common food allergens, including wheat, on their labels. Recommended foods Grains  Amaranth, bean flours, 100% buckwheat flour, corn, millet, nut flours or nut meals, GF oats, quinoa, rice, sorghum, teff, rice wafers, pure cornmeal tortillas, popcorn, and hot cereals made from cornmeal. Hominy, rice, wild  rice. Some Asian rice noodles or bean noodles. Arrowroot starch, corn bran, corn flour, corn germ, cornmeal, corn starch, potato flour, potato starch flour, and rice bran. Plain, brown, and sweet rice flours. Rice polish, soy flour, and tapioca starch. Vegetables  All plain fresh, frozen, and canned vegetables. Fruits  All plain fresh, frozen, canned, and dried fruits, and 100% fruit juices. Meats and other protein foods  All fresh beef, pork, poultry, fish, seafood, and eggs. Fish canned in water, oil, brine, or vegetable broth.  Plain nuts and seeds, peanut butter. Some lunch meat and some frankfurters. Dried beans, dried peas, and lentils. Dairy  Fresh plain, dry, evaporated, or condensed milk. Cream, butter, sour cream, whipping cream, and most yogurts. Unprocessed cheese, most processed cheeses, some cottage cheese, some cream cheeses. Beverages  Coffee, tea, most herbal teas. Carbonated beverages and some root beers. Wine, sake, and distilled spirits, such as gin, vodka, and whiskey. Most hard ciders. Fats and oils  Butter, margarine, vegetable oil, hydrogenated butter, olive oil, shortening, lard, cream, and some mayonnaise. Some commercial salad dressings. Olives. Sweets and desserts  Sugar, honey, some syrups, molasses, jelly, and jam. Plain hard candy, marshmallows, and gumdrops. Pure cocoa powder. Plain chocolate. Custard and some pudding mixes. Gelatin desserts, sorbets, frozen ice pops, and sherbet. Cake, cookies, and other desserts prepared with allowed flours. Some commercial ice creams. Cornstarch, tapioca, and rice puddings. Seasoning and other foods  Some canned or frozen soups. Monosodium glutamate (MSG). Cider, rice, and wine vinegar. Baking soda and baking powder. Cream of tartar. Baking and nutritional yeast. Certain soy sauces made without wheat (ask your dietitian about specific brands that are allowed). Nuts, coconut, and chocolate. Salt, pepper, herbs, spices, flavoring extracts, imitation or artificial flavorings, natural flavorings, and food colorings. Some medicines and supplements. Some lip glosses and other cosmetics. Rice syrups. The items listed may not be a complete list. Talk with your dietitian about what dietary choices are best for you. Foods to avoid Grains  Barley, bran, bulgur, couscous, cracked wheat, [place], farro, graham, malt, matzo, semolina, wheat germ, and all wheat and rye cereals including spelt and kamut. Cereals containing malt as a flavoring, such as rice cereal.  Noodles, spaghetti, macaroni, most packaged rice mixes, and all mixes containing wheat, rye, barley, or triticale. Vegetables  Most creamed vegetables and most vegetables canned in sauces. Some commercially prepared vegetables and salads. Fruits  Thickened or prepared fruits and some pie fillings. Some fruit snacks and fruit roll-ups. Meats and other protein foods  Any meat or meat alternative containing wheat, rye, barley, or gluten stabilizers. These are often marinated or packaged meats and lunch meats. Bread-containing products, such as Swiss steak, croquettes, meatballs, and meatloaf. Most tuna canned in vegetable broth and [place] with hydrolyzed vegetable protein (HVP) injected as part of the basting. [place]. Imitation fish. Eggs in sauces made from ingredients to avoid. Dairy  Commercial chocolate milk drinks and malted milk. Some non-dairy creamers. Any cheese product containing ingredients to avoid. Beverages  Certain cereal beverages. Beer, ale, malted milk, and some root beers. Some hard ciders. Some instant flavored coffees. Some herbal teas made with barley or with barley malt added. Fats and oils  Some commercial salad dressings. Sour cream containing modified food starch. Sweets and desserts  Some toffees. Chocolate-coated nuts (may be rolled in wheat flour) and some commercial candies and candy bars. Most cakes, cookies, donuts, pastries, and other baked goods. Some commercial ice cream. Ice cream cones. Commercially prepared mixes for cakes, cookies,  and other desserts. Bread pudding and other puddings thickened with flour. Products containing brown rice syrup made with barley malt enzyme. Desserts and sweets made with malt flavoring. Seasoning and other foods  Some curry powders, some dry seasoning mixes, some gravy extracts, some meat sauces, some ketchups, some prepared mustards, and horseradish. Certain soy sauces. Malt vinegar. Bouillon and bouillon cubes that contain  HVP. Some chip dips, and some chewing gum. Yeast extract. Brewers yeast. Caramel color. Some medicines and supplements. Some lip glosses and other cosmetics. The items listed may not be a complete list. Talk with your dietitian about what dietary choices are best for you. Summary  Gluten is a protein that is found in wheat, rye, barley, and some other grains. The gluten-free diet includes all foods that do not contain gluten.  If you need help finding gluten-free foods or if you have questions, talk with your diet and nutrition specialist (registered dietitian) or your health care provider.  Read all food labels. Gluten is often added to foods. Always check the ingredient list and look for warnings, such as may contain gluten." This information is not intended to replace advice given to you by your health care provider. Make sure you discuss any questions you have with your health care provider. Document Released: 04/19/2005 Document Revised: 02/02/2016 Document Reviewed: 02/02/2016 Elsevier Interactive Patient Education  2018 [employer].

## 2019-03-27 NOTE — Assessment & Plan Note (Signed)
On Effexor Xanax prn Doxepine at hs  Potential benefits of a long term benzodiazepines  use as well as potential risks  and complications were explained to the patient and were aknowledged.

## 2019-03-27 NOTE — Addendum Note (Signed)
Addended by: Karren Cobble on: 03/27/2019 09:31 AM   Modules accepted: Orders

## 2019-03-27 NOTE — Assessment & Plan Note (Addendum)
We discussed age appropriate health related issues, including available/recomended screening tests and vaccinations. We discussed a need for adhering to healthy diet and exercise. Labs were ordered to be later reviewed . All questions were answered. GYN/mammo q 12 mo - pt will sch in jan 2021 CT ca scoring info given prior Colonoscopy Dec '10, 2014-  due in 2020 - pt will sch in jan 2021 [place] - pt will sch in jan 2021

## 2019-03-28 ENCOUNTER — Telehealth: Payer: Self-pay

## 2019-03-28 NOTE — Telephone Encounter (Signed)
Copied from [place] [id number]. Topic: General - Other >> Mar 28, 2019 10:51 AM Celene Kras wrote: Reason for CRM: Pt called and is requesting to have the results for her UA. Please advise.

## 2019-03-29 LAB — CULTURE, URINE COMPREHENSIVE
MICRO NUMBER:: 1134447
SPECIMEN QUALITY:: ADEQUATE

## 2019-03-30 ENCOUNTER — Telehealth: Payer: Self-pay | Admitting: Internal Medicine

## 2019-03-30 ENCOUNTER — Other Ambulatory Visit: Payer: Self-pay | Admitting: Internal Medicine

## 2019-03-30 MED ORDER — B COMPLEX PO TABS: 1.0000 | ORAL_TABLET | Freq: Every day | ORAL | 3 refills | Status: DC

## 2019-03-30 MED ORDER — VITAMIN D3 1.25 MG (50000 UT) PO CAPS: 1.0000 | ORAL_CAPSULE | ORAL | 3 refills | Status: DC

## 2019-03-30 MED ORDER — CEPHALEXIN 500 MG PO CAPS: 500.0000 mg | ORAL_CAPSULE | Freq: Four times a day (QID) | ORAL | 0 refills | Status: DC

## 2019-03-30 NOTE — Telephone Encounter (Signed)
Patient is calling to request antibiotics and something for pain when she urinates, and something for a yeast infection.   Please advise Preferred Pharmacy-[facility] and [address].

## 2019-03-31 NOTE — Telephone Encounter (Signed)
Addressed via MyChart

## 2019-04-02 NOTE — Telephone Encounter (Signed)
Please advise 

## 2019-04-03 MED ORDER — FLUCONAZOLE 150 MG PO TABS: 150.0000 mg | ORAL_TABLET | Freq: Once | ORAL | 1 refills | Status: AC

## 2019-04-03 NOTE — Telephone Encounter (Signed)
Keflex Rx was emailed on 11/27; Diflucan Rx - today Thx

## 2019-04-10 ENCOUNTER — Other Ambulatory Visit: Payer: Self-pay | Admitting: Internal Medicine

## 2019-04-11 ENCOUNTER — Encounter: Payer: Self-pay | Admitting: Gastroenterology

## 2019-04-19 ENCOUNTER — Ambulatory Visit (AMBULATORY_SURGERY_CENTER): Payer: Self-pay

## 2019-04-19 ENCOUNTER — Other Ambulatory Visit: Payer: Self-pay

## 2019-04-19 VITALS — Temp 96.2°F | Ht 62.0 in | Wt 174.6 lb

## 2019-04-19 DIAGNOSIS — Z86010 Personal history of colonic polyps: Secondary | ICD-10-CM

## 2019-04-19 MED ORDER — NA SULFATE-K SULFATE-MG SULF 17.5-3.13-1.6 GM/177ML PO SOLN: 1.0000 | Freq: Once | ORAL | 0 refills | Status: AC

## 2019-04-19 NOTE — Progress Notes (Signed)
Denies allergies to eggs or soy products. Denies complication of anesthesia or sedation. Denies use of weight loss medication. Denies use of O2.   Emmi instructions given for colonoscopy.  Covid screening is scheduled for 05/14/18 @ 3:30 Pm. A 15.00 coupon for Suprep was given to the patient.

## 2019-04-30 NOTE — Telephone Encounter (Signed)
Billing/coding inquires go to Billing at [phone], the practices cannot alter coding.

## 2019-05-08 ENCOUNTER — Encounter: Payer: Self-pay | Admitting: Gastroenterology

## 2019-05-09 ENCOUNTER — Other Ambulatory Visit: Payer: Self-pay | Admitting: Internal Medicine

## 2019-05-15 ENCOUNTER — Other Ambulatory Visit: Payer: Self-pay | Admitting: Gastroenterology

## 2019-05-15 ENCOUNTER — Ambulatory Visit (INDEPENDENT_AMBULATORY_CARE_PROVIDER_SITE_OTHER): Payer: BC Managed Care – PPO

## 2019-05-15 DIAGNOSIS — Z1159 Encounter for screening for other viral diseases: Secondary | ICD-10-CM

## 2019-05-16 LAB — SARS CORONAVIRUS 2 (TAT 6-24 HRS): SARS Coronavirus 2: NEGATIVE

## 2019-05-18 ENCOUNTER — Ambulatory Visit (AMBULATORY_SURGERY_CENTER): Payer: 59 | Admitting: Gastroenterology

## 2019-05-18 ENCOUNTER — Encounter: Payer: Self-pay | Admitting: Gastroenterology

## 2019-05-18 ENCOUNTER — Other Ambulatory Visit: Payer: Self-pay

## 2019-05-18 VITALS — BP 104/56 | HR 72 | Temp 97.8°F | Resp 20 | Ht 62.0 in | Wt 174.6 lb

## 2019-05-18 DIAGNOSIS — D128 Benign neoplasm of rectum: Secondary | ICD-10-CM

## 2019-05-18 DIAGNOSIS — Z86010 Personal history of colonic polyps: Secondary | ICD-10-CM | POA: Diagnosis not present

## 2019-05-18 DIAGNOSIS — D123 Benign neoplasm of transverse colon: Secondary | ICD-10-CM

## 2019-05-18 MED ORDER — SODIUM CHLORIDE 0.9 % IV SOLN: 500.0000 mL | Freq: Once | INTRAVENOUS | Status: DC

## 2019-05-18 NOTE — Progress Notes (Signed)
Called to room to assist during endoscopic procedure.  Patient ID and intended procedure confirmed with present staff. Received instructions for my participation in the procedure from the performing physician.  

## 2019-05-18 NOTE — Progress Notes (Signed)
Report given to PACU, vss 

## 2019-05-18 NOTE — Op Note (Signed)
[facility] Patient Name: Julia Zimmerman Procedure Date: 05/18/2019 1:22 PM MRN: [id number] Endoscopist: Ladene Artist , MD Age: 69 Referring MD:  Date of Birth: [birth year] Gender: Female Account #: [id number] Procedure:                Colonoscopy Indications:              Surveillance: Personal history of adenomatous                            polyps on last colonoscopy > 5 years ago Medicines:                Monitored Anesthesia Care Procedure:                Pre-Anesthesia Assessment:                           - Prior to the procedure, a History and Physical                            was performed, and patient medications and                            allergies were reviewed. The patient's tolerance of                            previous anesthesia was also reviewed. The risks                            and benefits of the procedure and the sedation                            options and risks were discussed with the patient.                            All questions were answered, and informed consent                            was obtained. Prior Anticoagulants: The patient has                            taken no previous anticoagulant or antiplatelet                            agents. ASA Grade Assessment: II - A patient with                            mild systemic disease. After reviewing the risks                            and benefits, the patient was deemed in                            satisfactory condition to undergo the procedure.  After obtaining informed consent, the colonoscope                            was passed under direct vision. Throughout the                            procedure, the patient's blood pressure, pulse, and                            oxygen saturations were monitored continuously. The                            Colonoscope was introduced through the anus and                            advanced to the the  cecum, identified by                            appendiceal orifice and ileocecal valve. The                            ileocecal valve, appendiceal orifice, and rectum                            were photographed. The quality of the bowel                            preparation was good. The colonoscopy was performed                            without difficulty. The patient tolerated the                            procedure well. Scope In: 1:34:33 PM Scope Out: 1:48:50 PM Scope Withdrawal Time: 0 hours 11 minutes 53 seconds  Total Procedure Duration: 0 hours 14 minutes 17 seconds  Findings:                 The perianal and digital rectal examinations were                            normal.                           Two sessile polyps were found in the rectum and                            transverse colon. The polyps were 5 to 7 mm in                            size. These polyps were removed with a cold snare.                            Resection and retrieval were complete.  There was a medium-sized lipoma, 15 mm in diameter,                            in the transverse colon.                           Multiple small-mouthed diverticula were found in                            the left colon. There was evidence of diverticular                            spasm. Peri-diverticular erythema was seen. There                            was no evidence of diverticular bleeding.                           Internal hemorrhoids were found during                            retroflexion. The hemorrhoids were small and Grade                            I (internal hemorrhoids that do not prolapse).                           The exam was otherwise without abnormality on                            direct and retroflexion views. Complications:            No immediate complications. Estimated blood loss:                            None. Estimated Blood Loss:     Estimated blood  loss: none. Impression:               - Two 5 to 7 mm polyps in the rectum and in the                            transverse colon, removed with a cold snare.                            Resected and retrieved.                           - Medium-sized lipoma in the transverse colon.                           - Moderate diverticulosis in the left colon.                           - Internal hemorrhoids.                           -  The examination was otherwise normal on direct                            and retroflexion views. Recommendation:           - Repeat colonoscopy after studies are complete for                            surveillance based on pathology results.                           - Patient has a contact number available for                            emergencies. The signs and symptoms of potential                            delayed complications were discussed with the                            patient. Return to normal activities tomorrow.                            Written discharge instructions were provided to the                            patient.                           - High fiber diet.                           - Continue present medications.                           - Await pathology results. Ladene Artist, MD 05/18/2019 1:53:09 PM This report has been signed electronically.

## 2019-05-18 NOTE — Patient Instructions (Signed)
Please read handouts provided. Continue present medications. Await pathology results. High Fiber diet.      YOU HAD AN ENDOSCOPIC PROCEDURE TODAY AT [facility]:   Refer to the procedure report that was given to you for any specific questions about what was found during the examination.  If the procedure report does not answer your questions, please call your gastroenterologist to clarify.  If you requested that your care partner not be given the details of your procedure findings, then the procedure report has been included in a sealed envelope for you to review at your convenience later.  YOU SHOULD EXPECT: Some feelings of bloating in the abdomen. Passage of more gas than usual.  Walking can help get rid of the air that was put into your GI tract during the procedure and reduce the bloating. If you had a lower endoscopy (such as a colonoscopy or flexible sigmoidoscopy) you may notice spotting of blood in your stool or on the toilet paper. If you underwent a bowel prep for your procedure, you may not have a normal bowel movement for a few days.  Please Note:  You might notice some irritation and congestion in your nose or some drainage.  This is from the oxygen used during your procedure.  There is no need for concern and it should clear up in a day or so.  SYMPTOMS TO REPORT IMMEDIATELY:   Following lower endoscopy (colonoscopy or flexible sigmoidoscopy):  Excessive amounts of blood in the stool  Significant tenderness or worsening of abdominal pains  Swelling of the abdomen that is new, acute  Fever of 100F or higher   For urgent or emergent issues, a gastroenterologist can be reached at any hour by calling [phone].   DIET:  We do recommend a small meal at first, but then you may proceed to your regular diet.  Drink plenty of fluids but you should avoid alcoholic beverages for 24 hours.  ACTIVITY:  You should plan to take it easy for the rest of today and  you should NOT DRIVE or use heavy machinery until tomorrow (because of the sedation medicines used during the test).    FOLLOW UP: Our staff will call the number listed on your records 48-72 hours following your procedure to check on you and address any questions or concerns that you may have regarding the information given to you following your procedure. If we do not reach you, we will leave a message.  We will attempt to reach you two times.  During this call, we will ask if you have developed any symptoms of COVID 19. If you develop any symptoms (ie: fever, flu-like symptoms, shortness of breath, cough etc.) before then, please call [phone].  If you test positive for Covid 19 in the 2 weeks post procedure, please call and report this information to [place].    If any biopsies were taken you will be contacted by phone or by letter within the next 1-3 weeks.  Please call us at [phone] if you have not heard about the biopsies in 3 weeks.    SIGNATURES/CONFIDENTIALITY: You and/or your care partner have signed paperwork which will be entered into your electronic medical record.  These signatures attest to the fact that that the information above on your After Visit Summary has been reviewed and is understood.  Full responsibility of the confidentiality of this discharge information lies with you and/or your care-partner.

## 2019-05-22 ENCOUNTER — Telehealth: Payer: Self-pay | Admitting: *Deleted

## 2019-05-22 ENCOUNTER — Telehealth: Payer: Self-pay

## 2019-05-22 NOTE — Telephone Encounter (Signed)
Attempted f/u phone call. No answer. Left message. °

## 2019-05-22 NOTE — Telephone Encounter (Signed)
  Follow up Call-  Call back number 05/18/2019  Post procedure Call Back phone  # [phone]  Permission to leave phone message Yes  Some recent data might be hidden     Patient questions:  Do you have a fever, pain , or abdominal swelling? No. Pain Score  0 *  Have you tolerated food without any problems? Yes.    Have you been able to return to your normal activities? Yes.    Do you have any questions about your discharge instructions: Diet   No. Medications  No. Follow up visit  No.  Do you have questions or concerns about your Care? No.  Actions: * If pain score is 4 or above: No action needed, pain <4. 1. Have you developed a fever since your procedure? no  2.   Have you had an respiratory symptoms (SOB or cough) since your procedure? no  3.   Have you tested positive for COVID 19 since your procedure no  4.   Have you had any family members/close contacts diagnosed with the COVID 19 since your procedure?  no   If yes to any of these questions please route to Joylene John, RN and Alphonsa Gin, Therapist, sports.

## 2019-05-24 ENCOUNTER — Encounter: Payer: Self-pay | Admitting: Gastroenterology

## 2019-06-14 ENCOUNTER — Other Ambulatory Visit: Payer: Self-pay | Admitting: Internal Medicine

## 2019-06-18 ENCOUNTER — Other Ambulatory Visit: Payer: Self-pay | Admitting: Internal Medicine

## 2019-07-28 ENCOUNTER — Other Ambulatory Visit: Payer: Self-pay | Admitting: Internal Medicine

## 2019-08-17 LAB — HM MAMMOGRAPHY

## 2019-08-20 ENCOUNTER — Other Ambulatory Visit: Payer: Self-pay | Admitting: Internal Medicine

## 2019-08-31 ENCOUNTER — Encounter: Payer: Self-pay | Admitting: Internal Medicine

## 2019-09-18 ENCOUNTER — Other Ambulatory Visit: Payer: Self-pay | Admitting: Internal Medicine

## 2019-09-25 ENCOUNTER — Ambulatory Visit: Payer: No Typology Code available for payment source | Admitting: Internal Medicine

## 2019-09-25 ENCOUNTER — Encounter: Payer: Self-pay | Admitting: Internal Medicine

## 2019-09-25 ENCOUNTER — Other Ambulatory Visit: Payer: Self-pay

## 2019-09-25 VITALS — BP 132/86 | HR 93 | Ht 62.0 in | Wt 172.0 lb

## 2019-09-25 DIAGNOSIS — F988 Other specified behavioral and emotional disorders with onset usually occurring in childhood and adolescence: Secondary | ICD-10-CM | POA: Diagnosis not present

## 2019-09-25 DIAGNOSIS — F419 Anxiety disorder, unspecified: Secondary | ICD-10-CM | POA: Diagnosis not present

## 2019-09-25 DIAGNOSIS — I2510 Atherosclerotic heart disease of native coronary artery without angina pectoris: Secondary | ICD-10-CM | POA: Insufficient documentation

## 2019-09-25 DIAGNOSIS — S00462A Insect bite (nonvenomous) of left ear, initial encounter: Secondary | ICD-10-CM

## 2019-09-25 DIAGNOSIS — I2583 Coronary atherosclerosis due to lipid rich plaque: Secondary | ICD-10-CM

## 2019-09-25 DIAGNOSIS — I10 Essential (primary) hypertension: Secondary | ICD-10-CM

## 2019-09-25 DIAGNOSIS — W57XXXA Bitten or stung by nonvenomous insect and other nonvenomous arthropods, initial encounter: Secondary | ICD-10-CM

## 2019-09-25 DIAGNOSIS — E785 Hyperlipidemia, unspecified: Secondary | ICD-10-CM | POA: Diagnosis not present

## 2019-09-25 DIAGNOSIS — Z23 Encounter for immunization: Secondary | ICD-10-CM

## 2019-09-25 DIAGNOSIS — F329 Major depressive disorder, single episode, unspecified: Secondary | ICD-10-CM

## 2019-09-25 DIAGNOSIS — R002 Palpitations: Secondary | ICD-10-CM

## 2019-09-25 DIAGNOSIS — F32A Depression, unspecified: Secondary | ICD-10-CM

## 2019-09-25 MED ORDER — ALPRAZOLAM 2 MG PO TABS: ORAL_TABLET | ORAL | 1 refills | Status: DC

## 2019-09-25 MED ORDER — AMPHETAMINE-DEXTROAMPHET ER 5 MG PO CP24: 5.0000 mg | ORAL_CAPSULE | Freq: Every day | ORAL | 0 refills | Status: DC

## 2019-09-25 MED ORDER — LOSARTAN POTASSIUM-HCTZ 50-12.5 MG PO TABS: 1.0000 | ORAL_TABLET | Freq: Every day | ORAL | 3 refills | Status: DC

## 2019-09-25 MED ORDER — ICOSAPENT ETHYL 1 G PO CAPS: 2.0000 g | ORAL_CAPSULE | Freq: Two times a day (BID) | ORAL | 3 refills | Status: DC

## 2019-09-25 NOTE — Assessment & Plan Note (Addendum)
Cor CT score 97 in 2019 Statin intolerant  on Vascepa

## 2019-09-25 NOTE — Assessment & Plan Note (Signed)
No relapse. Mild tachycardia

## 2019-09-25 NOTE — Assessment & Plan Note (Signed)
Will watch 

## 2019-09-25 NOTE — Assessment & Plan Note (Addendum)
Re-start Effexor XR Xanax prn  Potential benefits of a long term benzodiazepines  use as well as potential risks  and complications were explained to the patient and were aknowledged.

## 2019-09-25 NOTE — Assessment & Plan Note (Signed)
Adderall - tolerating a low dose  Potential benefits of a long term amphetamines  use as well as potential risks  and complications were explained to the patient and were aknowledged.

## 2019-09-25 NOTE — Assessment & Plan Note (Signed)
Statin intolerant 2020 - on Vascepa

## 2019-09-25 NOTE — Progress Notes (Signed)
Subjective:  Patient ID: Julia Zimmerman, female    DOB: 03-26-1951  Age: 68 y.o. MRN: [id number]  CC: No chief complaint on file.   HPI Julia Zimmerman presents for stress at work  C/o deer tick in the L ear on Sun  F/u HAs, anxiety  Outpatient Medications Prior to Visit  Medication Sig Dispense Refill  . alprazolam (XANAX) 2 MG tablet TAKE 1 TABLET BY MOUTH AT BEDTIME AS NEEDED FOR INSOMNIA. 90 tablet 1  . b complex vitamins tablet Take 1 tablet by mouth daily. 100 tablet 3  . butalbital-acetaminophen-caffeine (FIORICET) 50-325-40 MG tablet TAKE 1 TABLET BY MOUTH EVERY 8 HOURS AS NEEDED FOR HEADACHE 90 tablet 0  . Cholecalciferol (VITAMIN D3) 1.25 MG (50000 UT) CAPS Take 1 capsule by mouth every 14 (fourteen) days. 6 capsule 3  . diphenhydrAMINE (BENADRYL) 25 MG tablet Take 25 mg by mouth daily as needed for allergies.    . hydrochlorothiazide (MICROZIDE) 12.5 MG capsule TAKE 1 CAPSULE DAILY. FOLLOW-UP APPOINTMENT IS DUE IN MAY, MUST SEE PROVIDER FOR FUTURE REFILLS 90 capsule 1  . Icosapent Ethyl (VASCEPA PO) Take by mouth.    . losartan (COZAAR) 50 MG tablet TAKE 1 TABLET DAILY 90 tablet 1  . omeprazole (PRILOSEC) 20 MG capsule Take 1 capsule (20 mg total) by mouth daily. (Patient taking differently: Take 20 mg by mouth daily as needed (heartburn). ) 30 capsule 11  . venlafaxine XR (EFFEXOR-XR) 150 MG 24 hr capsule TAKE 1 CAPSULE DAILY WITH  BREAKFAST. 90 capsule 3   No facility-administered medications prior to visit.    ROS: Review of Systems  Constitutional: Negative for activity change, appetite change, chills, fatigue and unexpected weight change.  HENT: Negative for congestion, mouth sores and sinus pressure.   Eyes: Negative for visual disturbance.  Respiratory: Negative for cough and chest tightness.   Gastrointestinal: Negative for abdominal pain and nausea.  Genitourinary: Negative for difficulty urinating, frequency and vaginal pain.  Musculoskeletal: Negative  for back pain and gait problem.  Skin: Negative for pallor and rash.  Neurological: Negative for dizziness, tremors, weakness, numbness and headaches.  Psychiatric/Behavioral: Positive for decreased concentration and sleep disturbance. Negative for confusion. The patient is nervous/anxious.     Objective:  BP 132/86   Pulse 93   Ht 5\' 2"  (1.575 m)   Wt 172 lb (78 kg)   SpO2 97%   BMI 31.46 kg/m   BP Readings from Last 3 Encounters:  09/25/19 132/86  05/18/19 (!) 104/56  03/27/19 128/82    Wt Readings from Last 3 Encounters:  09/25/19 172 lb (78 kg)  05/18/19 174 lb 9.6 oz (79.2 kg)  04/19/19 174 lb 9.6 oz (79.2 kg)    Physical Exam Constitutional:      General: She is not in acute distress.    Appearance: She is well-developed. She is obese.  HENT:     Head: Normocephalic.     Right Ear: External ear normal.     Left Ear: External ear normal.     Nose: Nose normal.  Eyes:     General:        Right eye: No discharge.        Left eye: No discharge.     Conjunctiva/sclera: Conjunctivae normal.     Pupils: Pupils are equal, round, and reactive to light.  Neck:     Thyroid: No thyromegaly.     Vascular: No JVD.     Trachea: No tracheal deviation.  Cardiovascular:     Rate and Rhythm: Normal rate and regular rhythm.     Heart sounds: Normal heart sounds.  Pulmonary:     Effort: No respiratory distress.     Breath sounds: No stridor. No wheezing.  Abdominal:     General: Bowel sounds are normal. There is no distension.     Palpations: Abdomen is soft. There is no mass.     Tenderness: There is no abdominal tenderness. There is no guarding or rebound.  Musculoskeletal:        General: No tenderness.     Cervical back: Normal range of motion and neck supple.  Lymphadenopathy:     Cervical: No cervical adenopathy.  Skin:    Findings: No erythema or rash.  Neurological:     Mental Status: She is oriented to person, place, and time.     Cranial Nerves: No cranial  nerve deficit.     Motor: No abnormal muscle tone.     Coordination: Coordination normal.     Deep Tendon Reflexes: Reflexes normal.  Psychiatric:        Behavior: Behavior normal.        Thought Content: Thought content normal.        Judgment: Judgment normal.   L ear w/1 mm scab, no erythema  FTF>45 min discussing COVID vaccines (she will get Pfizer), ADD, lipid management and other issues  Lab Results  Component Value Date   WBC 5.7 03/27/2019   HGB 13.8 03/27/2019   HCT 40.9 03/27/2019   PLT 274.0 03/27/2019   GLUCOSE 92 03/27/2019   CHOL 217 (H) 03/27/2019   TRIG 285.0 (H) 03/27/2019   HDL 44.10 03/27/2019   LDLDIRECT 88.0 03/27/2019   LDLCALC 71 11/22/2012   ALT 15 03/27/2019   AST 17 03/27/2019   NA 138 03/27/2019   K 3.5 03/27/2019   CL 102 03/27/2019   CREATININE 0.59 03/27/2019   BUN 13 03/27/2019   CO2 27 03/27/2019   TSH 1.59 03/27/2019   INR 0.97 04/14/2018    CT CARDIAC SCORING  Addendum Date: 04/17/2018   ADDENDUM REPORT: 04/17/2018 16:49 CLINICAL DATA:  Risk stratification EXAM: Coronary Calcium Score TECHNIQUE: The patient was scanned on a Siemens Somatom 64 slice scanner. Axial non-contrast 3 mm slices were carried out through the heart. The data set was analyzed on a dedicated work station and scored using the [place]. FINDINGS: Non-cardiac: See separate report from [facility]. Ascending aorta: Normal diameter 3.2 cm Pericardium: Normal Coronary arteries: Calcium noted in proximal and mid RCA/LAD IMPRESSION: Coronary calcium score of 97. This was 89 st percentile for age and sex matched control. Jenkins Rouge Electronically Signed   By: Jenkins Rouge M.D.   On: 04/17/2018 16:49   Result Date: 04/17/2018 EXAM: OVER-READ INTERPRETATION  CT CHEST The following report is an over-read performed by radiologist Dr. Suzy Bouchard of [facility] on 04/17/2018. This over-read does not include interpretation of cardiac or coronary  anatomy or pathology. The coronary calcium score interpretation by the cardiologist is attached. COMPARISON:  CT chest 03/18/2009 FINDINGS: Limited view of the lung parenchyma demonstrates mild atelectasis and pleural thickening in the medial RIGHT middle lobe similar to CT 05/18/2008. There is a nodular focus adjacent to the atelectasis measuring 6 mm which is not clearly seen on prior (image 19/4 and image 43/6). Airways are normal. Limited view of the mediastinum demonstrates no adenopathy. Esophagus normal. Limited view of the upper abdomen unremarkable. Limited view of  the skeleton and chest wall is unremarkable. IMPRESSION: 1. New nodular thickening adjacent to chronic atelectasis in RIGHT middle lobe. In patient with smoking history recommend follow-up CT exam. Non-contrast chest CT at 6-12 months is recommended. If the nodule is stable at time of repeat CT, then future CT at 18-24 months (from today's scan) is considered optional for low-risk patients, but is recommended for high-risk patients. This recommendation follows the consensus statement: Guidelines for Management of Incidental Pulmonary Nodules Detected on CT Images: From the Fleischner Society 2017; Radiology 2017; 284:228-243. 2. No additional significant extracardiac findings. These results will be called to the ordering clinician or representative by the Radiologist Assistant, and communication documented in the PACS or zVision Dashboard. Electronically Signed: By: Suzy Bouchard M.D. On: 04/17/2018 16:14    Assessment & Plan:    Walker Kehr, MD

## 2019-09-25 NOTE — Assessment & Plan Note (Signed)
NAS diet Losartan HCTZ

## 2019-09-28 ENCOUNTER — Telehealth: Payer: Self-pay | Admitting: Internal Medicine

## 2019-09-28 NOTE — Telephone Encounter (Signed)
It was done on 5/25.  Thanks

## 2019-09-28 NOTE — Telephone Encounter (Signed)
Check [place] registry last filled 06/30/2019. 24-48 hour protocol on refills will hold until MD return.Marland KitchenJohny Zimmerman

## 2019-09-28 NOTE — Telephone Encounter (Signed)
New message:   1.Medication Requested: alprazolam (XANAX) 2 MG tablet 2. Pharmacy (Name, [place]): [id number] - [place], [place]. AT [address] 3. On Med List: Yes  4. Last Visit with PCP: 09/25/19  5. Next visit date with PCP:   Pt states she is completely out. Please advise.  Agent: Please be advised that RX refills may take up to 3 business days. We ask that you follow-up with your pharmacy.

## 2019-10-14 ENCOUNTER — Other Ambulatory Visit: Payer: Self-pay | Admitting: Internal Medicine

## 2019-10-17 ENCOUNTER — Telehealth: Payer: Self-pay

## 2019-10-17 NOTE — Telephone Encounter (Signed)
New message    Please verify which medication supposed to be on new prescription losartan-hydrochlorothiazide (HYZAAR) 50-12.5 MG tablet  Patient is asking 50 mg losartan

## 2019-10-18 NOTE — Telephone Encounter (Signed)
Spoke with pharmacy and verified pt is taking losartan-hydrochlorothiazide (HYZAAR) 50-12.5 MG not losartan 50mg 

## 2019-11-22 ENCOUNTER — Ambulatory Visit: Payer: No Typology Code available for payment source | Admitting: Gastroenterology

## 2019-11-22 ENCOUNTER — Encounter: Payer: Self-pay | Admitting: Gastroenterology

## 2019-11-22 VITALS — BP 120/70 | HR 84 | Ht 62.0 in | Wt 168.0 lb

## 2019-11-22 DIAGNOSIS — K5900 Constipation, unspecified: Secondary | ICD-10-CM | POA: Diagnosis not present

## 2019-11-22 DIAGNOSIS — Z01818 Encounter for other preprocedural examination: Secondary | ICD-10-CM | POA: Diagnosis not present

## 2019-11-22 DIAGNOSIS — R1310 Dysphagia, unspecified: Secondary | ICD-10-CM | POA: Diagnosis not present

## 2019-11-22 DIAGNOSIS — K219 Gastro-esophageal reflux disease without esophagitis: Secondary | ICD-10-CM

## 2019-11-22 MED ORDER — PANTOPRAZOLE SODIUM 40 MG PO TBEC: 40.0000 mg | DELAYED_RELEASE_TABLET | Freq: Every day | ORAL | 11 refills | Status: DC

## 2019-11-22 NOTE — Progress Notes (Signed)
History of Present Illness: This is a 69 year old female referred by Plotnikov, Evie Lacks, MD for the evaluation of with reflux symptoms and dysphagia.  She relates many years of reflux symptoms with occasional nighttime regurgitation and a mild chronic cough.  Symptoms improved on Prilosec daily.  Over the past several months she has had more frequent breakthrough symptoms, epigastric burning and intermittent solid food dysphagia.  She has tried Prilosec OTC and Nexium OTC which seem to provide about the same relief of symptoms.  She has chronic constipation and abdominal bloating.  She states she has bowel movements about twice per week.  Frequently she does not feel she has complete evacuation.    Allergies  Allergen Reactions  . Sertraline Hcl Hives and Itching  . Sulfa Antibiotics Hives  . Statins     Bad cramps  . Codeine Nausea Only  . Hydrocodone Nausea Only   Outpatient Medications Prior to Visit  Medication Sig Dispense Refill  . alprazolam (XANAX) 2 MG tablet TAKE 1 TABLET BY MOUTH AT BEDTIME AS NEEDED FOR INSOMNIA. 90 tablet 1  . amphetamine-dextroamphetamine (ADDERALL XR) 5 MG 24 hr capsule Take 1 capsule (5 mg total) by mouth daily. 90 capsule 0  . b complex vitamins tablet Take 1 tablet by mouth daily. 100 tablet 3  . butalbital-acetaminophen-caffeine (FIORICET) 50-325-40 MG tablet TAKE 1 TABLET BY MOUTH EVERY 8 HOURS AS NEEDED FOR HEADACHE 90 tablet 1  . Cholecalciferol (VITAMIN D3) 1.25 MG (50000 UT) CAPS Take 1 capsule by mouth every 14 (fourteen) days. 6 capsule 3  . diphenhydrAMINE (BENADRYL) 25 MG tablet Take 25 mg by mouth daily as needed for allergies.    [name] icosapent Ethyl (VASCEPA) 1 g capsule Take 2 capsules (2 g total) by mouth 2 (two) times daily. 360 capsule 3  . losartan-hydrochlorothiazide (HYZAAR) 50-12.5 MG tablet Take 1 tablet by mouth daily. 90 tablet 3  . omeprazole (PRILOSEC) 20 MG capsule Take 1 capsule (20 mg total) by mouth daily. (Patient taking  differently: Take 20 mg by mouth daily as needed (heartburn). ) 30 capsule 11  . venlafaxine XR (EFFEXOR-XR) 150 MG 24 hr capsule TAKE 1 CAPSULE DAILY WITH  BREAKFAST. 90 capsule 3   No facility-administered medications prior to visit.   Past Medical History:  Diagnosis Date  . ADD 10/30/2008   Qualifier: Diagnosis of  By: Niel Hummer MD, Lorinda Creed Allergy   . Anxiety   . ANXIETY DEPRESSION 03/29/2007   Qualifier: Diagnosis of  By: Niel Hummer MD, Lorinda Creed   . Complication of anesthesia   . Cough 04/21/2010   full eval including allergist. Working diagnosis - reflux.  . Depression   . Endometriosis   . Episodic tension type headache 11/07/2007   Qualifier: Diagnosis of  By: Niel Hummer MD, [place] hypertension, benign 05/15/2010   Qualifier: Diagnosis of  By: Elizebeth Koller MD, Mora Appl   . GERD 04/21/2010   Qualifier: Diagnosis of  By: Niel Hummer MD, Lorinda Creed   . GERD (gastroesophageal reflux disease)   . Hyperlipidemia   . HYPERLIPIDEMIA 05/15/2010   Qualifier: Diagnosis of  By: Elizebeth Koller MD, Mora Appl   . Learning disability    poor attention span and difficulty with retention  . Lumbosacral facet joint syndrome 04/11/2018  . ONYCHOMYCOSIS 10/30/2008   successfully treated with lamisil.  [name] PONV (postoperative nausea and vomiting)   . Primary localized osteoarthritis of right knee 04/11/2018  .  RHINITIS 04/21/2010   Qualifier: Diagnosis of  By: Niel Hummer MD, Lorinda Creed    Past Surgical History:  Procedure Laterality Date  . CHOLECYSTECTOMY N/A 11/20/2016   Procedure: LAPAROSCOPIC CHOLECYSTECTOMY WITH INTRAOPERATIVE CHOLANGIOGRAM;  Surgeon: Leighton Ruff, MD;  Location: [facility];  Service: General;  Laterality: N/A;  . G0P0    . LAPAROSCOPY  1994   x 2 for endometriosis  . LAPAROTOMY  1989   for benign tumor  . MENISCUS REPAIR Left   . NECK MASS EXCISION  1974   benign mass left  . TOTAL KNEE ARTHROPLASTY Right 04/24/2018   Procedure: TOTAL KNEE ARTHROPLASTY;   Surgeon: Elsie Saas, MD;  Location: [facility];  Service: Orthopedics;  Laterality: Right;   Social History   Socioeconomic History  . Marital status: Widowed    Spouse name: Not on file  . Number of children: 0  . Years of education: 36  . Highest education level: Not on file  Occupational History  . Occupation: CUSTOMER SERVICE    Employer: [employer]  Tobacco Use  . Smoking status: Former Smoker    Packs/day: 1.50    Years: 19.00    Pack years: 28.50    Types: Cigarettes    Quit date: 10/27/1990    Years since quitting: 29.0  . Smokeless tobacco: Never Used  Vaping Use  . Vaping Use: Never used  Substance and Sexual Activity  . Alcohol use: No  . Drug use: No  . Sexual activity: Not Currently  Other Topics Concern  . Not on file  Social History Narrative   HSG, 2 years [employer] in [place]. Married 1975- 45 yrs/widowed. No children. Work - [employer], prior to that [employer]. Lives alone with her dogs (resucues -7), cats (3). No abuse history.    Social Determinants of Health   Financial Resource Strain:   . Difficulty of Paying Living Expenses:   Food Insecurity:   . Worried About Charity fundraiser in the Last Year:   . Arboriculturist in the Last Year:   Transportation Needs:   . Film/video editor (Medical):   [name] Lack of Transportation (Non-Medical):   Physical Activity:   . Days of Exercise per Week:   . Minutes of Exercise per Session:   Stress:   . Feeling of Stress :   Social Connections:   . Frequency of Communication with Friends and Family:   . Frequency of Social Gatherings with Friends and Family:   . Attends Religious Services:   . Active Member of Clubs or Organizations:   . Attends Archivist Meetings:   [name] Marital Status:    Family History  Problem Relation Age of Onset  . Hypertension Mother   . Diabetes Mother   . Arthritis Father        gout  . Heart disease Sister         palpitations  . Cancer Neg Hx   . COPD Neg Hx   . Colon cancer Neg Hx   . Esophageal cancer Neg Hx   . Stomach cancer Neg Hx   . Rectal cancer Neg Hx      Review of Systems: Pertinent positive and negative review of systems were noted in the above HPI section. All other review of systems were otherwise negative.   Physical Exam: General: Well developed, well nourished, no acute distress Head: Normocephalic and atraumatic Eyes:  sclerae anicteric, EOMI Ears:  Normal auditory acuity Mouth: Not examined, mask on during Covid-19 pandemic Neck: Supple, no masses or thyromegaly Lungs: Clear throughout to auscultation Heart: Regular rate and rhythm; no murmurs, rubs or bruits Abdomen: Soft, non tender and non distended. No masses, hepatosplenomegaly or hernias noted. Normal Bowel sounds Rectal: Not done Musculoskeletal: Symmetrical with no gross deformities  Skin: No lesions on visible extremities Pulses:  Normal pulses noted Extremities: No clubbing, cyanosis, edema or deformities noted Neurological: Alert oriented x 4, grossly nonfocal Cervical Nodes:  No significant cervical adenopathy Inguinal Nodes: No significant inguinal adenopathy Psychological:  Alert and cooperative. Normal mood and affect   Assessment and Recommendations:  1.  Chronic GERD with breakthrough symptoms, epigastric burning and new solid food dysphagia.  Rule out esophagitis, stricture, Barrett's and other disorders.  Change to pantoprazole 40 mg every morning.  Follow standard antireflux measures.  Schedule EGD with possible dilation. The risks (including bleeding, perforation, infection, missed lesions, medication reactions and possible hospitalization or surgery if complications occur), benefits, and alternatives to endoscopy with possible biopsy and possible dilation were discussed with the patient and they consent to proceed.   2.  Chronic constipation, abdominal bloating.  Begin MiraLAX once daily. If  symptoms not adequately controlled increase to twice daily.  3.  Personal history of adenomatous colon polyps.  A 7-year interval surveillance colonoscopy is recommended in January 2028.   cc: Plotnikov, Evie Lacks, MD [address] [place],  [place] [address]

## 2019-11-22 NOTE — Patient Instructions (Signed)
We have sent the following medications to your pharmacy for you to pick up at your convenience: Pantoprazole 40 mg daily.   Start over the counter Miralax mixing 17 grams in 8 oz of water daily for constipation.   You have been scheduled for an endoscopy. Please follow written instructions given to you at your visit today. If you use inhalers (even only as needed), please bring them with you on the day of your procedure.  Thank you for choosing me and [facility].  Pricilla Riffle. Dagoberto Ligas., MD., Marval Regal

## 2019-12-10 ENCOUNTER — Other Ambulatory Visit: Payer: Self-pay | Admitting: Internal Medicine

## 2019-12-20 ENCOUNTER — Other Ambulatory Visit: Payer: Self-pay | Admitting: Internal Medicine

## 2019-12-26 ENCOUNTER — Encounter: Payer: Self-pay | Admitting: Gastroenterology

## 2019-12-26 ENCOUNTER — Ambulatory Visit (AMBULATORY_SURGERY_CENTER): Payer: No Typology Code available for payment source | Admitting: Gastroenterology

## 2019-12-26 ENCOUNTER — Other Ambulatory Visit: Payer: Self-pay

## 2019-12-26 VITALS — BP 113/67 | HR 77 | Temp 96.9°F | Resp 22 | Ht 62.0 in | Wt 168.0 lb

## 2019-12-26 DIAGNOSIS — R1310 Dysphagia, unspecified: Secondary | ICD-10-CM | POA: Diagnosis present

## 2019-12-26 DIAGNOSIS — K222 Esophageal obstruction: Secondary | ICD-10-CM | POA: Diagnosis not present

## 2019-12-26 DIAGNOSIS — K219 Gastro-esophageal reflux disease without esophagitis: Secondary | ICD-10-CM

## 2019-12-26 DIAGNOSIS — K449 Diaphragmatic hernia without obstruction or gangrene: Secondary | ICD-10-CM | POA: Diagnosis not present

## 2019-12-26 MED ORDER — SODIUM CHLORIDE 0.9 % IV SOLN: 500.0000 mL | Freq: Once | INTRAVENOUS | Status: DC

## 2019-12-26 NOTE — Progress Notes (Signed)
Report to PACU, RN, vss, BBS= Clear.  

## 2019-12-26 NOTE — Op Note (Signed)
[facility] Patient Name: Julia Zimmerman Procedure Date: 12/26/2019 4:23 PM MRN: [id number] Endoscopist: Ladene Artist , MD Age: 69 Referring MD:  Date of Birth: [birth year] Gender: Female Account #: [id number] Procedure:                Upper GI endoscopy Indications:              Dysphagia, Gastroesophageal reflux disease Medicines:                Monitored Anesthesia Care Procedure:                Pre-Anesthesia Assessment:                           - Prior to the procedure, a History and Physical                            was performed, and patient medications and                            allergies were reviewed. The patient's tolerance of                            previous anesthesia was also reviewed. The risks                            and benefits of the procedure and the sedation                            options and risks were discussed with the patient.                            All questions were answered, and informed consent                            was obtained. Prior Anticoagulants: The patient has                            taken no previous anticoagulant or antiplatelet                            agents. ASA Grade Assessment: II - A patient with                            mild systemic disease. After reviewing the risks                            and benefits, the patient was deemed in                            satisfactory condition to undergo the procedure.                           After obtaining informed consent, the endoscope was  passed under direct vision. Throughout the                            procedure, the patient's blood pressure, pulse, and                            oxygen saturations were monitored continuously. The                            Endoscope was introduced through the mouth, and                            advanced to the second part of duodenum. The upper                            GI  endoscopy was accomplished without difficulty.                            The patient tolerated the procedure well. Scope In: Scope Out: Findings:                 One benign-appearing, intrinsic mild stenosis was                            found at the gastroesophageal junction. This                            stenosis measured 1.3 cm (inner diameter) x less                            than one cm (in length). The stenosis was                            traversed. A guidewire was placed and the scope was                            withdrawn. Dilations were performed with Savary                            dilators with mild resistance at 14 mm and 15 mm,                            without heme noted.                           The exam of the esophagus was otherwise normal.                           A medium-sized hiatal hernia was present.                           The exam of the stomach was otherwise normal.  The duodenal bulb and second portion of the                            duodenum were normal. Complications:            No immediate complications. Estimated Blood Loss:     Estimated blood loss: none. Impression:               - Benign-appearing esophageal stenosis. Dilated.                           - Medium-sized hiatal hernia.                           - Normal duodenal bulb and second portion of the                            duodenum.                           - No specimens collected. Recommendation:           - Patient has a contact number available for                            emergencies. The signs and symptoms of potential                            delayed complications were discussed with the                            patient. Return to normal activities tomorrow.                            Written discharge instructions were provided to the                            patient.                           - Clear liquid diet for 2 hours, then  advance as                            tolerated to soft diet today.                           - Resume prior diet tomorrow.                           - Antireflux measures long term.                           - Continue present medications including                            pantoprazole 40 mg po qd long term.                           -  Return to GI office in 1 year. Ladene Artist, MD 12/26/2019 4:34:48 PM This report has been signed electronically.

## 2019-12-26 NOTE — Progress Notes (Signed)
[place] vitals and SM IV.

## 2019-12-26 NOTE — Patient Instructions (Signed)
Read all handouts given to you by your recovery room nurse.  Thank-you for choosing [place] for your healthcare needs today.  YOU HAD AN ENDOSCOPIC PROCEDURE TODAY AT [facility]:   Refer to the procedure report that was given to you for any specific questions about what was found during the examination.  If the procedure report does not answer your questions, please call your gastroenterologist to clarify.  If you requested that your care partner not be given the details of your procedure findings, then the procedure report has been included in a sealed envelope for you to review at your convenience later.  YOU SHOULD EXPECT: Some feelings of bloating in the abdomen. Passage of more gas than usual.  Walking can help get rid of the air that was put into your GI tract during the procedure and reduce the bloating. If you had a lower endoscopy (such as a colonoscopy or flexible sigmoidoscopy) you may notice spotting of blood in your stool or on the toilet paper. If you underwent a bowel prep for your procedure, you may not have a normal bowel movement for a few days.  Please Note:  You might notice some irritation and congestion in your nose or some drainage.  This is from the oxygen used during your procedure.  There is no need for concern and it should clear up in a day or so.  SYMPTOMS TO REPORT IMMEDIATELY:    Following upper endoscopy (EGD)  Vomiting of blood or coffee ground material  New chest pain or pain under the shoulder blades  Painful or persistently difficult swallowing  New shortness of breath  Fever of 100F or higher  Black, tarry-looking stools  For urgent or emergent issues, a gastroenterologist can be reached at any hour by calling [phone]. Do not use MyChart messaging for urgent concerns.    DIET:  We do recommend clear liquids until  6 pm.  You may have a  regular diet tomorrow..  Drink plenty of fluids but you should avoid alcoholic beverages for 24  hours.  ACTIVITY:  You should plan to take it easy for the rest of today and you should NOT DRIVE or use heavy machinery until tomorrow (because of the sedation medicines used during the test).    FOLLOW UP: Our staff will call the number listed on your records 48-72 hours following your procedure to check on you and address any questions or concerns that you may have regarding the information given to you following your procedure. If we do not reach you, we will leave a message.  We will attempt to reach you two times.  During this call, we will ask if you have developed any symptoms of COVID 19. If you develop any symptoms (ie: fever, flu-like symptoms, shortness of breath, cough etc.) before then, please call [phone].  If you test positive for Covid 19 in the 2 weeks post procedure, please call and report this information to [place].    SIGNATURES/CONFIDENTIALITY: You and/or your care partner have signed paperwork which will be entered into your electronic medical record.  These signatures attest to the fact that that the information above on your After Visit Summary has been reviewed and is understood.  Full responsibility of the confidentiality of this discharge information lies with you and/or your care-partner.

## 2019-12-28 ENCOUNTER — Telehealth: Payer: Self-pay | Admitting: *Deleted

## 2019-12-28 ENCOUNTER — Telehealth: Payer: Self-pay

## 2019-12-28 NOTE — Telephone Encounter (Signed)
°  Follow up Call-  Call back number 12/26/2019 05/18/2019  Post procedure Call Back phone  # [phone]  Permission to leave phone message Yes Yes  Some recent data might be hidden     Patient questions:  Message left to call us if necessary. Second call.

## 2019-12-28 NOTE — Telephone Encounter (Signed)
First post procedure follow up call, no answer 

## 2020-02-01 ENCOUNTER — Other Ambulatory Visit: Payer: Self-pay | Admitting: Internal Medicine

## 2020-02-07 ENCOUNTER — Other Ambulatory Visit: Payer: Self-pay | Admitting: Internal Medicine

## 2020-03-17 ENCOUNTER — Other Ambulatory Visit: Payer: Self-pay | Admitting: Internal Medicine

## 2020-03-31 ENCOUNTER — Other Ambulatory Visit: Payer: Self-pay | Admitting: Internal Medicine

## 2020-03-31 ENCOUNTER — Ambulatory Visit: Payer: No Typology Code available for payment source | Admitting: Internal Medicine

## 2020-04-24 ENCOUNTER — Other Ambulatory Visit: Payer: Self-pay

## 2020-04-24 ENCOUNTER — Encounter: Payer: Self-pay | Admitting: Internal Medicine

## 2020-04-24 ENCOUNTER — Ambulatory Visit (INDEPENDENT_AMBULATORY_CARE_PROVIDER_SITE_OTHER): Payer: No Typology Code available for payment source | Admitting: Internal Medicine

## 2020-04-24 VITALS — BP 120/82 | HR 94 | Temp 99.3°F | Ht 62.0 in | Wt 171.0 lb

## 2020-04-24 DIAGNOSIS — F988 Other specified behavioral and emotional disorders with onset usually occurring in childhood and adolescence: Secondary | ICD-10-CM

## 2020-04-24 DIAGNOSIS — I2510 Atherosclerotic heart disease of native coronary artery without angina pectoris: Secondary | ICD-10-CM | POA: Diagnosis not present

## 2020-04-24 DIAGNOSIS — Z23 Encounter for immunization: Secondary | ICD-10-CM | POA: Diagnosis not present

## 2020-04-24 DIAGNOSIS — R059 Cough, unspecified: Secondary | ICD-10-CM

## 2020-04-24 DIAGNOSIS — R9389 Abnormal findings on diagnostic imaging of other specified body structures: Secondary | ICD-10-CM | POA: Insufficient documentation

## 2020-04-24 DIAGNOSIS — Z0000 Encounter for general adult medical examination without abnormal findings: Secondary | ICD-10-CM | POA: Diagnosis not present

## 2020-04-24 DIAGNOSIS — I2583 Coronary atherosclerosis due to lipid rich plaque: Secondary | ICD-10-CM

## 2020-04-24 LAB — LIPID PANEL
Cholesterol: 233 mg/dL — ABNORMAL HIGH (ref 0–200)
HDL: 46.7 mg/dL (ref >39.00)
NonHDL: 185.99
Total CHOL/HDL Ratio: 5
Triglycerides: 357 mg/dL — ABNORMAL HIGH (ref 0.0–149.0)
VLDL: 71.4 mg/dL — ABNORMAL HIGH (ref 0.0–40.0)

## 2020-04-24 LAB — CBC WITH DIFFERENTIAL/PLATELET
Basophils Absolute: 0.1 10*3/uL (ref 0.0–0.1)
Basophils Relative: 0.8 % (ref 0.0–3.0)
Eosinophils Absolute: 0.2 10*3/uL (ref 0.0–0.7)
Eosinophils Relative: 3.2 % (ref 0.0–5.0)
HCT: 40.7 % (ref 36.0–46.0)
Hemoglobin: 14 g/dL (ref 12.0–15.0)
Lymphocytes Relative: 27.1 % (ref 12.0–46.0)
Lymphs Abs: 1.8 10*3/uL (ref 0.7–4.0)
MCHC: 34.4 g/dL (ref 30.0–36.0)
MCV: 86.7 fl (ref 78.0–100.0)
Monocytes Absolute: 0.6 10*3/uL (ref 0.1–1.0)
Monocytes Relative: 9.8 % (ref 3.0–12.0)
Neutro Abs: 3.9 10*3/uL (ref 1.4–7.7)
Neutrophils Relative %: 59.1 % (ref 43.0–77.0)
Platelets: 323 10*3/uL (ref 150.0–400.0)
RBC: 4.69 Mil/uL (ref 3.87–5.11)
RDW: 13.4 % (ref 11.5–15.5)
WBC: 6.6 10*3/uL (ref 4.0–10.5)

## 2020-04-24 LAB — URINALYSIS
Bilirubin Urine: NEGATIVE
Hgb urine dipstick: NEGATIVE
Ketones, ur: NEGATIVE
Leukocytes,Ua: NEGATIVE
Nitrite: NEGATIVE
Specific Gravity, Urine: 1.025 (ref 1.000–1.030)
Total Protein, Urine: NEGATIVE
Urine Glucose: NEGATIVE
Urobilinogen, UA: 0.2 (ref 0.0–1.0)
pH: 5.5 (ref 5.0–8.0)

## 2020-04-24 LAB — COMPREHENSIVE METABOLIC PANEL
ALT: 19 U/L (ref 0–35)
AST: 18 U/L (ref 0–37)
Albumin: 4 g/dL (ref 3.5–5.2)
Alkaline Phosphatase: 106 U/L (ref 39–117)
BUN: 13 mg/dL (ref 6–23)
CO2: 28 mEq/L (ref 19–32)
Calcium: 9.1 mg/dL (ref 8.4–10.5)
Chloride: 102 mEq/L (ref 96–112)
Creatinine, Ser: 0.63 mg/dL (ref 0.40–1.20)
GFR: 91.57 mL/min (ref >60.00)
Glucose, Bld: 90 mg/dL (ref 70–99)
Potassium: 3.7 mEq/L (ref 3.5–5.1)
Sodium: 138 mEq/L (ref 135–145)
Total Bilirubin: 0.3 mg/dL (ref 0.2–1.2)
Total Protein: 7.3 g/dL (ref 6.0–8.3)

## 2020-04-24 LAB — TSH: TSH: 1.57 u[IU]/mL (ref 0.35–4.50)

## 2020-04-24 LAB — LDL CHOLESTEROL, DIRECT: Direct LDL: 100 mg/dL

## 2020-04-24 MED ORDER — AMPHETAMINE-DEXTROAMPHET ER 5 MG PO CP24
5.0000 mg | ORAL_CAPSULE | Freq: Every day | ORAL | 0 refills | Status: DC
Start: 2020-04-24 — End: 2020-10-22

## 2020-04-24 NOTE — Progress Notes (Signed)
Subjective:  Patient ID: KEYDI ALDERS, female    DOB: 09/01/1950  Age: 69 y.o. MRN: [id number]  CC: Annual Exam (Flu shot)   HPI KASHONNA GENERETTE presents for a well exam C/o URI and cough x 2 wks Follow-up on a chest CT abnormality.  Outpatient Medications Prior to Visit  Medication Sig Dispense Refill  . alprazolam (XANAX) 2 MG tablet TAKE 1 TABLET BY MOUTH EVERY DAY AT BEDTIME AS NEEDED FOR INSOMNIA 90 tablet 1  . amphetamine-dextroamphetamine (ADDERALL XR) 5 MG 24 hr capsule Take 1 capsule (5 mg total) by mouth daily. 90 capsule 0  . b complex vitamins tablet Take 1 tablet by mouth daily. 100 tablet 3  . B Complex-C-Folic Acid (B-COMPLEX/FOLIC ACID/VITAMIN C) TBCR Take 1 tablet by mouth daily.    . butalbital-acetaminophen-caffeine (FIORICET) 50-325-40 MG tablet TAKE 1 TABLET BY MOUTH EVERY 8 HOURS AS NEEDED FOR HEADACHE 90 tablet 1  . diphenhydrAMINE (BENADRYL) 25 MG tablet Take 25 mg by mouth daily as needed for allergies.    . hydrochlorothiazide (MICROZIDE) 12.5 MG capsule Take 1 capsule (12.5 mg total) by mouth daily. Annual appt due in Nov must see provider for future refills 90 capsule 0  . icosapent Ethyl (VASCEPA) 1 g capsule Take 2 capsules (2 g total) by mouth 2 (two) times daily. 360 capsule 3  . losartan-hydrochlorothiazide (HYZAAR) 50-12.5 MG tablet Take 1 tablet by mouth daily. 90 tablet 3  . pantoprazole (PROTONIX) 40 MG tablet Take 1 tablet (40 mg total) by mouth daily. 30 tablet 11  . venlafaxine XR (EFFEXOR-XR) 150 MG 24 hr capsule Take 1 capsule (150 mg total) by mouth daily with breakfast. Annual appt due in Nov must see provider for future refills 90 capsule 0  . Cholecalciferol (VITAMIN D3) 1.25 MG (50000 UT) CAPS TAKE 1 CAPSULE BY MOUTH EVERY 30 (THIRTY) DAYS. (Patient not taking: Reported on 04/24/2020) 9 capsule 3   No facility-administered medications prior to visit.    ROS: Review of Systems  Constitutional: Negative for activity change, appetite  change, chills, fatigue and unexpected weight change.  HENT: Negative for congestion, mouth sores and sinus pressure.   Eyes: Negative for visual disturbance.  Respiratory: Positive for cough. Negative for chest tightness.   Gastrointestinal: Negative for abdominal pain and nausea.  Genitourinary: Negative for difficulty urinating, frequency and vaginal pain.  Musculoskeletal: Negative for back pain and gait problem.  Skin: Negative for pallor and rash.  Neurological: Negative for dizziness, tremors, weakness, numbness and headaches.  Psychiatric/Behavioral: Negative for confusion and sleep disturbance.    Objective:  BP 120/82 (BP Location: Left Arm)   Pulse 94   Temp 99.3 F (37.4 C) (Oral)   Ht 5\' 2"  (1.575 m)   Wt 171 lb (77.6 kg)   SpO2 96%   BMI 31.28 kg/m   BP Readings from Last 3 Encounters:  04/24/20 120/82  12/26/19 113/67  11/22/19 120/70    Wt Readings from Last 3 Encounters:  04/24/20 171 lb (77.6 kg)  12/26/19 168 lb (76.2 kg)  11/22/19 168 lb (76.2 kg)    Physical Exam Constitutional:      General: She is not in acute distress.    Appearance: She is well-developed.  HENT:     Head: Normocephalic.     Right Ear: External ear normal.     Left Ear: External ear normal.     Nose: Nose normal.     Mouth/Throat:     Mouth: Oropharynx is clear  and moist.  Eyes:     General:        Right eye: No discharge.        Left eye: No discharge.     Conjunctiva/sclera: Conjunctivae normal.     Pupils: Pupils are equal, round, and reactive to light.  Neck:     Thyroid: No thyromegaly.     Vascular: No JVD.     Trachea: No tracheal deviation.  Cardiovascular:     Rate and Rhythm: Normal rate and regular rhythm.     Heart sounds: Normal heart sounds.  Pulmonary:     Effort: No respiratory distress.     Breath sounds: No stridor. No wheezing.  Abdominal:     General: Bowel sounds are normal. There is no distension.     Palpations: Abdomen is soft. There is no  mass.     Tenderness: There is no abdominal tenderness. There is no guarding or rebound.  Musculoskeletal:        General: No tenderness or edema.     Cervical back: Normal range of motion and neck supple.  Lymphadenopathy:     Cervical: No cervical adenopathy.  Skin:    Findings: No erythema or rash.  Neurological:     Cranial Nerves: No cranial nerve deficit.     Motor: No abnormal muscle tone.     Coordination: Coordination normal.     Deep Tendon Reflexes: Reflexes normal.  Psychiatric:        Mood and Affect: Mood and affect normal.        Behavior: Behavior normal.        Thought Content: Thought content normal.        Judgment: Judgment normal.    I spent 22 minutes in addition to time for CPX wellness examination in preparing to see the patient by review of recent labs, imaging and procedures, obtaining and reviewing separately obtained history, communicating with the patient, ordering medications, tests or procedures, and documenting clinical information in the EHR including the differential diagnosis, treatment, and any further evaluation and other management of  a previous chest CT abnormality, cough, ADD         Lab Results  Component Value Date   WBC 5.7 03/27/2019   HGB 13.8 03/27/2019   HCT 40.9 03/27/2019   PLT 274.0 03/27/2019   GLUCOSE 92 03/27/2019   CHOL 217 (H) 03/27/2019   TRIG 285.0 (H) 03/27/2019   HDL 44.10 03/27/2019   LDLDIRECT 88.0 03/27/2019   LDLCALC 71 11/22/2012   ALT 15 03/27/2019   AST 17 03/27/2019   NA 138 03/27/2019   K 3.5 03/27/2019   CL 102 03/27/2019   CREATININE 0.59 03/27/2019   BUN 13 03/27/2019   CO2 27 03/27/2019   TSH 1.59 03/27/2019   INR 0.97 04/14/2018    CT CARDIAC SCORING  Addendum Date: 04/17/2018   ADDENDUM REPORT: 04/17/2018 16:49 CLINICAL DATA:  Risk stratification EXAM: Coronary Calcium Score TECHNIQUE: The patient was scanned on a Siemens Somatom 64 slice scanner. Axial non-contrast 3 mm slices were  carried out through the heart. The data set was analyzed on a dedicated work station and scored using the [place]. FINDINGS: Non-cardiac: See separate report from [facility]. Ascending aorta: Normal diameter 3.2 cm Pericardium: Normal Coronary arteries: Calcium noted in proximal and mid RCA/LAD IMPRESSION: Coronary calcium score of 97. This was 61 st percentile for age and sex matched control. [place] Electronically Signed   By: Eligha Bridegroom.D.  On: 04/17/2018 16:49   Result Date: 04/17/2018 EXAM: OVER-READ INTERPRETATION  CT CHEST The following report is an over-read performed by radiologist Dr. Suzy Bouchard of [facility] on 04/17/2018. This over-read does not include interpretation of cardiac or coronary anatomy or pathology. The coronary calcium score interpretation by the cardiologist is attached. COMPARISON:  CT chest 03/18/2009 FINDINGS: Limited view of the lung parenchyma demonstrates mild atelectasis and pleural thickening in the medial RIGHT middle lobe similar to CT 05/18/2008. There is a nodular focus adjacent to the atelectasis measuring 6 mm which is not clearly seen on prior (image 19/4 and image 43/6). Airways are normal. Limited view of the mediastinum demonstrates no adenopathy. Esophagus normal. Limited view of the upper abdomen unremarkable. Limited view of the skeleton and chest wall is unremarkable. IMPRESSION: 1. New nodular thickening adjacent to chronic atelectasis in RIGHT middle lobe. In patient with smoking history recommend follow-up CT exam. Non-contrast chest CT at 6-12 months is recommended. If the nodule is stable at time of repeat CT, then future CT at 18-24 months (from today's scan) is considered optional for low-risk patients, but is recommended for high-risk patients. This recommendation follows the consensus statement: Guidelines for Management of Incidental Pulmonary Nodules Detected on CT Images: From the Fleischner Society 2017;  Radiology 2017; 284:228-243. 2. No additional significant extracardiac findings. These results will be called to the ordering clinician or representative by the Radiologist Assistant, and communication documented in the PACS or zVision Dashboard. Electronically Signed: By: Suzy Bouchard M.D. On: 04/17/2018 16:14    Assessment & Plan:     Follow-up: No follow-ups on file.  Walker Kehr, MD

## 2020-04-24 NOTE — Assessment & Plan Note (Addendum)
2019 CT: New nodular thickening adjacent to chronic atelectasis in RIGHT middle lobe.  Discussed.  Repeat CT due to cough

## 2020-04-24 NOTE — Assessment & Plan Note (Signed)
Statins, Repatha discussed

## 2020-04-24 NOTE — Assessment & Plan Note (Signed)
Adderall Rx renewed Lion's mane

## 2020-04-24 NOTE — Assessment & Plan Note (Signed)
She appears well, in no apparent distress.  Alert and oriented times three, pleasant and cooperative. Vital signs are as documented in vital signs section. EGD, colon 2021

## 2020-04-24 NOTE — Patient Instructions (Signed)
You can try Lion's Mane Mushroom capsules for memory, focus ([employer] or [employer])   

## 2020-04-27 ENCOUNTER — Encounter: Payer: Self-pay | Admitting: Internal Medicine

## 2020-04-27 DIAGNOSIS — G720 Drug-induced myopathy: Secondary | ICD-10-CM | POA: Insufficient documentation

## 2020-04-28 ENCOUNTER — Other Ambulatory Visit: Payer: Self-pay

## 2020-04-28 ENCOUNTER — Ambulatory Visit (INDEPENDENT_AMBULATORY_CARE_PROVIDER_SITE_OTHER)
Admission: RE | Admit: 2020-04-28 | Discharge: 2020-04-28 | Disposition: A | Discharge status: Home / Self Care | Payer: No Typology Code available for payment source | Source: Ambulatory Visit | Attending: Internal Medicine | Admitting: Internal Medicine

## 2020-04-28 DIAGNOSIS — R059 Cough, unspecified: Secondary | ICD-10-CM

## 2020-04-28 DIAGNOSIS — R9389 Abnormal findings on diagnostic imaging of other specified body structures: Secondary | ICD-10-CM

## 2020-04-28 NOTE — Assessment & Plan Note (Signed)
Obtain chest CT due to previous CT abnormalities.

## 2020-04-29 ENCOUNTER — Other Ambulatory Visit: Payer: Self-pay | Admitting: Internal Medicine

## 2020-04-29 DIAGNOSIS — G720 Drug-induced myopathy: Secondary | ICD-10-CM

## 2020-04-29 DIAGNOSIS — I2583 Coronary atherosclerosis due to lipid rich plaque: Secondary | ICD-10-CM

## 2020-05-13 ENCOUNTER — Other Ambulatory Visit: Payer: Self-pay | Admitting: Internal Medicine

## 2020-05-23 ENCOUNTER — Other Ambulatory Visit: Payer: Self-pay | Admitting: Internal Medicine

## 2020-05-28 IMAGING — CT CT HEART SCORING
2 series · 16 of 20 positions shown, 18 images · non-contrast
Comparison: CT chest 03/18/2009

Addendum:
EXAM:
OVER-READ INTERPRETATION  CT CHEST

The following report is an over-read performed by radiologist Dr.
As Mae Ej [REDACTED] on 04/17/2018. This
over-read does not include interpretation of cardiac or coronary
anatomy or pathology. The coronary calcium score interpretation by
the cardiologist is attached.
CLINICAL DATA: Risk stratification
Coronary Calcium Score
TECHNIQUE: The patient was scanned on a Siemens Somatom 64 slice scanner. Axial
non-contrast 3 mm slices were carried out through the heart. The
data set was analyzed on a dedicated work station and scored using
the Agatson method.

[Series 3: casc 3.0 i36f 2 bestdiast 71 % · axial · 0.35mm/px · z∈[-180,-96]mm · 8 of 38 slices shown, 10 images]
[im 5/38  vessel]
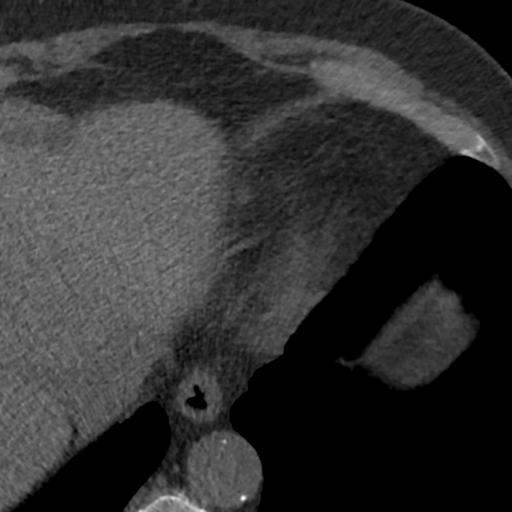
[im 5/38  lung]
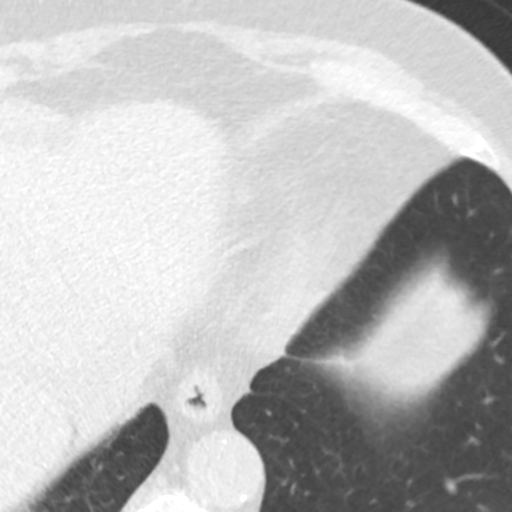
[im 9/38  vessel]
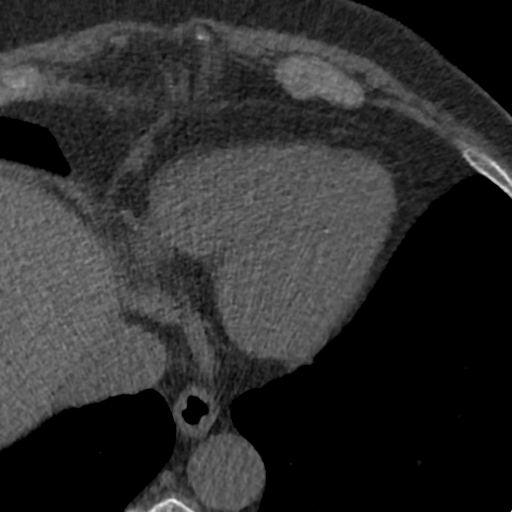
[im 13/38  vessel]
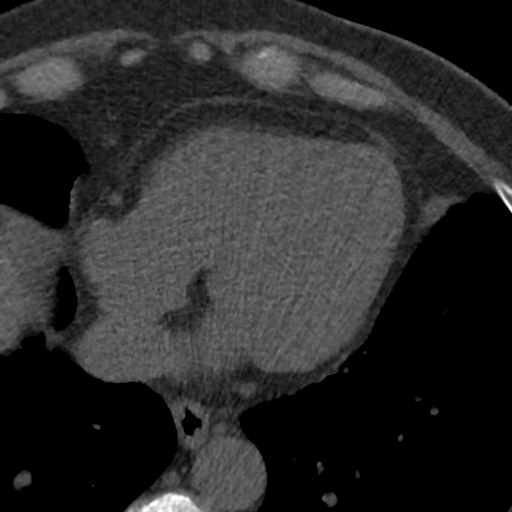
[im 17/38  vessel]
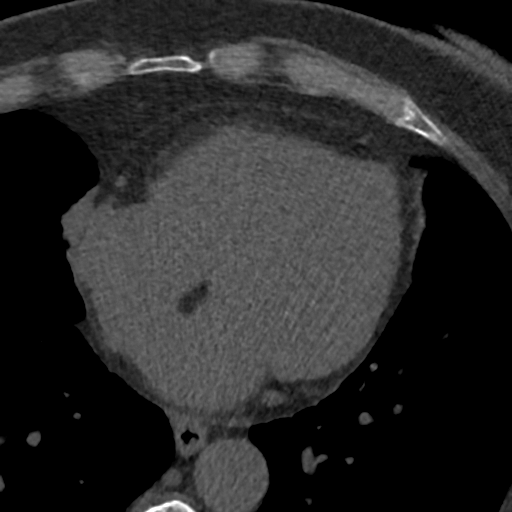
[im 21/38  vessel]
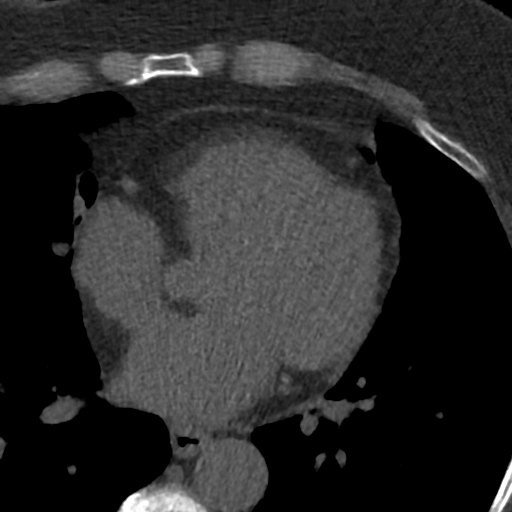
[im 21/38  lung]
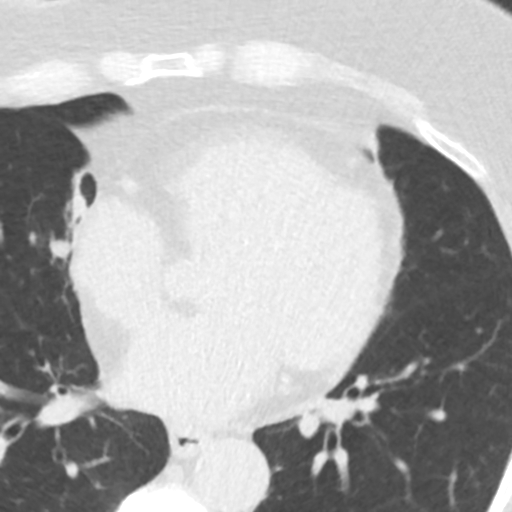
[im 25/38  vessel]
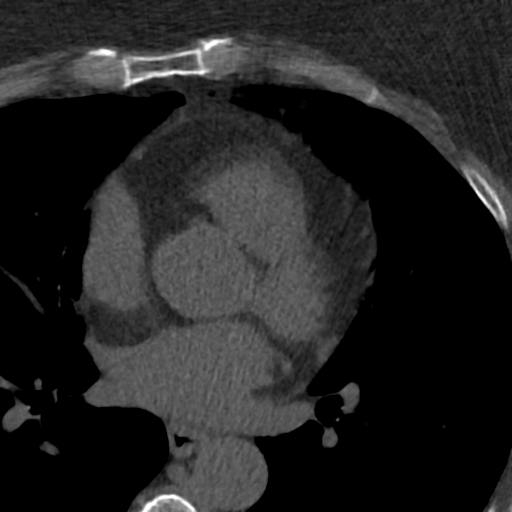
[im 29/38  vessel]
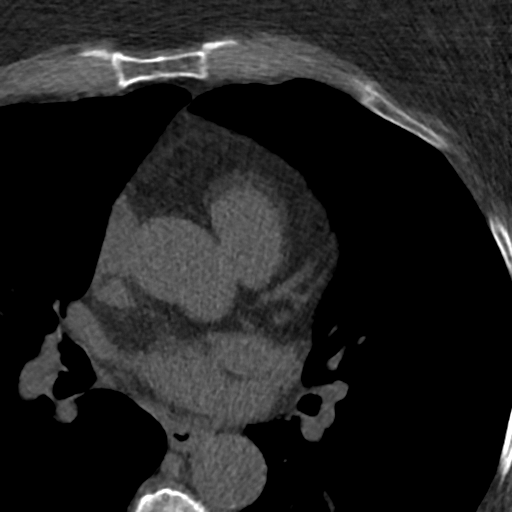
[im 33/38  vessel]
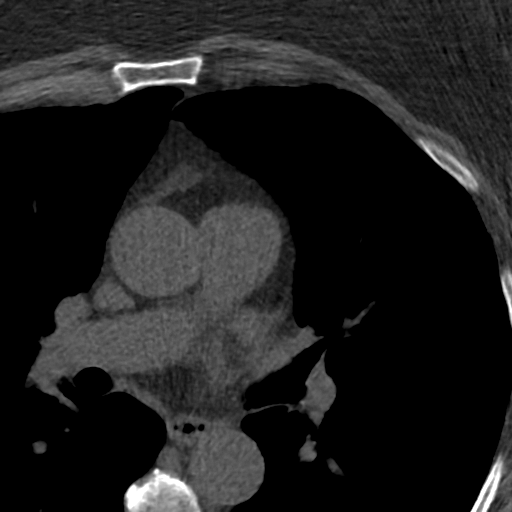

[Series 5: lung st 71 % · axial · 0.68mm/px · z∈[-182,-94]mm · 8 of 39 slices shown]
[im 5/39  lung]
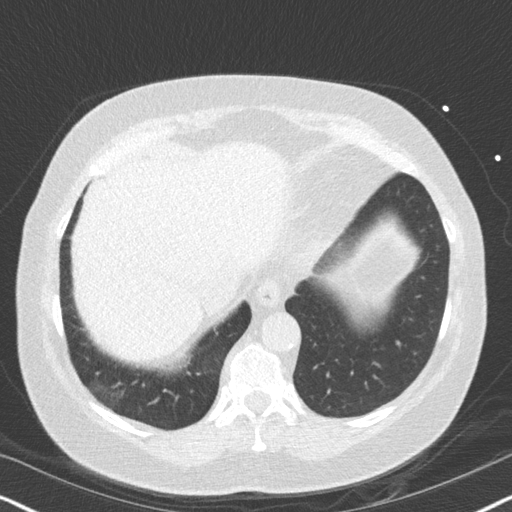
[im 9/39  lung]
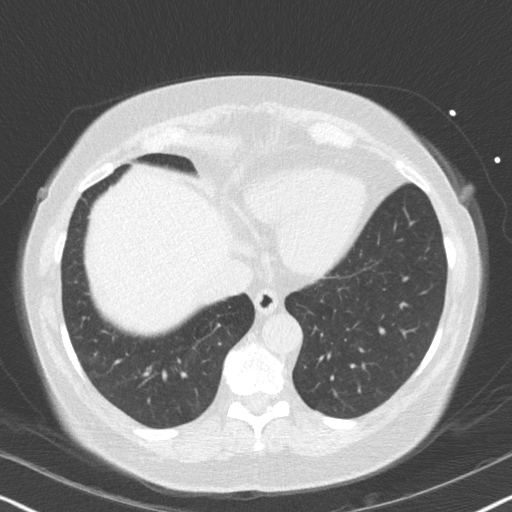
[im 13/39  lung]
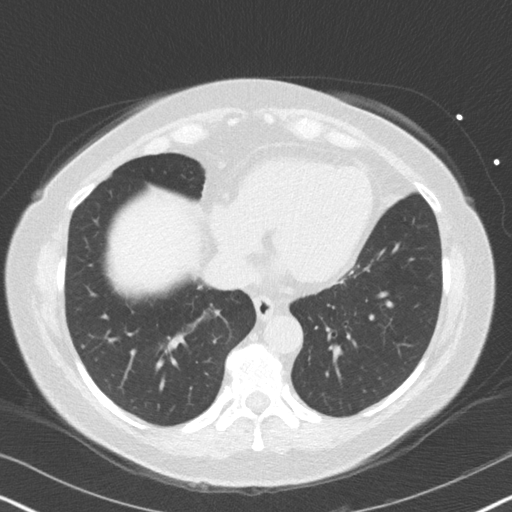
[im 17/39  lung]
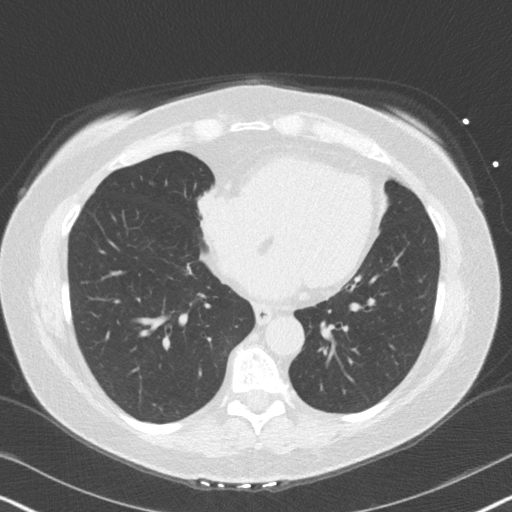
[im 22/39  lung]
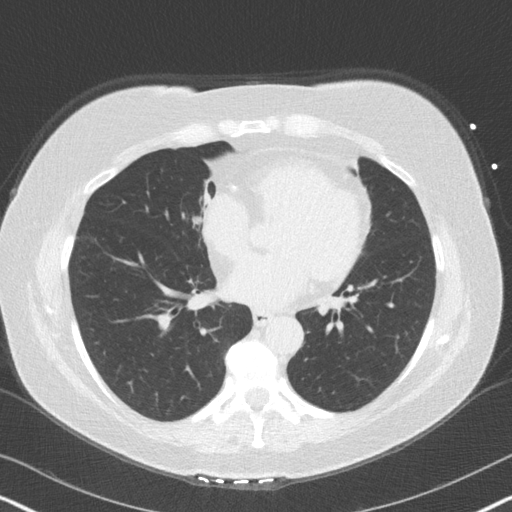
[im 26/39  lung]
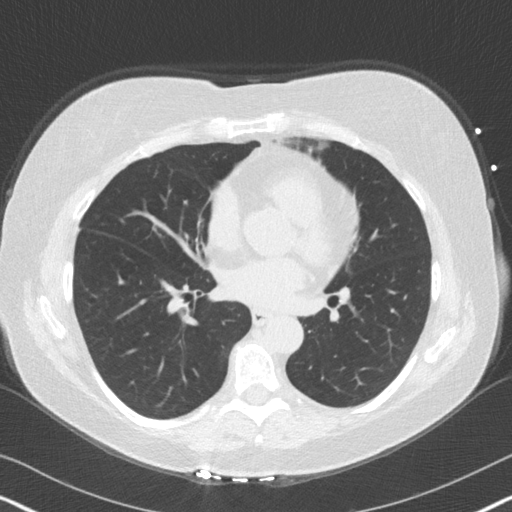
[im 30/39  lung]
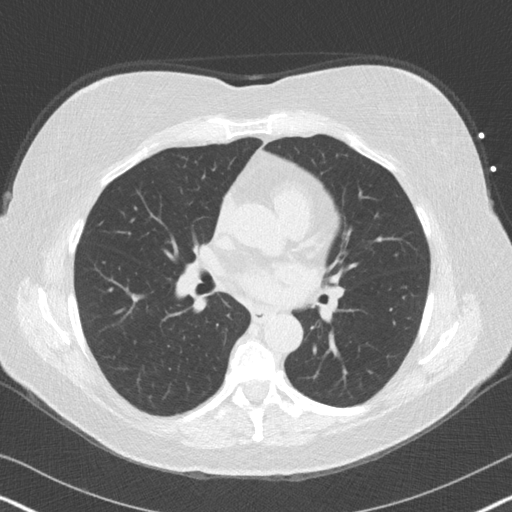
[im 34/39  lung]
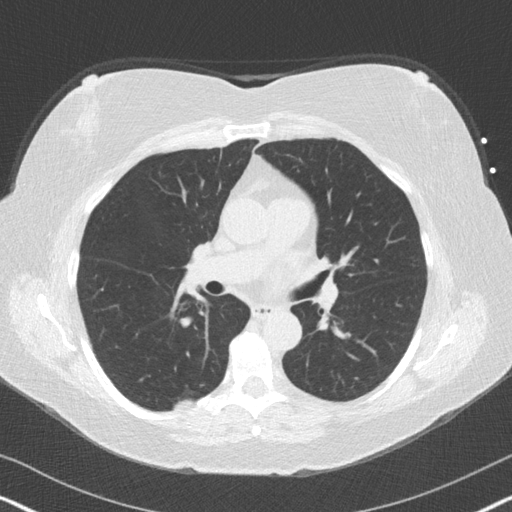

[16 of 20 positions shown; findings below may reference images not displayed]

FINDINGS: Limited view of the lung parenchyma demonstrates mild atelectasis
and pleural thickening in the medial RIGHT middle lobe similar to CT
05/18/2008. There is a nodular focus adjacent to the atelectasis
measuring 6 mm which is not clearly seen on prior (image [DATE] and
image 43/6).. Airways are normal.

Limited view of the mediastinum demonstrates no adenopathy.
Esophagus normal.

Limited view of the upper abdomen unremarkable.

Limited view of the skeleton and chest wall is unremarkable.
IMPRESSION: 1. New nodular thickening adjacent to chronic atelectasis in RIGHT
middle lobe. In patient with smoking history recommend follow-up CT
exam. Non-contrast chest CT at 6-12 months is recommended. If the
nodule is stable at time of repeat CT, then future CT at 18-24
months (from today's scan) is considered optional for low-risk
patients, but is recommended for high-risk patients. This
recommendation follows the consensus statement: Guidelines for
Management of Incidental Pulmonary Nodules Detected on CT Images:
2. No additional significant extracardiac findings.

These results will be called to the ordering clinician or
representative by the Radiologist Assistant, and communication
documented in the PACS or zVision Dashboard.
FINDINGS: Non-cardiac: See separate report from [REDACTED].

Ascending aorta: Normal diameter 3.2 cm

Pericardium: Normal

Coronary arteries: Calcium noted in proximal and mid RCA/LAD
IMPRESSION: Coronary calcium score of 97. This was 81 st percentile for age and
sex matched control.

Mose Sagastume

*** End of Addendum ***

## 2020-06-01 ENCOUNTER — Other Ambulatory Visit: Payer: Self-pay | Admitting: Internal Medicine

## 2020-06-02 ENCOUNTER — Telehealth: Payer: Self-pay | Admitting: Internal Medicine

## 2020-06-02 MED ORDER — HYDROCHLOROTHIAZIDE 12.5 MG PO CAPS: 12.5000 mg | ORAL_CAPSULE | Freq: Every day | ORAL | 3 refills | Status: DC

## 2020-06-02 NOTE — Telephone Encounter (Signed)
Reviewed chart pt is up-to-date sent ref[facility]  

## 2020-06-02 NOTE — Telephone Encounter (Signed)
   [facility] requesting order for hydrochlorothiazide (MICROZIDE) 12.5 MG capsule

## 2020-07-24 ENCOUNTER — Other Ambulatory Visit: Payer: Self-pay

## 2020-07-24 ENCOUNTER — Encounter: Payer: Self-pay | Admitting: Internal Medicine

## 2020-07-24 ENCOUNTER — Ambulatory Visit: Payer: No Typology Code available for payment source | Admitting: Internal Medicine

## 2020-07-24 VITALS — BP 120/70 | HR 75 | Ht 62.0 in | Wt 173.6 lb

## 2020-07-24 DIAGNOSIS — R931 Abnormal findings on diagnostic imaging of heart and coronary circulation: Secondary | ICD-10-CM | POA: Diagnosis not present

## 2020-07-24 DIAGNOSIS — E781 Pure hyperglyceridemia: Secondary | ICD-10-CM | POA: Diagnosis not present

## 2020-07-24 DIAGNOSIS — E785 Hyperlipidemia, unspecified: Secondary | ICD-10-CM

## 2020-07-24 MED ORDER — ROSUVASTATIN CALCIUM 5 MG PO TABS: 5.0000 mg | ORAL_TABLET | Freq: Every day | ORAL | 3 refills | Status: DC

## 2020-07-24 NOTE — Progress Notes (Signed)
LIPID CLINIC CONSULT NOTE  Chief Complaint:  Manage dyslipidemia  Primary Care Physician: Cassandria Anger, MD  Primary Cardiologist:  No primary care provider on file.  HPI:  Julia Zimmerman is a 70 y.o. female who is being seen today for the evaluation of dyslipidemia at the request of Plotnikov, Evie Lacks, MD.  This is a pleasant 70 year old female kindly referred for evaluation and management of dyslipidemia.  She has a family history of heart disease mostly in her mother who had hypertension but died of heart failure.  Her father apparently had elevated cholesterol.  More recently she had lipids performed which showed total cholesterol 233, triglycerides 357, HDL 47 and a direct LDL of 100.  She had prior calcium scoring done in 2019 which showed a calcium score of 97, 81st percentile for age and sex matched control.  She had previously seen Dr. Harrington Challenger in the past for shortness of breath and palpitations in 2018 and had nuclear stress testing which was negative for ischemia.  Unfortunately she has been intolerant of statin medications which caused bad cramping.  Recently she was recommended to start taking Vascepa 2 g twice daily but reports that she is variably compliant with this.  In the distant past she says amongst the statins that she had taken, rosuvastatin was the best tolerated.  PMHx:  Past Medical History:  Diagnosis Date  . ADD 10/30/2008   Qualifier: Diagnosis of  By: Niel Hummer MD, Lorinda Creed Allergy   . Anxiety   . ANXIETY DEPRESSION 03/29/2007   Qualifier: Diagnosis of  By: Niel Hummer MD, Lorinda Creed   . Complication of anesthesia   . Cough 04/21/2010   full eval including allergist. Working diagnosis - reflux.  . Depression   . Endometriosis   . Episodic tension type headache 11/07/2007   Qualifier: Diagnosis of  By: Niel Hummer MD, [place] hypertension, benign 05/15/2010   Qualifier: Diagnosis of  By: Elizebeth Koller MD, Mora Appl   . GERD 04/21/2010    Qualifier: Diagnosis of  By: Niel Hummer MD, Lorinda Creed   . GERD (gastroesophageal reflux disease)   . Hyperlipidemia   . HYPERLIPIDEMIA 05/15/2010   Qualifier: Diagnosis of  By: Elizebeth Koller MD, Mora Appl   . Learning disability    poor attention span and difficulty with retention  . Lumbosacral facet joint syndrome 04/11/2018  . ONYCHOMYCOSIS 10/30/2008   successfully treated with lamisil.  [name] PONV (postoperative nausea and vomiting)   . Primary localized osteoarthritis of right knee 04/11/2018  . RHINITIS 04/21/2010   Qualifier: Diagnosis of  By: Niel Hummer MD, Lorinda Creed     Past Surgical History:  Procedure Laterality Date  . CHOLECYSTECTOMY N/A 11/20/2016   Procedure: LAPAROSCOPIC CHOLECYSTECTOMY WITH INTRAOPERATIVE CHOLANGIOGRAM;  Surgeon: Leighton Ruff, MD;  Location: [facility];  Service: General;  Laterality: N/A;  . COLONOSCOPY    . G0P0    . LAPAROSCOPY  1994   x 2 for endometriosis  . LAPAROTOMY  1989   for benign tumor  . MENISCUS REPAIR Left   . NECK MASS EXCISION  1974   benign mass left  . TOTAL KNEE ARTHROPLASTY Right 04/24/2018   Procedure: TOTAL KNEE ARTHROPLASTY;  Surgeon: Elsie Saas, MD;  Location: [facility];  Service: Orthopedics;  Laterality: Right;    FAMHx:  Family History  Problem Relation Age of Onset  . Hypertension Mother   . Diabetes Mother   . Arthritis Father  gout  . Heart disease Sister        palpitations  . Cancer Neg Hx   . COPD Neg Hx   . Colon cancer Neg Hx   . Esophageal cancer Neg Hx   . Stomach cancer Neg Hx   . Rectal cancer Neg Hx     SOCHx:   reports that she quit smoking about 29 years ago. Her smoking use included cigarettes. She has a 28.50 pack-year smoking history. She has never used smokeless tobacco. She reports that she does not drink alcohol and does not use drugs.  ALLERGIES:  Allergies  Allergen Reactions  . Sulfa Antibiotics Hives  . Statins     Bad cramps  . Codeine Nausea Only  . Hydrocodone Nausea Only     ROS: Pertinent items noted in HPI and remainder of comprehensive ROS otherwise negative.  HOME MEDS: Current Outpatient Medications on File Prior to Visit  Medication Sig Dispense Refill  . alprazolam (XANAX) 2 MG tablet TAKE 1 TABLET BY MOUTH EVERY DAY AT BEDTIME AS NEEDED FOR INSOMNIA 90 tablet 1  . amphetamine-dextroamphetamine (ADDERALL XR) 5 MG 24 hr capsule Take 1 capsule (5 mg total) by mouth daily. 90 capsule 0  . b complex vitamins tablet Take 1 tablet by mouth daily. 100 tablet 3  . B Complex-C-Folic Acid (B-COMPLEX/FOLIC ACID/VITAMIN C) TBCR TAKE 1 TABLET BY MOUTH EVERY DAY 125 tablet 3  . butalbital-acetaminophen-caffeine (FIORICET) 50-325-40 MG tablet TAKE 1 TABLET BY MOUTH EVERY 8 HOURS AS NEEDED FOR HEADACHE 90 tablet 1  . diphenhydrAMINE (BENADRYL) 25 MG tablet Take 25 mg by mouth daily as needed for allergies.    . hydrochlorothiazide (MICROZIDE) 12.5 MG capsule Take 1 capsule (12.5 mg total) by mouth daily. 90 capsule 3  . icosapent Ethyl (VASCEPA) 1 g capsule Take 2 capsules (2 g total) by mouth 2 (two) times daily. 360 capsule 3  . losartan-hydrochlorothiazide (HYZAAR) 50-12.5 MG tablet Take 1 tablet by mouth daily. 90 tablet 3  . pantoprazole (PROTONIX) 40 MG tablet Take 1 tablet (40 mg total) by mouth daily. 30 tablet 11  . venlafaxine XR (EFFEXOR-XR) 150 MG 24 hr capsule TAKE 1 CAPSULE DAILY WITH  BREAKFAST. (Patient taking differently: Take by mouth 3 (three) times a week. Takes 150 MG Monday, Wednesday, and Friday) 90 capsule 3   No current facility-administered medications on file prior to visit.    LABS/IMAGING: No results found for this or any previous visit (from the past 48 hour(s)). No results found.  LIPID PANEL:    Component Value Date/Time   CHOL 233 (H) 04/24/2020 0851   TRIG 357.0 (H) 04/24/2020 0851   TRIG 235 (HH) 04/12/2006 1440   HDL 46.70 04/24/2020 0851   CHOLHDL 5 04/24/2020 0851   VLDL 71.4 (H) 04/24/2020 0851   LDLCALC 71 11/22/2012  1616   LDLDIRECT 100.0 04/24/2020 0851    WEIGHTS: Wt Readings from Last 3 Encounters:  07/24/20 173 lb 9.6 oz (78.7 kg)  04/24/20 171 lb (77.6 kg)  12/26/19 168 lb (76.2 kg)    VITALS: BP 120/70   Pulse 75   Ht 5\' 2"  (1.575 m)   Wt 173 lb 9.6 oz (78.7 kg)   SpO2 97%   BMI 31.75 kg/m   EXAM: General appearance: alert and no distress Neck: no carotid bruit, no JVD and thyroid not enlarged, symmetric, no tenderness/mass/nodules Lungs: clear to auscultation bilaterally Heart: regular rate and rhythm Abdomen: soft, non-tender; bowel sounds normal; no masses,  no  organomegaly Extremities: extremities normal, atraumatic, no cyanosis or edema Pulses: 2+ and symmetric Skin: Skin color, texture, turgor normal. No rashes or lesions Neurologic: Grossly normal Psych: Pleasant   *Examination chaperoned by Sheral Apley, RN.  EKG: Deferred  ASSESSMENT: 1. Mixed dyslipidemia with high triglycerides 2. Relative statin intolerance 3. Abnormal CAC score of 97, 1st percentile for age and sex matched control (2019)  PLAN: 1.   Mrs. Klapper has a mixed dyslipidemia with high triglycerides.  She had been relatively intolerant of statins however her LDL is not severely elevated.  Primarily her triglycerides are high.  She has been relatively inconsistent taking Vascepa, but said that she could do better with it.  I also think that she would benefit from statin therapy and since she could tolerate rosuvastatin with some low degree of side effects, I would recommend we trial her on 5 mg rosuvastatin daily.  Hopefully this, in combination with her Vascepa at 2 g twice daily, will see significant improvement in her numbers.  Repeat lipids in about 3 months and follow-up with me at that time.  Thanks again for the kind referral  Pixie Casino, MD, FACC, [place] Director of the [facility]  Cardiovascular Risk Reduction Clinic Diplomate of the  American Board of Clinical Lipidology Attending Cardiologist  Direct Dial: [phone]  Fax: [phone]  Website:  [email or web address] 07/24/2020, 6:05 PM

## 2020-07-24 NOTE — Patient Instructions (Signed)
Medication Instructions:  START crestor 5mg  daily  *If you need a refill on your cardiac medications before your next appointment, please call your pharmacy*   Lab Work: FASTING lab work in 3 months to check cholesterol   If you have labs (blood work) drawn today and your tests are completely normal, you will receive your results only by: [name] MyChart Message (if you have MyChart) OR . A paper copy in the mail If you have any lab test that is abnormal or we need to change your treatment, we will call you to review the results.   Testing/Procedures: NONE   Follow-Up: At [facility], you and your health needs are our priority.  As part of our continuing mission to provide you with exceptional heart care, we have created designated Provider Care Teams.  These Care Teams include your primary Cardiologist (physician) and Advanced Practice Providers (APPs -  Physician Assistants and Nurse Practitioners) who all work together to provide you with the care you need, when you need it.  We recommend signing up for the patient portal called "MyChart".  Sign up information is provided on this After Visit Summary.  MyChart is used to connect with patients for Virtual Visits (Telemedicine).  Patients are able to view lab/test results, encounter notes, upcoming appointments, etc.  Non-urgent messages can be sent to your provider as well.   To learn more about what you can do with MyChart, go to [email or web address].    Your next appointment:   3 month(s) - lipid clinic  The format for your next appointment:   In Person or Virtual   Provider:   K. Mali Hilty, MD   Other Instructions

## 2020-07-25 ENCOUNTER — Telehealth: Payer: Self-pay | Admitting: Internal Medicine

## 2020-07-25 MED ORDER — BUTALBITAL-APAP-CAFFEINE 50-325-40 MG PO TABS: ORAL_TABLET | ORAL | 1 refills | Status: DC

## 2020-07-25 NOTE — Telephone Encounter (Signed)
Patient requesting refill for butalbital-acetaminophen-caffeine (FIORICET) 50-325-40 MG tablet   pharmacy [id number] [place], [address]. AT [address]

## 2020-07-25 NOTE — Telephone Encounter (Signed)
[place] visit every 6 months Thanks

## 2020-07-25 NOTE — Telephone Encounter (Signed)
Called pt no answer LMOM W/MD RESPONSE.../LMB

## 2020-08-11 ENCOUNTER — Encounter: Payer: Self-pay | Admitting: Internal Medicine

## 2020-09-02 LAB — HM MAMMOGRAPHY

## 2020-09-03 ENCOUNTER — Encounter: Payer: Self-pay | Admitting: Internal Medicine

## 2020-09-22 ENCOUNTER — Other Ambulatory Visit: Payer: Self-pay | Admitting: Internal Medicine

## 2020-09-24 ENCOUNTER — Telehealth: Payer: Self-pay | Admitting: Internal Medicine

## 2020-09-24 NOTE — Telephone Encounter (Signed)
1.Medication Requested: amphetamine-dextroamphetamine (ADDERALL XR) 5 MG 24 hr capsule    2. Pharmacy (Name[facility] [id number] [place], [address] [place]. AT [address]  3. On Med List: yes   4. Last Visit with PCP: 04-24-20  5. Next visit date with PCP: 10-22-20   Agent: Please be advised that RX refills may take up to 3 business days. We ask that you follow-up with your pharmacy.

## 2020-09-24 NOTE — Telephone Encounter (Signed)
Needs office visit every 3 months.  Okay to schedule a virtual appointment.  Thanks

## 2020-09-24 NOTE — Telephone Encounter (Signed)
Check [place] registry last filled 04/24/2020.Marland KitchenJohny Zimmerman

## 2020-09-24 NOTE — Telephone Encounter (Signed)
Pt has an appt already set-up for 10/22/20.Marland KitchenJohny Zimmerman

## 2020-09-28 ENCOUNTER — Other Ambulatory Visit: Payer: Self-pay | Admitting: Internal Medicine

## 2020-10-22 ENCOUNTER — Encounter: Payer: Self-pay | Admitting: Internal Medicine

## 2020-10-22 ENCOUNTER — Other Ambulatory Visit: Payer: Self-pay

## 2020-10-22 ENCOUNTER — Ambulatory Visit: Payer: No Typology Code available for payment source | Admitting: Internal Medicine

## 2020-10-22 DIAGNOSIS — F32A Depression, unspecified: Secondary | ICD-10-CM

## 2020-10-22 DIAGNOSIS — R519 Headache, unspecified: Secondary | ICD-10-CM

## 2020-10-22 DIAGNOSIS — F419 Anxiety disorder, unspecified: Secondary | ICD-10-CM

## 2020-10-22 DIAGNOSIS — I10 Essential (primary) hypertension: Secondary | ICD-10-CM

## 2020-10-22 DIAGNOSIS — F988 Other specified behavioral and emotional disorders with onset usually occurring in childhood and adolescence: Secondary | ICD-10-CM | POA: Diagnosis not present

## 2020-10-22 MED ORDER — ALPRAZOLAM 2 MG PO TABS: ORAL_TABLET | ORAL | 0 refills | Status: DC

## 2020-10-22 MED ORDER — EMGALITY 120 MG/ML ~~LOC~~ SOAJ: 120.0000 mg | SUBCUTANEOUS | 11 refills | Status: DC

## 2020-10-22 MED ORDER — AMPHETAMINE-DEXTROAMPHET ER 5 MG PO CP24: 5.0000 mg | ORAL_CAPSULE | Freq: Every day | ORAL | 0 refills | Status: DC

## 2020-10-22 MED ORDER — EMGALITY 120 MG/ML ~~LOC~~ SOAJ: SUBCUTANEOUS | 0 refills | Status: DC

## 2020-10-22 NOTE — Assessment & Plan Note (Signed)
Will try Emgality Cont w/Fioricet - wean off

## 2020-10-22 NOTE — Patient Instructions (Signed)
Galcanezumab injection What is this medication? GALCANEZUMAB (gal ka NEZ ue mab) is used to prevent migraines and treat clusterheadaches. This medicine may be used for other purposes; ask your health care provider orpharmacist if you have questions. COMMON BRAND NAME(S): Emgality What should I tell my care team before I take this medication? They need to know if you have any of these conditions: an unusual or allergic reaction to galcanezumab, other medicines, foods, dyes, or preservatives pregnant or trying to get pregnant breast-feeding How should I use this medication? This medicine is for injection under the skin. You will be taught how to prepare and give this medicine. Use exactly as directed. Take your medicine atregular intervals. Do not take your medicine more often than directed. It is important that you put your used needles and syringes in a special sharps container. Do not put them in a trash can. If you do not have a sharpscontainer, call your pharmacist or healthcare provider to get one. Talk to your pediatrician regarding the use of this medicine in children.Special care may be needed. Overdosage: If you think you have taken too much of this medicine contact apoison control center or emergency room at once. NOTE: This medicine is only for you. Do not share this medicine with others. What if I miss a dose? If you miss a dose, take it as soon as you can. If it is almost time for yournext dose, take only that dose. Do not take double or extra doses. What may interact with this medication? Interactions are not expected. This list may not describe all possible interactions. Give your health care provider a list of all the medicines, herbs, non-prescription drugs, or dietary supplements you use. Also tell them if you smoke, drink alcohol, or use illegaldrugs. Some items may interact with your medicine. What should I watch for while using this medication? Tell your doctor or healthcare  professional if your symptoms do not start toget better or if they get worse. What side effects may I notice from receiving this medication? Side effects that you should report to your doctor or health care professionalas soon as possible: allergic reactions like skin rash, itching or hives, swelling of the face, lips, or tongue Side effects that usually do not require medical attention (report these toyour doctor or health care professional if they continue or are bothersome): pain, redness, or irritation at site where injected This list may not describe all possible side effects. Call your doctor for medical advice about side effects. You may report side effects to FDA at1-800-FDA-1088. Where should I keep my medication? Keep out of the reach of children. You will be instructed on how to store this medicine. Throw away any unusedmedicine after the expiration date on the label. NOTE: This sheet is a summary. It may not cover all possible information. If you have questions about this medicine, talk to your doctor, pharmacist, orhealth care provider.  2022 Elsevier/Gold Standard (2017-10-05 12:03:23)

## 2020-10-22 NOTE — Assessment & Plan Note (Signed)
Losartan HCTZ

## 2020-10-22 NOTE — Progress Notes (Signed)
Subjective:  Patient ID: Julia Zimmerman, female    DOB: 05/10/1950  Age: 70 y.o. MRN: [id number]  CC: Follow-up (6 MONTH  F/U)   HPI CHERYLANN HOBDAY presents for a HA, ADD, depression f/u  Outpatient Medications Prior to Visit  Medication Sig Dispense Refill   alprazolam (XANAX) 2 MG tablet TAKE 1 TABLET BY MOUTH AT BEDTIME AS NEEDED FOR INSOMNIA 90 tablet 0   amphetamine-dextroamphetamine (ADDERALL XR) 5 MG 24 hr capsule Take 1 capsule (5 mg total) by mouth daily. 90 capsule 0   b complex vitamins tablet Take 1 tablet by mouth daily. 100 tablet 3   B Complex-C-Folic Acid (B-COMPLEX/FOLIC ACID/VITAMIN C) TBCR TAKE 1 TABLET BY MOUTH EVERY DAY 125 tablet 3   butalbital-acetaminophen-caffeine (FIORICET) 50-325-40 MG tablet TAKE 1 TABLET BY MOUTH 3 TIMES A DAY AS NEEDED. Office visit every 6 months 90 tablet 1   diphenhydrAMINE (BENADRYL) 25 MG tablet Take 25 mg by mouth daily as needed for allergies.     hydrochlorothiazide (MICROZIDE) 12.5 MG capsule Take 1 capsule (12.5 mg total) by mouth daily. 90 capsule 3   icosapent Ethyl (VASCEPA) 1 g capsule Take 2 capsules (2 g total) by mouth 2 (two) times daily. 360 capsule 3   losartan-hydrochlorothiazide (HYZAAR) 50-12.5 MG tablet TAKE 1 TABLET BY MOUTH EVERY DAY 90 tablet 3   pantoprazole (PROTONIX) 40 MG tablet Take 1 tablet (40 mg total) by mouth daily. 30 tablet 11   rosuvastatin (CRESTOR) 5 MG tablet Take 1 tablet (5 mg total) by mouth daily. 90 tablet 3   venlafaxine XR (EFFEXOR-XR) 150 MG 24 hr capsule TAKE 1 CAPSULE DAILY WITH  BREAKFAST. (Patient taking differently: Take by mouth 3 (three) times a week. Takes 150 MG Monday, Wednesday, and Friday) 90 capsule 3   No facility-administered medications prior to visit.    ROS: Review of Systems  Constitutional:  Negative for activity change, appetite change, chills, diaphoresis, fatigue, fever and unexpected weight change.  HENT:  Negative for congestion, dental problem, ear pain,  hearing loss, mouth sores, postnasal drip, sinus pressure, sneezing, sore throat and voice change.   Eyes:  Negative for pain and visual disturbance.  Respiratory:  Negative for cough, chest tightness, wheezing and stridor.   Cardiovascular:  Negative for chest pain, palpitations and leg swelling.  Gastrointestinal:  Negative for abdominal distention, abdominal pain, blood in stool, nausea, rectal pain and vomiting.  Genitourinary:  Negative for difficulty urinating, dysuria and frequency.  Musculoskeletal:  Positive for arthralgias. Negative for back pain, gait problem, joint swelling and neck pain.  Skin:  Negative for color change, pallor, rash and wound.  Neurological:  Negative for dizziness, tremors, syncope, speech difficulty, weakness, light-headedness, numbness and headaches.  Hematological:  Negative for adenopathy.  Psychiatric/Behavioral:  Negative for behavioral problems, confusion, decreased concentration, dysphoric mood, hallucinations, sleep disturbance and suicidal ideas. The patient is not nervous/anxious and is not hyperactive.    Objective:  BP 132/62 (BP Location: Left Arm)   Pulse 82   Temp 98.1 F (36.7 C) (Oral)   Ht 5\' 2"  (1.575 m)   Wt 173 lb 9.6 oz (78.7 kg)   SpO2 95%   BMI 31.75 kg/m   BP Readings from Last 3 Encounters:  10/22/20 132/62  07/24/20 120/70  04/24/20 120/82    Wt Readings from Last 3 Encounters:  10/22/20 173 lb 9.6 oz (78.7 kg)  07/24/20 173 lb 9.6 oz (78.7 kg)  04/24/20 171 lb (77.6 kg)  Physical Exam Constitutional:      General: She is not in acute distress.    Appearance: She is well-developed.  HENT:     Head: Normocephalic.     Right Ear: External ear normal.     Left Ear: External ear normal.     Nose: Nose normal.  Eyes:     General:        Right eye: No discharge.        Left eye: No discharge.     Conjunctiva/sclera: Conjunctivae normal.     Pupils: Pupils are equal, round, and reactive to light.  Neck:      Thyroid: No thyromegaly.     Vascular: No JVD.     Trachea: No tracheal deviation.  Cardiovascular:     Rate and Rhythm: Normal rate and regular rhythm.     Heart sounds: Normal heart sounds.  Pulmonary:     Effort: No respiratory distress.     Breath sounds: No stridor. No wheezing.  Abdominal:     General: Bowel sounds are normal. There is no distension.     Palpations: Abdomen is soft. There is no mass.     Tenderness: There is no abdominal tenderness. There is no guarding or rebound.  Musculoskeletal:        General: No tenderness.     Cervical back: Normal range of motion and neck supple. No rigidity.  Lymphadenopathy:     Cervical: No cervical adenopathy.  Skin:    Findings: No erythema or rash.  Neurological:     Mental Status: She is oriented to person, place, and time.     Cranial Nerves: No cranial nerve deficit.     Motor: No abnormal muscle tone.     Coordination: Coordination normal.     Deep Tendon Reflexes: Reflexes normal.  Psychiatric:        Behavior: Behavior normal.        Thought Content: Thought content normal.        Judgment: Judgment normal.    Lab Results  Component Value Date   WBC 6.6 04/24/2020   HGB 14.0 04/24/2020   HCT 40.7 04/24/2020   PLT 323.0 04/24/2020   GLUCOSE 90 04/24/2020   CHOL 233 (H) 04/24/2020   TRIG 357.0 (H) 04/24/2020   HDL 46.70 04/24/2020   LDLDIRECT 100.0 04/24/2020   LDLCALC 71 11/22/2012   ALT 19 04/24/2020   AST 18 04/24/2020   NA 138 04/24/2020   K 3.7 04/24/2020   CL 102 04/24/2020   CREATININE 0.63 04/24/2020   BUN 13 04/24/2020   CO2 28 04/24/2020   TSH 1.57 04/24/2020   INR 0.97 04/14/2018    CT Chest Wo Contrast  Result Date: 04/28/2020 CLINICAL DATA:  70 year old female with persistent cough. Cardiac CT in 2019 demonstrating nodular changes and chronic atelectasis in the right middle lobe. Former smoker. EXAM: CT CHEST WITHOUT CONTRAST TECHNIQUE: Multidetector CT imaging of the chest was performed  following the standard protocol without IV contrast. COMPARISON:  CT 04/17/2018.  Chest CT 03/18/2009. FINDINGS: Cardiovascular: Calcified coronary artery atherosclerosis and/or stents (series 2, image 71). No cardiomegaly or pericardial effusion. Calcified aortic atherosclerosis. Vascular patency is not evaluated in the absence of IV contrast. Mediastinum/Nodes: Negative.  No mediastinal lymphadenopathy. Lungs/Pleura: Major airways are patent. Chronic atelectasis in the medial segment of the right middle lobe abutting the mediastinal contour, present in 2010 but increased since that time. Small nodular component again noted on series 3, image 83, and  this appearance is unchanged since December 2019. Minimal scarring or atelectasis in the medial lingula. A tiny nodule in the superior segment of the right lower lobe on series 3, image 79 is stable since 2010 and benign. Mild curvilinear scarring in the anterior right upper lobe has progressed since 2010 but appears benign and stable since 2019 (series 3, image 52). Minimal nodularity along the inferior pulmonary ligament along the surface of the diaphragm also appears benign. No pleural effusion or other abnormal pulmonary opacity. Upper Abdomen: Surgically absent gallbladder. Negative visible noncontrast liver, spleen, pancreas, adrenal glands and kidneys. Possible small gastric hiatal hernia but otherwise negative visible stomach. Mild diverticulosis of large bowel at the hepatic flexure. Musculoskeletal: No acute osseous abnormality identified. IMPRESSION: 1. Chronic atelectasis in the medial segment of the right middle lobe with a small nodular component is stable for two years and therefore considered benign. No acute or suspicious pulmonary finding. 2. Calcified coronary artery and Aortic Atherosclerosis (ICD10-I70.0). Electronically Signed   By: Genevie Ann M.D.   On: 04/28/2020 11:36    Assessment & Plan:     Follow-up: No follow-ups on file.  Walker Kehr, MD

## 2020-10-28 NOTE — Assessment & Plan Note (Signed)
Continue with Adderall - tolerating a low dose okay.  Potential benefits of a long term amphetamines  use as well as potential risks  and complications were explained to the patient and were aknowledged.

## 2020-10-28 NOTE — Assessment & Plan Note (Signed)
Continue with Effexor. Continue as needed Xanax  Potential benefits of a long term benzodiazepines  use as well as potential risks  and complications were explained to the patient and were aknowledged.

## 2020-10-31 ENCOUNTER — Ambulatory Visit: Payer: No Typology Code available for payment source | Admitting: Internal Medicine

## 2020-11-02 ENCOUNTER — Other Ambulatory Visit: Payer: Self-pay | Admitting: Gastroenterology

## 2020-11-17 ENCOUNTER — Other Ambulatory Visit: Payer: Self-pay | Admitting: Internal Medicine

## 2020-11-17 ENCOUNTER — Other Ambulatory Visit: Payer: Self-pay | Admitting: Gastroenterology

## 2020-12-05 LAB — LIPID PANEL
Chol/HDL Ratio: 3.2 ratio (ref 0.0–4.4)
Cholesterol, Total: 137 mg/dL (ref 100–199)
HDL: 43 mg/dL (ref >39)
LDL Chol Calc (NIH): 63 mg/dL (ref 0–99)
Triglycerides: 184 mg/dL — ABNORMAL HIGH (ref 0–149)
VLDL Cholesterol Cal: 31 mg/dL (ref 5–40)

## 2020-12-16 ENCOUNTER — Encounter: Payer: Self-pay | Admitting: Internal Medicine

## 2020-12-16 ENCOUNTER — Ambulatory Visit: Payer: No Typology Code available for payment source | Admitting: Internal Medicine

## 2020-12-16 ENCOUNTER — Other Ambulatory Visit: Payer: Self-pay

## 2020-12-16 VITALS — BP 110/70 | HR 70 | Ht 62.0 in | Wt 174.0 lb

## 2020-12-16 DIAGNOSIS — R931 Abnormal findings on diagnostic imaging of heart and coronary circulation: Secondary | ICD-10-CM | POA: Diagnosis not present

## 2020-12-16 DIAGNOSIS — E781 Pure hyperglyceridemia: Secondary | ICD-10-CM | POA: Diagnosis not present

## 2020-12-16 DIAGNOSIS — E785 Hyperlipidemia, unspecified: Secondary | ICD-10-CM

## 2020-12-16 MED ORDER — VASCEPA 0.5 G PO CAPS: 4.0000 | ORAL_CAPSULE | Freq: Two times a day (BID) | ORAL | 11 refills | Status: DC

## 2020-12-16 MED ORDER — ROSUVASTATIN CALCIUM 5 MG PO TABS: 5.0000 mg | ORAL_TABLET | Freq: Every day | ORAL | 3 refills | Status: DC

## 2020-12-16 NOTE — Patient Instructions (Signed)
Medication Instructions:   VASCEPTA 500 MG CAPSULES TAKE 4 CAP TWICE DAILY  *If you need a refill on your cardiac medications before your next appointment, please call your pharmacy*   Lab Work:  Your physician recommends that you return for lab work in: 6 MONTHS-FASTING-PRIOR [place]  If you have labs (blood work) drawn today and your tests are completely normal, you will receive your results only by: [place] (if you have MyChart) OR A paper copy in the mail If you have any lab test that is abnormal or we need to change your treatment, we will call you to review the results.    Follow-Up: At [facility], you and your health needs are our priority.  As part of our continuing mission to provide you with exceptional heart care, we have created designated Provider Care Teams.  These Care Teams include your primary Cardiologist (physician) and Advanced Practice Providers (APPs -  Physician Assistants and Nurse Practitioners) who all work together to provide you with the care you need, when you need it.  We recommend signing up for the patient portal called "MyChart".  Sign up information is provided on this After Visit Summary.  MyChart is used to connect with patients for Virtual Visits (Telemedicine).  Patients are able to view lab/test results, encounter notes, upcoming appointments, etc.  Non-urgent messages can be sent to your provider as well.   To learn more about what you can do with MyChart, go to [email or web address].    Your next appointment:   6 month(s)  The format for your next appointment:   In Person  Provider:   K. Mali Hilty, MD

## 2020-12-16 NOTE — Progress Notes (Signed)
LIPID CLINIC CONSULT NOTE  Chief Complaint:  Manage dyslipidemia  Primary Care Physician: Cassandria Anger, MD  Primary Cardiologist:  None  HPI:  Julia Zimmerman is a 70 y.o. female who is being seen today for the evaluation of dyslipidemia at the request of Plotnikov, Evie Lacks, MD.  This is a pleasant 70 year old female kindly referred for evaluation and management of dyslipidemia.  She has a family history of heart disease mostly in her mother who had hypertension but died of heart failure.  Her father apparently had elevated cholesterol.  More recently she had lipids performed which showed total cholesterol 233, triglycerides 357, HDL 47 and a direct LDL of 100.  She had prior calcium scoring done in 2019 which showed a calcium score of 97, 81st percentile for age and sex matched control.  She had previously seen Dr. Harrington Challenger in the past for shortness of breath and palpitations in 2018 and had nuclear stress testing which was negative for ischemia.  Unfortunately she has been intolerant of statin medications which caused bad cramping.  Recently she was recommended to start taking Vascepa 2 g twice daily but reports that she is variably compliant with this.  In the distant past she says amongst the statins that she had taken, rosuvastatin was the best tolerated.  12/16/2020  Ms. Berenguer returns today for follow-up.  Her cholesterol has come down quite a bit.  She is now on 5 mg rosuvastatin.  She seems to be tolerating this.  LDL is 63, that is down from 100 previously.  Total cholesterol 137, triglycerides improved to 184 from 357 and HDL of 43.  She reports not strict compliance with Vascepa due to the issue of larger capsule size.  We discussed the possibility of switching her to the smaller capsule size which may be better tolerated.   PMHx:  Past Medical History:  Diagnosis Date   ADD 10/30/2008   Qualifier: Diagnosis of  By: Niel Hummer MD, Lorinda Creed    Allergy    Anxiety     ANXIETY DEPRESSION 03/29/2007   Qualifier: Diagnosis of  By: Niel Hummer MD, Lorinda Creed    Complication of anesthesia    Cough 04/21/2010   full eval including allergist. Working diagnosis - reflux.   Depression    Endometriosis    Episodic tension type headache 11/07/2007   Qualifier: Diagnosis of  By: Niel Hummer MD, Lorinda Creed    Essential hypertension, benign 05/15/2010   Qualifier: Diagnosis of  By: Elizebeth Koller MD, Mora Appl    GERD 04/21/2010   Qualifier: Diagnosis of  By: Niel Hummer MD, Lorinda Creed    GERD (gastroesophageal reflux disease)    Hyperlipidemia    HYPERLIPIDEMIA 05/15/2010   Qualifier: Diagnosis of  By: Elizebeth Koller MD, Mora Appl    Learning disability    poor attention span and difficulty with retention   Lumbosacral facet joint syndrome 04/11/2018   ONYCHOMYCOSIS 10/30/2008   successfully treated with lamisil.   PONV (postoperative nausea and vomiting)    Primary localized osteoarthritis of right knee 04/11/2018   RHINITIS 04/21/2010   Qualifier: Diagnosis of  By: Niel Hummer MD, Lorinda Creed     Past Surgical History:  Procedure Laterality Date   CHOLECYSTECTOMY N/A 11/20/2016   Procedure: LAPAROSCOPIC CHOLECYSTECTOMY WITH INTRAOPERATIVE CHOLANGIOGRAM;  Surgeon: Leighton Ruff, MD;  Location: [facility];  Service: General;  Laterality: N/A;   [place]  x 2 for endometriosis   LAPAROTOMY  1989   for benign tumor   MENISCUS REPAIR Left    NECK MASS EXCISION  1974   benign mass left   TOTAL KNEE ARTHROPLASTY Right 04/24/2018   Procedure: TOTAL KNEE ARTHROPLASTY;  Surgeon: Elsie Saas, MD;  Location: [facility];  Service: Orthopedics;  Laterality: Right;    FAMHx:  Family History  Problem Relation Age of Onset   Hypertension Mother    Diabetes Mother    Arthritis Father        gout   Heart disease Sister        palpitations   Cancer Neg Hx    COPD Neg Hx    Colon cancer Neg Hx    Esophageal cancer Neg Hx    Stomach cancer Neg Hx     Rectal cancer Neg Hx     SOCHx:   reports that she quit smoking about 30 years ago. Her smoking use included cigarettes. She has a 28.50 pack-year smoking history. She has never used smokeless tobacco. She reports that she does not drink alcohol and does not use drugs.  ALLERGIES:  Allergies  Allergen Reactions   Sulfa Antibiotics Hives   Statins     Bad cramps   Codeine Nausea Only   Hydrocodone Nausea Only    ROS: Pertinent items noted in HPI and remainder of comprehensive ROS otherwise negative.  HOME MEDS: Current Outpatient Medications on File Prior to Visit  Medication Sig Dispense Refill   alprazolam (XANAX) 2 MG tablet TAKE 1 TABLET BY MOUTH AT BEDTIME AS NEEDED FOR INSOMNIA 90 tablet 0   amphetamine-dextroamphetamine (ADDERALL XR) 5 MG 24 hr capsule Take 1 capsule (5 mg total) by mouth daily. 90 capsule 0   B Complex-C-Folic Acid (B-COMPLEX/FOLIC ACID/VITAMIN C) TBCR TAKE 1 TABLET BY MOUTH EVERY DAY 125 tablet 3   butalbital-acetaminophen-caffeine (FIORICET) 50-325-40 MG tablet TAKE 1 TABLET BY MOUTH 3 TIMES A DAY AS NEEDED. OFFICE VISIT EVERY 6 MONTHS 90 tablet 1   diphenhydrAMINE (BENADRYL) 25 MG tablet Take 25 mg by mouth daily as needed for allergies.     Galcanezumab-gnlm (EMGALITY) 120 MG/ML SOAJ Inject 120 mg into the skin every 30 (thirty) days. 1 mL 11   hydrochlorothiazide (MICROZIDE) 12.5 MG capsule Take 1 capsule (12.5 mg total) by mouth daily. 90 capsule 3   icosapent Ethyl (VASCEPA) 1 g capsule Take 2 capsules (2 g total) by mouth 2 (two) times daily. 360 capsule 3   losartan-hydrochlorothiazide (HYZAAR) 50-12.5 MG tablet TAKE 1 TABLET BY MOUTH EVERY DAY 90 tablet 3   pantoprazole (PROTONIX) 40 MG tablet TAKE 1 TABLET BY MOUTH EVERY DAY 90 tablet 0   venlafaxine XR (EFFEXOR-XR) 150 MG 24 hr capsule TAKE 1 CAPSULE DAILY WITH  BREAKFAST. (Patient taking differently: Take by mouth 3 (three) times a week. Takes 150 MG Monday, Wednesday, and Friday) 90 capsule 3    rosuvastatin (CRESTOR) 5 MG tablet Take 1 tablet (5 mg total) by mouth daily. 90 tablet 3   No current facility-administered medications on file prior to visit.    LABS/IMAGING: No results found for this or any previous visit (from the past 48 hour(s)). No results found.  LIPID PANEL:    Component Value Date/Time   CHOL 137 12/05/2020 0823   TRIG 184 (H) 12/05/2020 0823   TRIG 235 (HH) 04/12/2006 1440   HDL 43 12/05/2020 0823   CHOLHDL 3.2 12/05/2020 0823   CHOLHDL 5 04/24/2020 [id number]  VLDL 71.4 (H) 04/24/2020 0851   LDLCALC 63 12/05/2020 0823   LDLDIRECT 100.0 04/24/2020 0851    WEIGHTS: Wt Readings from Last 3 Encounters:  12/16/20 174 lb (78.9 kg)  10/22/20 173 lb 9.6 oz (78.7 kg)  07/24/20 173 lb 9.6 oz (78.7 kg)    VITALS: BP 110/70 (BP Location: Left Arm)   Pulse 70   Ht '5\' 2"'$  (1.575 m)   Wt 174 lb (78.9 kg)   SpO2 98%   BMI 31.83 kg/m   EXAM: Deferred    EKG: Deferred  ASSESSMENT: Mixed dyslipidemia with high triglycerides Relative statin intolerance Abnormal CAC score of 97, 1st percentile for age and sex matched control (2019)  PLAN: 1.   Mrs. Nisly has had quite a significant improvement in her lipids I think primarily attributable to low-dose rosuvastatin.  She seems to be tolerating this well.  Triglycerides have come down near 50% however her Vascepa compliance is suboptimal.  Size of the capsule is an issue for her and we will go ahead and switch her to the 500 mg capsule for which she will have to take 4 twice daily.  The capsules cannot be split or punctured.  Follow-up with me in a repeat lipid in 6 months or sooner as necessary.  Pixie Casino, MD, [facility], [place] Director of the Advanced Lipid Disorders &  Cardiovascular Risk Reduction Clinic Diplomate of the American Board of Clinical Lipidology Attending Cardiologist  Direct Dial: [phone]  Fax: [phone]  Website:  [email or web address] 12/16/2020, 11:45 AM

## 2021-01-12 ENCOUNTER — Other Ambulatory Visit: Payer: Self-pay | Admitting: Internal Medicine

## 2021-01-29 ENCOUNTER — Other Ambulatory Visit: Payer: Self-pay | Admitting: Physician Assistant

## 2021-01-29 DIAGNOSIS — M25551 Pain in right hip: Secondary | ICD-10-CM

## 2021-02-02 ENCOUNTER — Other Ambulatory Visit: Payer: Self-pay

## 2021-02-02 ENCOUNTER — Ambulatory Visit
Admission: RE | Admit: 2021-02-02 | Discharge: 2021-02-02 | Disposition: A | Discharge status: Home / Self Care | Payer: No Typology Code available for payment source | Source: Ambulatory Visit | Attending: Physician Assistant | Admitting: Physician Assistant

## 2021-02-02 DIAGNOSIS — M25551 Pain in right hip: Secondary | ICD-10-CM

## 2021-02-02 MED ORDER — METHYLPREDNISOLONE ACETATE 40 MG/ML INJ SUSP (RADIOLOG
80.0000 mg | Freq: Once | INTRAMUSCULAR | Status: AC
  Administered 2021-02-02: 80 mg via INTRA_ARTICULAR

## 2021-02-02 MED ORDER — IOPAMIDOL (ISOVUE-M 200) INJECTION 41%
1.0000 mL | Freq: Once | INTRAMUSCULAR | Status: AC
  Administered 2021-02-02: 1 mL via INTRA_ARTICULAR

## 2021-03-12 ENCOUNTER — Other Ambulatory Visit: Payer: Self-pay | Admitting: Internal Medicine

## 2021-03-29 ENCOUNTER — Other Ambulatory Visit: Payer: Self-pay | Admitting: Internal Medicine

## 2021-04-02 HISTORY — PX: TOTAL HIP ARTHROPLASTY: SHX124

## 2021-04-13 ENCOUNTER — Other Ambulatory Visit: Payer: Self-pay | Admitting: Gastroenterology

## 2021-04-14 ENCOUNTER — Telehealth: Payer: Self-pay | Admitting: Internal Medicine

## 2021-04-14 NOTE — Telephone Encounter (Signed)
Patient calling in  Says she spoke w/ [facility] & they advised her that they sent [place] a medical release form to be completed by provider bc patient is scheduled to have surgery 12.28.22  [facility]- [phone]  Surgical Coordinator - [place] Chard [phone]  Please confirm if we have received form & let patient know [phone]

## 2021-04-15 NOTE — Telephone Encounter (Signed)
Called pt concerning msg below.. Inform her that she need to have appt b4 MD can filled out form and since she is seeing him on 12/27 that he can fill out at that time. Pt states [facility] inform her that it will be too late that they need the form prior to surgery. Inform pt we will have to work her in just for a pre-op visit. Made appt for 04/21/21 @ 11:30.Marland KitchenJohny Chess

## 2021-04-21 ENCOUNTER — Other Ambulatory Visit: Payer: Self-pay | Admitting: Internal Medicine

## 2021-04-21 ENCOUNTER — Encounter: Payer: Self-pay | Admitting: Internal Medicine

## 2021-04-21 ENCOUNTER — Ambulatory Visit (INDEPENDENT_AMBULATORY_CARE_PROVIDER_SITE_OTHER): Payer: No Typology Code available for payment source

## 2021-04-21 ENCOUNTER — Other Ambulatory Visit: Payer: Self-pay

## 2021-04-21 ENCOUNTER — Ambulatory Visit: Payer: No Typology Code available for payment source | Admitting: Internal Medicine

## 2021-04-21 VITALS — BP 132/72 | HR 63 | Temp 98.7°F | Ht 62.0 in | Wt 172.0 lb

## 2021-04-21 DIAGNOSIS — I10 Essential (primary) hypertension: Secondary | ICD-10-CM | POA: Diagnosis not present

## 2021-04-21 DIAGNOSIS — R7309 Other abnormal glucose: Secondary | ICD-10-CM

## 2021-04-21 DIAGNOSIS — Z01818 Encounter for other preprocedural examination: Secondary | ICD-10-CM

## 2021-04-21 DIAGNOSIS — M25561 Pain in right knee: Secondary | ICD-10-CM | POA: Diagnosis not present

## 2021-04-21 DIAGNOSIS — I2510 Atherosclerotic heart disease of native coronary artery without angina pectoris: Secondary | ICD-10-CM

## 2021-04-21 DIAGNOSIS — M25562 Pain in left knee: Secondary | ICD-10-CM

## 2021-04-21 DIAGNOSIS — Z0000 Encounter for general adult medical examination without abnormal findings: Secondary | ICD-10-CM

## 2021-04-21 DIAGNOSIS — I2583 Coronary atherosclerosis due to lipid rich plaque: Secondary | ICD-10-CM | POA: Diagnosis not present

## 2021-04-21 DIAGNOSIS — G8929 Other chronic pain: Secondary | ICD-10-CM

## 2021-04-21 LAB — COMPREHENSIVE METABOLIC PANEL
ALT: 14 U/L (ref 0–35)
AST: 16 U/L (ref 0–37)
Albumin: 4.1 g/dL (ref 3.5–5.2)
Alkaline Phosphatase: 99 U/L (ref 39–117)
BUN: 12 mg/dL (ref 6–23)
CO2: 30 mEq/L (ref 19–32)
Calcium: 9.5 mg/dL (ref 8.4–10.5)
Chloride: 101 mEq/L (ref 96–112)
Creatinine, Ser: 0.64 mg/dL (ref 0.40–1.20)
GFR: 90.59 mL/min (ref >60.00)
Glucose, Bld: 87 mg/dL (ref 70–99)
Potassium: 3.8 mEq/L (ref 3.5–5.1)
Sodium: 137 mEq/L (ref 135–145)
Total Bilirubin: 0.4 mg/dL (ref 0.2–1.2)
Total Protein: 7.3 g/dL (ref 6.0–8.3)

## 2021-04-21 LAB — LIPID PANEL
Cholesterol: 141 mg/dL (ref 0–200)
HDL: 52.7 mg/dL (ref >39.00)
LDL Cholesterol: 56 mg/dL (ref 0–99)
NonHDL: 88.32
Total CHOL/HDL Ratio: 3
Triglycerides: 162 mg/dL — ABNORMAL HIGH (ref 0.0–149.0)
VLDL: 32.4 mg/dL (ref 0.0–40.0)

## 2021-04-21 LAB — URINALYSIS
Bilirubin Urine: NEGATIVE
Hgb urine dipstick: NEGATIVE
Ketones, ur: NEGATIVE
Leukocytes,Ua: NEGATIVE
Nitrite: NEGATIVE
Specific Gravity, Urine: 1.02 (ref 1.000–1.030)
Total Protein, Urine: NEGATIVE
Urine Glucose: NEGATIVE
Urobilinogen, UA: 0.2 (ref 0.0–1.0)
pH: 6 (ref 5.0–8.0)

## 2021-04-21 LAB — TSH: TSH: 0.86 u[IU]/mL (ref 0.35–5.50)

## 2021-04-21 LAB — HEMOGLOBIN A1C: Hgb A1c MFr Bld: 5.6 % (ref 4.6–6.5)

## 2021-04-21 MED ORDER — AMPHETAMINE-DEXTROAMPHET ER 5 MG PO CP24: 5.0000 mg | ORAL_CAPSULE | Freq: Every day | ORAL | 0 refills | Status: DC

## 2021-04-21 NOTE — Progress Notes (Signed)
Subjective:  Patient ID: Julia Zimmerman, female    DOB: [birth year]  Age: 70 y.o. MRN: [id number]  CC: Pre-op Exam ((R) Hip Replacement)   HPI Julia Zimmerman presents for pre-op exam/ well exam Req Dr Wynelle Link Reason R THR on Dec 28/2022 Hx: HTN, OA-stable   Outpatient Medications Prior to Visit  Medication Sig Dispense Refill   alprazolam (XANAX) 2 MG tablet TAKE 1 TABLET BY MOUTH EVERY DAY AT BEDTIME AS NEEDED FOR INSOMNIA 90 tablet 0   B Complex-C-Folic Acid (B-COMPLEX/FOLIC ACID/VITAMIN C) TBCR TAKE 1 TABLET BY MOUTH EVERY DAY 125 tablet 3   butalbital-acetaminophen-caffeine (FIORICET) 50-325-40 MG tablet TAKE 1 TABLET BY MOUTH 3 TIMES A DAY AS NEEDED. OFFICE VISIT EVERY 6 MONTHS 90 tablet 1   diphenhydrAMINE (BENADRYL) 25 MG tablet Take 25 mg by mouth daily as needed for allergies.     Galcanezumab-gnlm (EMGALITY) 120 MG/ML SOAJ Inject 120 mg into the skin every 30 (thirty) days. 1 mL 11   hydrochlorothiazide (MICROZIDE) 12.5 MG capsule Take 1 capsule (12.5 mg total) by mouth daily. 90 capsule 3   Icosapent Ethyl (VASCEPA) 0.5 g CAPS Take 4 capsules by mouth 2 (two) times daily. 240 capsule 11   losartan-hydrochlorothiazide (HYZAAR) 50-12.5 MG tablet TAKE 1 TABLET BY MOUTH EVERY DAY 90 tablet 3   pantoprazole (PROTONIX) 40 MG tablet TAKE 1 TABLET BY MOUTH EVERY DAY 90 tablet 0   amphetamine-dextroamphetamine (ADDERALL XR) 5 MG 24 hr capsule Take 1 capsule (5 mg total) by mouth daily. 90 capsule 0   venlafaxine XR (EFFEXOR-XR) 150 MG 24 hr capsule TAKE 1 CAPSULE DAILY WITH  BREAKFAST. (Patient taking differently: Take by mouth 3 (three) times a week. Takes 150 MG Monday, Wednesday, and Friday) 90 capsule 3   rosuvastatin (CRESTOR) 5 MG tablet Take 1 tablet (5 mg total) by mouth daily. 90 tablet 3   No facility-administered medications prior to visit.    ROS: Review of Systems  Constitutional:  Negative for activity change, appetite change, chills, fatigue and unexpected  weight change.  HENT:  Negative for congestion, mouth sores and sinus pressure.   Eyes:  Negative for visual disturbance.  Respiratory:  Negative for cough and chest tightness.   Gastrointestinal:  Negative for abdominal pain and nausea.  Genitourinary:  Negative for difficulty urinating, frequency and vaginal pain.  Musculoskeletal:  Positive for arthralgias and gait problem. Negative for back pain.  Skin:  Negative for pallor and rash.  Neurological:  Negative for dizziness, tremors, weakness, numbness and headaches.  Psychiatric/Behavioral:  Negative for confusion and sleep disturbance.    Objective:  BP 132/72 (BP Location: Left Arm)    Pulse 63    Temp 98.7 F (37.1 C) (Oral)    Ht 5\' 2"  (1.575 m)    Wt 172 lb (78 kg)    SpO2 97%    BMI 31.46 kg/m   BP Readings from Last 3 Encounters:  04/21/21 132/72  12/16/20 110/70  10/22/20 132/62    Wt Readings from Last 3 Encounters:  04/21/21 172 lb (78 kg)  12/16/20 174 lb (78.9 kg)  10/22/20 173 lb 9.6 oz (78.7 kg)    Physical Exam Constitutional:      General: She is not in acute distress.    Appearance: She is well-developed.  HENT:     Head: Normocephalic.     Right Ear: External ear normal.     Left Ear: External ear normal.     Nose: Nose normal.  Eyes:     General:        Right eye: No discharge.        Left eye: No discharge.     Conjunctiva/sclera: Conjunctivae normal.     Pupils: Pupils are equal, round, and reactive to light.  Neck:     Thyroid: No thyromegaly.     Vascular: No JVD.     Trachea: No tracheal deviation.  Cardiovascular:     Rate and Rhythm: Normal rate and regular rhythm.     Heart sounds: Normal heart sounds.  Pulmonary:     Effort: No respiratory distress.     Breath sounds: No stridor. No wheezing.  Abdominal:     General: Bowel sounds are normal. There is no distension.     Palpations: Abdomen is soft. There is no mass.     Tenderness: There is no abdominal tenderness. There is no  guarding or rebound.  Musculoskeletal:        General: Tenderness present.     Cervical back: Normal range of motion and neck supple. No rigidity.  Lymphadenopathy:     Cervical: No cervical adenopathy.  Skin:    Findings: No erythema or rash.  Neurological:     Cranial Nerves: No cranial nerve deficit.     Motor: No abnormal muscle tone.     Coordination: Coordination normal.     Deep Tendon Reflexes: Reflexes normal.  Psychiatric:        Behavior: Behavior normal.        Thought Content: Thought content normal.        Judgment: Judgment normal.     Procedure: EKG Indication: pre-op, CAD Impression: NSR. No acute changes. Right hip is stiff and painful with range of motion  Lab Results  Component Value Date   WBC 6.6 04/24/2020   HGB 14.0 04/24/2020   HCT 40.7 04/24/2020   PLT 323.0 04/24/2020   GLUCOSE 87 04/21/2021   CHOL 141 04/21/2021   TRIG 162.0 (H) 04/21/2021   HDL 52.70 04/21/2021   LDLDIRECT 100.0 04/24/2020   LDLCALC 56 04/21/2021   ALT 14 04/21/2021   AST 16 04/21/2021   NA 137 04/21/2021   K 3.8 04/21/2021   CL 101 04/21/2021   CREATININE 0.64 04/21/2021   BUN 12 04/21/2021   CO2 30 04/21/2021   TSH 0.86 04/21/2021   INR 0.97 04/14/2018   HGBA1C 5.6 04/21/2021    DG FLUORO GUIDED NEEDLE PLC ASPIRATION/INJECTION LOC  Result Date: 02/02/2021 CLINICAL DATA:  70 year old female with chronic right hip pain. She presents for injection of local anesthetic and steroid. FLUOROSCOPY TIME:  0 minutes 7 seconds 0.5 mGy PROCEDURE: HIP INJECTION UNDER FLUOROSCOPY After a thorough discussion of risks and benefits of the procedure including bleeding, infection, injuries to adjacent structures and extra-articular injection, written and oral informed consent was obtained. Time out form completed (when appropriate). The patient was placed supine on the fluoroscopy table. Preliminary localization of the right hip was performed. The skin was prepped and draped in the usual  sterile fashion. Local anesthesia was provided with 1% Lidocaine without Epinephrine. Under fluoroscopic guidance, a 20 gauge 3 1/2 inch spinal needle was advanced into the hip joint. Subsequently injection of 3 mL omnipaque 180 contrast agent confirmed intra-articular placement. No vascular uptake. The image was captured showing intra-articular contrast and a solution of 80 mg Depo-Medrol and 2 mL 0.25% Sensorcaine for a volume of 3 ml was injected into the hip. The needles were removed and  a sterile dressing applied. The patient tolerated a procedure well and was discharged. IMPRESSION: Technically successful right hip injection of steroid and anesthetic. Electronically Signed   By: Jacqulynn Cadet M.D.   On: 02/02/2021 14:36    Assessment & Plan:   Problem List Items Addressed This Visit     Coronary atherosclerosis    Statin intolerant Cont on Vascepa      Relevant Orders   EKG 12-Lead (Completed)   Essential hypertension, benign    Con on Losartan HCTZ      Relevant Orders   EKG 12-Lead (Completed)   Knee pain, chronic    R TKR 04/2018      Preoperative clearance     The patient is medically clear for her hip replacement surgery.  Thank you,      Well adult exam    Losartan HCTZ      Other Visit Diagnoses     Preop exam for internal medicine    -  Primary   Relevant Orders   TSH (Completed)   Urinalysis (Completed)   Lipid panel (Completed)   Comprehensive metabolic panel (Completed)   DG Chest 2 View (Completed)   Hemoglobin A1c (Completed)   Elevated glucose       Relevant Orders   Comprehensive metabolic panel (Completed)   Hemoglobin A1c (Completed)         No orders of the defined types were placed in this encounter.     Follow-up: Return in about 3 months (around 07/20/2021).  Walker Kehr, MD

## 2021-04-21 NOTE — Assessment & Plan Note (Signed)
R TKR 04/2018

## 2021-04-21 NOTE — Assessment & Plan Note (Signed)
Losartan HCTZ

## 2021-04-21 NOTE — Assessment & Plan Note (Signed)
Con on Losartan HCTZ

## 2021-04-21 NOTE — Assessment & Plan Note (Signed)
Statin intolerant Cont on Vascepa

## 2021-04-28 ENCOUNTER — Encounter: Payer: No Typology Code available for payment source | Admitting: Internal Medicine

## 2021-05-01 ENCOUNTER — Other Ambulatory Visit: Payer: Self-pay | Admitting: Internal Medicine

## 2021-05-04 NOTE — Assessment & Plan Note (Signed)
The patient is medically clear for her hip replacement surgery.  Thank you,

## 2021-05-07 ENCOUNTER — Other Ambulatory Visit: Payer: Self-pay | Admitting: Internal Medicine

## 2021-05-07 ENCOUNTER — Ambulatory Visit: Payer: No Typology Code available for payment source | Admitting: Internal Medicine

## 2021-05-15 ENCOUNTER — Other Ambulatory Visit: Payer: Self-pay | Admitting: Internal Medicine

## 2021-06-12 ENCOUNTER — Other Ambulatory Visit: Payer: Self-pay | Admitting: Gastroenterology

## 2021-06-17 ENCOUNTER — Other Ambulatory Visit: Payer: Self-pay | Admitting: Internal Medicine

## 2021-06-27 ENCOUNTER — Other Ambulatory Visit: Payer: Self-pay | Admitting: Internal Medicine

## 2021-06-29 ENCOUNTER — Other Ambulatory Visit: Payer: Self-pay | Admitting: Gastroenterology

## 2021-06-29 MED ORDER — PANTOPRAZOLE SODIUM 40 MG PO TBEC: 40.0000 mg | DELAYED_RELEASE_TABLET | Freq: Every day | ORAL | 1 refills | Status: DC

## 2021-06-30 ENCOUNTER — Other Ambulatory Visit: Payer: Self-pay | Admitting: Internal Medicine

## 2021-06-30 ENCOUNTER — Telehealth: Payer: Self-pay

## 2021-06-30 MED ORDER — ALPRAZOLAM 2 MG PO TABS: ORAL_TABLET | ORAL | 0 refills | Status: DC

## 2021-06-30 NOTE — Telephone Encounter (Signed)
Pt is requesting a refill on: alprazolam Julia Zimmerman) 2 MG tablet  Pharmacy: [id number] - [place], [place] - [address] AT [place] 04/21/21 ROV 07/23/21

## 2021-06-30 NOTE — Telephone Encounter (Signed)
Okay.  Thanks.

## 2021-07-03 ENCOUNTER — Ambulatory Visit: Payer: No Typology Code available for payment source | Admitting: Internal Medicine

## 2021-07-08 ENCOUNTER — Telehealth: Payer: Self-pay | Admitting: Internal Medicine

## 2021-07-08 ENCOUNTER — Other Ambulatory Visit: Payer: Self-pay

## 2021-07-08 MED ORDER — BUTALBITAL-APAP-CAFFEINE 50-325-40 MG PO TABS
ORAL_TABLET | ORAL | 1 refills | Status: DC
Start: 2021-07-08 — End: 2021-07-17

## 2021-07-15 ENCOUNTER — Other Ambulatory Visit: Payer: Self-pay | Admitting: Internal Medicine

## 2021-07-17 ENCOUNTER — Other Ambulatory Visit: Payer: Self-pay | Admitting: Internal Medicine

## 2021-07-17 ENCOUNTER — Other Ambulatory Visit: Payer: Self-pay

## 2021-07-17 DIAGNOSIS — R519 Headache, unspecified: Secondary | ICD-10-CM

## 2021-07-17 NOTE — Telephone Encounter (Signed)
Patient calling in ? ?Pharmacy never received refill request that was electronically sent 03/08.[name] please resubmit ? ?Patient says she is now out of her medication & has been having severe headaches all week ? ? ? ? ?

## 2021-07-23 ENCOUNTER — Other Ambulatory Visit: Payer: Self-pay

## 2021-07-23 ENCOUNTER — Encounter: Payer: Self-pay | Admitting: Internal Medicine

## 2021-07-23 ENCOUNTER — Ambulatory Visit: Payer: No Typology Code available for payment source | Admitting: Internal Medicine

## 2021-07-23 ENCOUNTER — Other Ambulatory Visit: Payer: Self-pay | Admitting: Gastroenterology

## 2021-07-23 DIAGNOSIS — I10 Essential (primary) hypertension: Secondary | ICD-10-CM

## 2021-07-23 DIAGNOSIS — F419 Anxiety disorder, unspecified: Secondary | ICD-10-CM | POA: Diagnosis not present

## 2021-07-23 DIAGNOSIS — F988 Other specified behavioral and emotional disorders with onset usually occurring in childhood and adolescence: Secondary | ICD-10-CM | POA: Diagnosis not present

## 2021-07-23 DIAGNOSIS — F32A Depression, unspecified: Secondary | ICD-10-CM | POA: Diagnosis not present

## 2021-07-23 MED ORDER — NURTEC 75 MG PO TBDP: ORAL_TABLET | ORAL | 5 refills | Status: DC

## 2021-07-23 NOTE — Assessment & Plan Note (Addendum)
Stable.  Continue on losartan HCTZ ?

## 2021-07-23 NOTE — Progress Notes (Signed)
? ?Subjective:  ?Patient ID: Julia Zimmerman, female    DOB: Jul 22, 1950  Age: 71 y.o. MRN: [id number] ? ?CC: No chief complaint on file. ? ? ?HPI ?Julia Zimmerman presents for HAs - Emgality is not helping any more (x3-4 months) - daily ?S/p THR ?F/u on ADD, HTN, anxiety ? ?Outpatient Medications Prior to Visit  ?Medication Sig Dispense Refill  ? alprazolam (XANAX) 2 MG tablet TAKE 1 TABLET BY MOUTH EVERY DAY AT BEDTIME AS NEEDED FOR INSOMNIA 90 tablet 0  ? amphetamine-dextroamphetamine (ADDERALL XR) 5 MG 24 hr capsule Take 1 capsule (5 mg total) by mouth daily. 90 capsule 0  ? B Complex-C-Folic Acid (B-COMPLEX/FOLIC ACID/VITAMIN C) TBCR TAKE 1 TABLET BY MOUTH EVERY DAY 125 tablet 3  ? butalbital-acetaminophen-caffeine (FIORICET) 50-325-40 MG tablet TAKE 1 TABLET BY MOUTH 3 TIMES A DAY AS NEEDED. OFFICE VISIT EVERY 6 MONTHS 90 tablet 1  ? diphenhydrAMINE (BENADRYL) 25 MG tablet Take 25 mg by mouth daily as needed for allergies.    ? hydrochlorothiazide (MICROZIDE) 12.5 MG capsule TAKE 1 CAPSULE DAILY 90 capsule 3  ? Icosapent Ethyl (VASCEPA) 0.5 g CAPS Take 4 capsules by mouth 2 (two) times daily. 240 capsule 11  ? losartan-hydrochlorothiazide (HYZAAR) 50-12.5 MG tablet TAKE 1 TABLET BY MOUTH EVERY DAY 90 tablet 3  ? pantoprazole (PROTONIX) 40 MG tablet Take 1 tablet (40 mg total) by mouth daily. Please call to schedule an office visit for further refills. Thank you 30 tablet 1  ? venlafaxine XR (EFFEXOR-XR) 150 MG 24 hr capsule TAKE 1 CAPSULE DAILY WITH  BREAKFAST. 90 capsule 0  ? Galcanezumab-gnlm (EMGALITY) 120 MG/ML SOAJ Inject 120 mg into the skin every 30 (thirty) days. (Patient not taking: Reported on 07/23/2021) 1 mL 11  ? rosuvastatin (CRESTOR) 5 MG tablet Take 1 tablet (5 mg total) by mouth daily. 90 tablet 3  ? ?No facility-administered medications prior to visit.  ? ? ?ROS: ?Review of Systems  ?Constitutional:  Negative for activity change, appetite change, chills, fatigue and unexpected weight change.   ?HENT:  Negative for congestion, mouth sores and sinus pressure.   ?Eyes:  Negative for visual disturbance.  ?Respiratory:  Negative for cough and chest tightness.   ?Gastrointestinal:  Negative for abdominal pain and nausea.  ?Genitourinary:  Negative for difficulty urinating, frequency and vaginal pain.  ?Musculoskeletal:  Positive for arthralgias, back pain and gait problem.  ?Skin:  Negative for pallor and rash.  ?Neurological:  Negative for dizziness, tremors, weakness, numbness and headaches.  ?Psychiatric/Behavioral:  Negative for confusion and sleep disturbance.   ? ?Objective:  ?BP 110/60 (BP Location: Left Arm, Patient Position: Sitting, Cuff Size: Large)   Pulse 89   Temp 99.1 ?F (37.3 ?C) (Oral)   Ht '5\' 2"'$  (1.575 m)   Wt 165 lb (74.8 kg)   SpO2 96%   BMI 30.18 kg/m?  ? ?BP Readings from Last 3 Encounters:  ?07/23/21 110/60  ?04/21/21 132/72  ?12/16/20 110/70  ? ? ?Wt Readings from Last 3 Encounters:  ?07/23/21 165 lb (74.8 kg)  ?04/21/21 172 lb (78 kg)  ?12/16/20 174 lb (78.9 kg)  ? ? ?Physical Exam ?Constitutional:   ?   General: She is not in acute distress. ?   Appearance: She is well-developed. She is obese.  ?HENT:  ?   Head: Normocephalic.  ?   Right Ear: External ear normal.  ?   Left Ear: External ear normal.  ?   Nose: Nose normal.  ?Eyes:  ?  General:     ?   Right eye: No discharge.     ?   Left eye: No discharge.  ?   Conjunctiva/sclera: Conjunctivae normal.  ?   Pupils: Pupils are equal, round, and reactive to light.  ?Neck:  ?   Thyroid: No thyromegaly.  ?   Vascular: No JVD.  ?   Trachea: No tracheal deviation.  ?Cardiovascular:  ?   Rate and Rhythm: Normal rate and regular rhythm.  ?   Heart sounds: Normal heart sounds.  ?Pulmonary:  ?   Effort: No respiratory distress.  ?   Breath sounds: No stridor. No wheezing.  ?Abdominal:  ?   General: Bowel sounds are normal. There is no distension.  ?   Palpations: Abdomen is soft. There is no mass.  ?   Tenderness: There is no abdominal  tenderness. There is no guarding or rebound.  ?Musculoskeletal:     ?   General: Tenderness present.  ?   Cervical back: Normal range of motion and neck supple. No rigidity.  ?Lymphadenopathy:  ?   Cervical: No cervical adenopathy.  ?Skin: ?   Findings: No erythema or rash.  ?Neurological:  ?   Cranial Nerves: No cranial nerve deficit.  ?   Motor: No abnormal muscle tone.  ?   Coordination: Coordination normal.  ?   Deep Tendon Reflexes: Reflexes normal.  ?Psychiatric:     ?   Behavior: Behavior normal.     ?   Thought Content: Thought content normal.     ?   Judgment: Judgment normal.  ? ? ?Lab Results  ?Component Value Date  ? WBC 6.6 04/24/2020  ? HGB 14.0 04/24/2020  ? HCT 40.7 04/24/2020  ? PLT 323.0 04/24/2020  ? GLUCOSE 87 04/21/2021  ? CHOL 141 04/21/2021  ? TRIG 162.0 (H) 04/21/2021  ? HDL 52.70 04/21/2021  ? LDLDIRECT 100.0 04/24/2020  ? [place] 56 04/21/2021  ? ALT 14 04/21/2021  ? AST 16 04/21/2021  ? NA 137 04/21/2021  ? K 3.8 04/21/2021  ? CL 101 04/21/2021  ? CREATININE 0.64 04/21/2021  ? BUN 12 04/21/2021  ? CO2 30 04/21/2021  ? TSH 0.86 04/21/2021  ? INR 0.97 04/14/2018  ? HGBA1C 5.6 04/21/2021  ? ? ?DG FLUORO GUIDED NEEDLE PLC ASPIRATION/INJECTION LOC ? ?Result Date: 02/02/2021 ?CLINICAL DATA:  71 year old female with chronic right hip pain. She presents for injection of local anesthetic and steroid. FLUOROSCOPY TIME:  0 minutes 7 seconds 0.5 mGy PROCEDURE: HIP INJECTION UNDER FLUOROSCOPY After a thorough discussion of risks and benefits of the procedure including bleeding, infection, injuries to adjacent structures and extra-articular injection, written and oral informed consent was obtained. Time out form completed (when appropriate). The patient was placed supine on the fluoroscopy table. Preliminary localization of the right hip was performed. The skin was prepped and draped in the usual sterile fashion. Local anesthesia was provided with 1% Lidocaine without Epinephrine. Under fluoroscopic  guidance, a 20 gauge 3 1/2 inch spinal needle was advanced into the hip joint. Subsequently injection of 3 mL omnipaque 180 contrast agent confirmed intra-articular placement. No vascular uptake. The image was captured showing intra-articular contrast and a solution of 80 mg Depo-Medrol and 2 mL 0.25% Sensorcaine for a volume of 3 ml was injected into the hip. The needles were removed and a sterile dressing applied. The patient tolerated a procedure well and was discharged. IMPRESSION: Technically successful right hip injection of steroid and anesthetic. Electronically Signed  By: Jacqulynn Cadet M.D.   On: 02/02/2021 14:36  ? ? ?Assessment & Plan:  ? ?Problem List Items Addressed This Visit   ? ? Anxiety and depression  ?  Stable.  Continue on Effexor ?Xanax prn ? ? Potential benefits of a long term benzodiazepines  use as well as potential risks  and complications were explained to the patient and were aknowledged. ?  ?  ? ADD (attention deficit disorder)  ?  Chronic.  Continue on Adderall - tolerating a low dose ? Potential benefits of a long term amphetamines  use as well as potential risks  and complications were explained to the patient and were aknowledged. ?  ?  ? Essential hypertension, benign  ?  Stable.  Continue on losartan HCTZ ?  ?  ?  ? ? ?Meds ordered this encounter  ?Medications  ? Rimegepant Sulfate (NURTEC) 75 MG TBDP  ?  Sig: 1 po qod  ?  Dispense:  15 tablet  ?  Refill:  5  ?  ? ? ?Follow-up: Return in about 3 months (around 10/23/2021) for a follow-up visit. ? ?Walker Kehr, MD ?

## 2021-07-26 NOTE — Assessment & Plan Note (Signed)
Chronic.  Continue on Adderall - tolerating a low dose  Potential benefits of a long term amphetamines  use as well as potential risks  and complications were explained to the patient and were aknowledged. 

## 2021-07-26 NOTE — Assessment & Plan Note (Signed)
Stable.  Continue on Effexor ?Xanax prn ? ? Potential benefits of a long term benzodiazepines  use as well as potential risks  and complications were explained to the patient and were aknowledged. ?

## 2021-07-30 ENCOUNTER — Other Ambulatory Visit: Payer: Self-pay | Admitting: Internal Medicine

## 2021-08-11 ENCOUNTER — Other Ambulatory Visit: Payer: Self-pay | Admitting: Internal Medicine

## 2021-08-13 MED ORDER — AMPHETAMINE-DEXTROAMPHET ER 5 MG PO CP24: 5.0000 mg | ORAL_CAPSULE | Freq: Every day | ORAL | 0 refills | Status: DC

## 2021-09-13 ENCOUNTER — Other Ambulatory Visit: Payer: Self-pay | Admitting: Internal Medicine

## 2021-09-13 DIAGNOSIS — R519 Headache, unspecified: Secondary | ICD-10-CM

## 2021-09-15 NOTE — Telephone Encounter (Signed)
Med not on ncir.. MD is out of the office until Friday 09/18/21 pls advise.../l,b ?

## 2021-09-18 ENCOUNTER — Other Ambulatory Visit: Payer: Self-pay | Admitting: Gastroenterology

## 2021-09-21 ENCOUNTER — Other Ambulatory Visit: Payer: Self-pay | Admitting: Internal Medicine

## 2021-09-25 ENCOUNTER — Other Ambulatory Visit: Payer: Self-pay | Admitting: Internal Medicine

## 2021-10-13 ENCOUNTER — Other Ambulatory Visit: Payer: Self-pay | Admitting: Internal Medicine

## 2021-10-13 DIAGNOSIS — R519 Headache, unspecified: Secondary | ICD-10-CM

## 2021-10-26 ENCOUNTER — Ambulatory Visit: Payer: No Typology Code available for payment source | Admitting: Internal Medicine

## 2021-10-28 ENCOUNTER — Other Ambulatory Visit: Payer: Self-pay | Admitting: Internal Medicine

## 2021-10-28 NOTE — Telephone Encounter (Signed)
Ok to PCP please 

## 2021-11-02 ENCOUNTER — Ambulatory Visit: Payer: No Typology Code available for payment source | Admitting: Internal Medicine

## 2021-11-02 ENCOUNTER — Encounter: Payer: Self-pay | Admitting: Internal Medicine

## 2021-11-02 DIAGNOSIS — F32A Depression, unspecified: Secondary | ICD-10-CM

## 2021-11-02 DIAGNOSIS — M25511 Pain in right shoulder: Secondary | ICD-10-CM | POA: Diagnosis not present

## 2021-11-02 DIAGNOSIS — M25512 Pain in left shoulder: Secondary | ICD-10-CM | POA: Diagnosis not present

## 2021-11-02 DIAGNOSIS — F419 Anxiety disorder, unspecified: Secondary | ICD-10-CM

## 2021-11-02 DIAGNOSIS — F988 Other specified behavioral and emotional disorders with onset usually occurring in childhood and adolescence: Secondary | ICD-10-CM | POA: Diagnosis not present

## 2021-11-02 DIAGNOSIS — Z780 Asymptomatic menopausal state: Secondary | ICD-10-CM | POA: Insufficient documentation

## 2021-11-02 DIAGNOSIS — M8580 Other specified disorders of bone density and structure, unspecified site: Secondary | ICD-10-CM | POA: Insufficient documentation

## 2021-11-02 DIAGNOSIS — I10 Essential (primary) hypertension: Secondary | ICD-10-CM | POA: Diagnosis not present

## 2021-11-02 LAB — COMPREHENSIVE METABOLIC PANEL
ALT: 18 U/L (ref 0–35)
AST: 20 U/L (ref 0–37)
Albumin: 4.2 g/dL (ref 3.5–5.2)
Alkaline Phosphatase: 108 U/L (ref 39–117)
BUN: 15 mg/dL (ref 6–23)
CO2: 29 mEq/L (ref 19–32)
Calcium: 9.5 mg/dL (ref 8.4–10.5)
Chloride: 102 mEq/L (ref 96–112)
Creatinine, Ser: 0.74 mg/dL (ref 0.40–1.20)
GFR: 82.63 mL/min (ref >60.00)
Glucose, Bld: 85 mg/dL (ref 70–99)
Potassium: 3.8 mEq/L (ref 3.5–5.1)
Sodium: 140 mEq/L (ref 135–145)
Total Bilirubin: 0.3 mg/dL (ref 0.2–1.2)
Total Protein: 7.3 g/dL (ref 6.0–8.3)

## 2021-11-02 LAB — CBC WITH DIFFERENTIAL/PLATELET
Basophils Absolute: 0.1 10*3/uL (ref 0.0–0.1)
Basophils Relative: 1.3 % (ref 0.0–3.0)
Eosinophils Absolute: 0.1 10*3/uL (ref 0.0–0.7)
Eosinophils Relative: 2.4 % (ref 0.0–5.0)
HCT: 40.5 % (ref 36.0–46.0)
Hemoglobin: 13.5 g/dL (ref 12.0–15.0)
Lymphocytes Relative: 27 % (ref 12.0–46.0)
Lymphs Abs: 1.6 10*3/uL (ref 0.7–4.0)
MCHC: 33.3 g/dL (ref 30.0–36.0)
MCV: 90.1 fl (ref 78.0–100.0)
Monocytes Absolute: 0.6 10*3/uL (ref 0.1–1.0)
Monocytes Relative: 10.9 % (ref 3.0–12.0)
Neutro Abs: 3.5 10*3/uL (ref 1.4–7.7)
Neutrophils Relative %: 58.4 % (ref 43.0–77.0)
Platelets: 297 10*3/uL (ref 150.0–400.0)
RBC: 4.5 Mil/uL (ref 3.87–5.11)
RDW: 13.5 % (ref 11.5–15.5)
WBC: 6 10*3/uL (ref 4.0–10.5)

## 2021-11-02 LAB — URIC ACID: Uric Acid, Serum: 5.8 mg/dL (ref 2.4–7.0)

## 2021-11-02 LAB — SEDIMENTATION RATE: Sed Rate: 11 mm/hr (ref 0–30)

## 2021-11-02 MED ORDER — AMPHETAMINE-DEXTROAMPHET ER 5 MG PO CP24: 5.0000 mg | ORAL_CAPSULE | Freq: Every day | ORAL | 0 refills | Status: DC

## 2021-11-02 MED ORDER — SEGLENTIS 56-44 MG PO TABS: 1.0000 | ORAL_TABLET | Freq: Two times a day (BID) | ORAL | 3 refills | Status: DC | PRN

## 2021-11-02 MED ORDER — VITAMIN D (ERGOCALCIFEROL) 1.25 MG (50000 UNIT) PO CAPS: 50000.0000 [IU] | ORAL_CAPSULE | ORAL | 3 refills | Status: DC

## 2021-11-02 NOTE — Assessment & Plan Note (Signed)
On Losartan HCT

## 2021-11-02 NOTE — Assessment & Plan Note (Addendum)
Worse Start Seglentis if covered Blue-Emu cream was recommended to use 2-3 times a day Check labs for RA, Lyme

## 2021-11-02 NOTE — Assessment & Plan Note (Signed)
Cont on Effexor

## 2021-11-02 NOTE — Progress Notes (Signed)
Subjective:  Patient ID: Julia Zimmerman, female    DOB: December 17, 1950  Age: 71 y.o. MRN: [id number]  CC: No chief complaint on file.   HPI SHAUNTEE KARP presents for anxiety, HTN, OA - worse. C/o stiffness. Has to take 800 mg/d of Ibuprofen to help F/u on ADD  Outpatient Medications Prior to Visit  Medication Sig Dispense Refill   alprazolam (XANAX) 2 MG tablet TAKE 1 TABLET BY MOUTH AT BEDTIME AS NEEDED FOR INSOMNIA 90 tablet 1   B Complex-C-Folic Acid (B-COMPLEX/FOLIC ACID/VITAMIN C) TBCR TAKE 1 TABLET BY MOUTH EVERY DAY 125 tablet 3   butalbital-acetaminophen-caffeine (FIORICET) 50-325-40 MG tablet TAKE 1 TABLET BY MOUTH THREE TIMES A DAY AS NEEDED 90 tablet 1   diphenhydrAMINE (BENADRYL) 25 MG tablet Take 25 mg by mouth daily as needed for allergies.     hydrochlorothiazide (MICROZIDE) 12.5 MG capsule TAKE 1 CAPSULE DAILY 90 capsule 3   losartan-hydrochlorothiazide (HYZAAR) 50-12.5 MG tablet TAKE 1 TABLET BY MOUTH EVERY DAY 90 tablet 2   pantoprazole (PROTONIX) 40 MG tablet Take 1 tablet (40 mg total) by mouth daily. Please call to schedule an office visit for further refills. Thank you 30 tablet 1   Rimegepant Sulfate (NURTEC) 75 MG TBDP 1 po qod 15 tablet 5   venlafaxine XR (EFFEXOR-XR) 150 MG 24 hr capsule TAKE 1 CAPSULE DAILY WITH  BREAKFAST. 90 capsule 1   amphetamine-dextroamphetamine (ADDERALL XR) 5 MG 24 hr capsule Take 1 capsule (5 mg total) by mouth daily. 90 capsule 0   Icosapent Ethyl (VASCEPA) 0.5 g CAPS Take 4 capsules by mouth 2 (two) times daily. 240 capsule 11   rosuvastatin (CRESTOR) 5 MG tablet Take 1 tablet (5 mg total) by mouth daily. 90 tablet 3   Galcanezumab-gnlm (EMGALITY) 120 MG/ML SOAJ Inject 120 mg into the skin every 30 (thirty) days. (Patient not taking: Reported on 07/23/2021) 1 mL 11   No facility-administered medications prior to visit.    ROS: Review of Systems  Constitutional:  Negative for activity change, appetite change, chills, fatigue and  unexpected weight change.  HENT:  Negative for congestion, mouth sores and sinus pressure.   Eyes:  Negative for visual disturbance.  Respiratory:  Negative for cough and chest tightness.   Gastrointestinal:  Negative for abdominal pain and nausea.  Genitourinary:  Negative for difficulty urinating, frequency and vaginal pain.  Musculoskeletal:  Negative for back pain and gait problem.  Skin:  Negative for pallor and rash.  Neurological:  Negative for dizziness, tremors, weakness, numbness and headaches.  Psychiatric/Behavioral:  Negative for confusion and sleep disturbance. The patient is not nervous/anxious.     Objective:  BP 120/76 (BP Location: Left Arm, Patient Position: Sitting, Cuff Size: Normal)   Pulse 81   Temp 98.1 F (36.7 C) (Oral)   Ht '5\' 2"'$  (1.575 m)   Wt 168 lb (76.2 kg)   SpO2 96%   BMI 30.73 kg/m   BP Readings from Last 3 Encounters:  11/02/21 120/76  07/23/21 110/60  04/21/21 132/72    Wt Readings from Last 3 Encounters:  11/02/21 168 lb (76.2 kg)  07/23/21 165 lb (74.8 kg)  04/21/21 172 lb (78 kg)    Physical Exam Constitutional:      General: She is not in acute distress.    Appearance: Normal appearance. She is well-developed.  HENT:     Head: Normocephalic.     Right Ear: External ear normal.     Left Ear: External ear  normal.     Nose: Nose normal.  Eyes:     General:        Right eye: No discharge.        Left eye: No discharge.     Conjunctiva/sclera: Conjunctivae normal.     Pupils: Pupils are equal, round, and reactive to light.  Neck:     Thyroid: No thyromegaly.     Vascular: No JVD.     Trachea: No tracheal deviation.  Cardiovascular:     Rate and Rhythm: Normal rate and regular rhythm.     Heart sounds: Normal heart sounds.  Pulmonary:     Effort: No respiratory distress.     Breath sounds: No stridor. No wheezing.  Abdominal:     General: Bowel sounds are normal. There is no distension.     Palpations: Abdomen is soft.  There is no mass.     Tenderness: There is no abdominal tenderness. There is no guarding or rebound.  Musculoskeletal:        General: No tenderness.     Cervical back: Normal range of motion and neck supple. No rigidity.  Lymphadenopathy:     Cervical: No cervical adenopathy.  Skin:    Findings: No erythema or rash.  Neurological:     Cranial Nerves: No cranial nerve deficit.     Motor: No abnormal muscle tone.     Coordination: Coordination normal.     Deep Tendon Reflexes: Reflexes normal.  Psychiatric:        Behavior: Behavior normal.        Thought Content: Thought content normal.        Judgment: Judgment normal.     Lab Results  Component Value Date   WBC 6.6 04/24/2020   HGB 14.0 04/24/2020   HCT 40.7 04/24/2020   PLT 323.0 04/24/2020   GLUCOSE 87 04/21/2021   CHOL 141 04/21/2021   TRIG 162.0 (H) 04/21/2021   HDL 52.70 04/21/2021   LDLDIRECT 100.0 04/24/2020   LDLCALC 56 04/21/2021   ALT 14 04/21/2021   AST 16 04/21/2021   NA 137 04/21/2021   K 3.8 04/21/2021   CL 101 04/21/2021   CREATININE 0.64 04/21/2021   BUN 12 04/21/2021   CO2 30 04/21/2021   TSH 0.86 04/21/2021   INR 0.97 04/14/2018   HGBA1C 5.6 04/21/2021    DG FLUORO GUIDED NEEDLE PLC ASPIRATION/INJECTION LOC  Result Date: 02/02/2021 CLINICAL DATA:  71 year old female with chronic right hip pain. She presents for injection of local anesthetic and steroid. FLUOROSCOPY TIME:  0 minutes 7 seconds 0.5 mGy PROCEDURE: HIP INJECTION UNDER FLUOROSCOPY After a thorough discussion of risks and benefits of the procedure including bleeding, infection, injuries to adjacent structures and extra-articular injection, written and oral informed consent was obtained. Time out form completed (when appropriate). The patient was placed supine on the fluoroscopy table. Preliminary localization of the right hip was performed. The skin was prepped and draped in the usual sterile fashion. Local anesthesia was provided with 1%  Lidocaine without Epinephrine. Under fluoroscopic guidance, a 20 gauge 3 1/2 inch spinal needle was advanced into the hip joint. Subsequently injection of 3 mL omnipaque 180 contrast agent confirmed intra-articular placement. No vascular uptake. The image was captured showing intra-articular contrast and a solution of 80 mg Depo-Medrol and 2 mL 0.25% Sensorcaine for a volume of 3 ml was injected into the hip. The needles were removed and a sterile dressing applied. The patient tolerated a procedure well and was  discharged. IMPRESSION: Technically successful right hip injection of steroid and anesthetic. Electronically Signed   By: Jacqulynn Cadet M.D.   On: 02/02/2021 14:36    Assessment & Plan:   Problem List Items Addressed This Visit     ADD (attention deficit disorder)    Chronic.  Continue on Adderall - tolerating a low dose  Potential benefits of a long term amphetamines  use as well as potential risks  and complications were explained to the patient and were aknowledged.      Anxiety and depression    Cont on Effexor      Arthralgia    Worse Start Seglentis if covered Blue-Emu cream was recommended to use 2-3 times a day Check labs for RA, Lyme       Relevant Orders   CBC with Differential/Platelet   Comprehensive metabolic panel   Rheumatoid factor   Sedimentation rate   Lyme disease, western blot   Uric acid   Essential hypertension, benign    On Losartan HCT         Meds ordered this encounter  Medications   Celecoxib-traMADol HCl (SEGLENTIS) 56-44 MG TABS    Sig: Take 1-2 tablets by mouth 2 (two) times daily as needed.    Dispense:  120 tablet    Refill:  3   Vitamin D, Ergocalciferol, (DRISDOL) 1.25 MG (50000 UNIT) CAPS capsule    Sig: Take 1 capsule (50,000 Units total) by mouth every 7 (seven) days.    Dispense:  3 capsule    Refill:  3   amphetamine-dextroamphetamine (ADDERALL XR) 5 MG 24 hr capsule    Sig: Take 1 capsule (5 mg total) by mouth daily.     Dispense:  90 capsule    Refill:  0    It is a 3 month prescription      Follow-up: Return in about 3 months (around 02/02/2022) for a follow-up visit.  Walker Kehr, MD

## 2021-11-02 NOTE — Assessment & Plan Note (Signed)
Chronic.  Continue on Adderall - tolerating a low dose  Potential benefits of a long term amphetamines  use as well as potential risks  and complications were explained to the patient and were aknowledged.

## 2021-11-02 NOTE — Telephone Encounter (Signed)
To pcp please 

## 2021-11-03 LAB — RHEUMATOID FACTOR: Rheumatoid fact SerPl-aCnc: <14 IU/mL (ref <14)

## 2021-11-06 LAB — LYME DISEASE, WESTERN BLOT
IgG P18 Ab.: ABSENT
IgG P23 Ab.: ABSENT
IgG P28 Ab.: ABSENT
IgG P30 Ab.: ABSENT
IgG P39 Ab.: ABSENT
IgG P45 Ab.: ABSENT
IgG P66 Ab.: ABSENT
IgM P23 Ab.: ABSENT
IgM P39 Ab.: ABSENT
IgM P41 Ab.: ABSENT
Lyme IgG Wb: NEGATIVE
Lyme IgM Wb: NEGATIVE

## 2021-11-18 ENCOUNTER — Telehealth: Payer: Self-pay | Admitting: Internal Medicine

## 2021-11-18 NOTE — Telephone Encounter (Signed)
Pt received a call from [facility] in regards to her Vitamin D, Ergocalciferol, (DRISDOL) 1.25 MG (50000 UNIT) CAPS capsule. They advised her she needs an rx written for 31 days or more. They advised her they will not fill a quantity less than 31.  Pharmacy:  [facility] [place] [facility] Phone:  [phone]  Fax:  [phone]      Pt also called about Celecoxib-traMADol HCl (SEGLENTIS) 56-44 MG TABS. She stated [facility] advised her they are not able to fill the controlled substance and it must be sent to a local pharmacy. Pt would [facility] rx [facility]  [id number] [place] [place] [address]:  [phone] Fax:  [phone]    Please advise

## 2021-11-19 MED ORDER — VITAMIN D (ERGOCALCIFEROL) 1.25 MG (50000 UNIT) PO CAPS
50000.0000 [IU] | ORAL_CAPSULE | ORAL | 3 refills | Status: DC
Start: 2021-11-19 — End: 2022-03-31

## 2021-11-19 MED ORDER — SEGLENTIS 56-44 MG PO TABS: 1.0000 | ORAL_TABLET | Freq: Two times a day (BID) | ORAL | 3 refills | Status: DC | PRN

## 2021-11-19 NOTE — Telephone Encounter (Signed)
Done. Thx.

## 2021-11-25 ENCOUNTER — Telehealth: Payer: Self-pay | Admitting: *Deleted

## 2021-11-25 NOTE — Telephone Encounter (Signed)
Rec'd fax pt need PA on Seglentis 56-44 mg. Submitted on cover-my-meds w/ (Key: [id number]). Rec'd msg Your information has been submitted to [place]...Johny Chess

## 2021-11-26 NOTE — Telephone Encounter (Signed)
Rec'd determination med was DENIED. It states This request has received a denial. Must try formulary alternative first. Formulary alternative is: celecoxib WITH tramadol (except [place] [phone]). (Requirement: 3 in a class with 3 or more alternatives, 2 in a class with 2 alternatives, or 1 in a class with only 1 alternative. Faxed denial to pof.Marland KitchenJohny Chess

## 2021-11-30 MED ORDER — CELECOXIB 200 MG PO CAPS: 200.0000 mg | ORAL_CAPSULE | Freq: Two times a day (BID) | ORAL | 1 refills | Status: AC

## 2021-11-30 MED ORDER — TRAMADOL HCL 50 MG PO TABS: 25.0000 mg | ORAL_TABLET | Freq: Four times a day (QID) | ORAL | 1 refills | Status: DC | PRN

## 2021-11-30 NOTE — Telephone Encounter (Addendum)
Noted.  Replacement prescriptions emailed.  Thanks

## 2021-11-30 NOTE — Addendum Note (Signed)
Addended by: Cassandria Anger on: 11/30/2021 11:14 PM   Modules accepted: Orders

## 2021-12-10 ENCOUNTER — Other Ambulatory Visit: Payer: Self-pay | Admitting: Internal Medicine

## 2021-12-10 DIAGNOSIS — R519 Headache, unspecified: Secondary | ICD-10-CM

## 2021-12-30 ENCOUNTER — Telehealth: Payer: Self-pay | Admitting: Internal Medicine

## 2021-12-30 NOTE — Telephone Encounter (Signed)
Lily from [facility] [employer] called to let us know that RX for traMADol (ULTRAM) 50 MG tablet requires authorization. Call back number is [phone]

## 2022-01-06 NOTE — Telephone Encounter (Signed)
Submitted PA on cover-my-meds w/ Key# [id number] waiting on status.

## 2022-01-15 NOTE — Telephone Encounter (Signed)
Rec'd determination med was APPROVED. Effective 01/15/22 through 07/16/22. Faxed approval to pof...Julia Zimmerman

## 2022-01-15 NOTE — Telephone Encounter (Signed)
Rec'd call from [employer] checking status on Tramadol. In form rep PA was done 01/06/22 never received coverage. She called cover-my-meds verify Key which the and states they never received. Started a new PA w/ [id number] Rec'd msg Your PA request has been sent to [employer].Marland KitchenJohny Chess

## 2022-01-19 ENCOUNTER — Other Ambulatory Visit: Payer: Self-pay | Admitting: Internal Medicine

## 2022-01-20 ENCOUNTER — Other Ambulatory Visit: Payer: Self-pay | Admitting: Internal Medicine

## 2022-02-01 ENCOUNTER — Other Ambulatory Visit: Payer: Self-pay | Admitting: Internal Medicine

## 2022-02-04 ENCOUNTER — Other Ambulatory Visit: Payer: Self-pay | Admitting: Internal Medicine

## 2022-02-04 DIAGNOSIS — R519 Headache, unspecified: Secondary | ICD-10-CM

## 2022-02-04 NOTE — Telephone Encounter (Signed)
MD out of the office until Monday 02/08/22 pls advise on refill.Marland KitchenJohny Chess

## 2022-03-22 ENCOUNTER — Other Ambulatory Visit: Payer: Self-pay | Admitting: Internal Medicine

## 2022-03-24 ENCOUNTER — Other Ambulatory Visit: Payer: Self-pay | Admitting: Internal Medicine

## 2022-04-01 ENCOUNTER — Other Ambulatory Visit: Payer: Self-pay | Admitting: Internal Medicine

## 2022-04-01 DIAGNOSIS — R519 Headache, unspecified: Secondary | ICD-10-CM

## 2022-04-06 NOTE — Telephone Encounter (Signed)
Patient needs this med refilled - she has a follow up on 04/22/2022

## 2022-04-22 ENCOUNTER — Other Ambulatory Visit (INDEPENDENT_AMBULATORY_CARE_PROVIDER_SITE_OTHER): Payer: No Typology Code available for payment source

## 2022-04-22 ENCOUNTER — Ambulatory Visit: Payer: No Typology Code available for payment source

## 2022-04-22 ENCOUNTER — Ambulatory Visit (INDEPENDENT_AMBULATORY_CARE_PROVIDER_SITE_OTHER): Payer: No Typology Code available for payment source | Admitting: Internal Medicine

## 2022-04-22 ENCOUNTER — Other Ambulatory Visit: Payer: Self-pay | Admitting: Internal Medicine

## 2022-04-22 ENCOUNTER — Encounter: Payer: Self-pay | Admitting: Internal Medicine

## 2022-04-22 VITALS — BP 122/80 | HR 100 | Temp 97.8°F | Ht 62.0 in | Wt 169.0 lb

## 2022-04-22 DIAGNOSIS — E785 Hyperlipidemia, unspecified: Secondary | ICD-10-CM

## 2022-04-22 DIAGNOSIS — R9389 Abnormal findings on diagnostic imaging of other specified body structures: Secondary | ICD-10-CM | POA: Diagnosis not present

## 2022-04-22 DIAGNOSIS — R748 Abnormal levels of other serum enzymes: Secondary | ICD-10-CM

## 2022-04-22 DIAGNOSIS — F988 Other specified behavioral and emotional disorders with onset usually occurring in childhood and adolescence: Secondary | ICD-10-CM

## 2022-04-22 DIAGNOSIS — I2510 Atherosclerotic heart disease of native coronary artery without angina pectoris: Secondary | ICD-10-CM

## 2022-04-22 DIAGNOSIS — I2583 Coronary atherosclerosis due to lipid rich plaque: Secondary | ICD-10-CM | POA: Diagnosis not present

## 2022-04-22 MED ORDER — AMPHETAMINE-DEXTROAMPHET ER 5 MG PO CP24: 5.0000 mg | ORAL_CAPSULE | Freq: Every day | ORAL | 0 refills | Status: DC

## 2022-04-22 MED ORDER — PRAVASTATIN SODIUM 10 MG PO TABS: 10.0000 mg | ORAL_TABLET | Freq: Every day | ORAL | 3 refills | Status: DC

## 2022-04-22 MED ORDER — PANTOPRAZOLE SODIUM 40 MG PO TBEC: 40.0000 mg | DELAYED_RELEASE_TABLET | Freq: Every day | ORAL | 3 refills | Status: DC

## 2022-04-22 NOTE — Assessment & Plan Note (Addendum)
On Crestor - cramps; had to stop Started on pravastatin if tolerated

## 2022-04-22 NOTE — Assessment & Plan Note (Signed)
Potential benefits of a long term amphetamines  use as well as potential risks  and complications were explained to the patient and were aknowledged.  Chronic.  Continue on Adderall - tolerating a low dose  Potential benefits of a long term amphetamines  use as well as potential risks  and complications were explained to the patient and were aknowledged.

## 2022-04-22 NOTE — Assessment & Plan Note (Signed)
Change to Pravachol

## 2022-04-22 NOTE — Progress Notes (Signed)
Subjective:  Patient ID: Julia Zimmerman, female    DOB: [birth year]  Age: 71 y.o. MRN: [id number]  CC: Annual Exam   HPI IMANE BURROUGH presents for GERD, ADD, HAs  Outpatient Medications Prior to Visit  Medication Sig Dispense Refill   alprazolam (XANAX) 2 MG tablet TAKE 1 TABLET BY MOUTH EVERY DAY AT BEDTIME AS NEEDED FOR INSOMNIA 90 tablet 1   B Complex-C-Folic Acid (B-COMPLEX/FOLIC ACID/VITAMIN C) TBCR TAKE 1 TABLET BY MOUTH EVERY DAY 125 tablet 3   butalbital-acetaminophen-caffeine (FIORICET) 50-325-40 MG tablet TAKE 1 TABLET BY MOUTH THREE TIMES A DAY AS NEEDED 90 tablet 1   celecoxib (CELEBREX) 200 MG capsule Take 1 capsule (200 mg total) by mouth 2 (two) times daily. 180 capsule 1   diphenhydrAMINE (BENADRYL) 25 MG tablet Take 25 mg by mouth daily as needed for allergies.     hydrochlorothiazide (MICROZIDE) 12.5 MG capsule TAKE 1 CAPSULE DAILY 90 capsule 3   losartan-hydrochlorothiazide (HYZAAR) 50-12.5 MG tablet TAKE 1 TABLET BY MOUTH EVERY DAY 90 tablet 2   Rimegepant Sulfate (NURTEC) 75 MG TBDP TAKE 1 TABLET BY MOUTH EVERY OTHER DAY 15 tablet 5   traMADol (ULTRAM) 50 MG tablet TAKE 1/2-1 TABLET BY MOUTH EVERY 6 (SIX) HOURS AS NEEDED FOR SEVERE PAIN. **P.A. DENIED* 120 tablet 1   Vitamin D, Ergocalciferol, (DRISDOL) 1.25 MG (50000 UNIT) CAPS capsule TAKE 1 CAPSULE (50,000 UNITS TOTAL) BY MOUTH EVERY 7 (SEVEN) DAYS 3 capsule 3   amphetamine-dextroamphetamine (ADDERALL XR) 5 MG 24 hr capsule Take 1 capsule (5 mg total) by mouth daily. 90 capsule 0   pantoprazole (PROTONIX) 40 MG tablet Take 1 tablet (40 mg total) by mouth daily. Please call to schedule an office visit for further refills. Thank you 30 tablet 1   venlafaxine XR (EFFEXOR-XR) 150 MG 24 hr capsule TAKE 1 CAPSULE DAILY WITH  BREAKFAST. 90 capsule 1   rosuvastatin (CRESTOR) 5 MG tablet TAKE 1 TABLET (5 MG TOTAL) BY MOUTH DAILY. 90 tablet 3   No facility-administered medications prior to visit.    ROS: Review of  Systems  Objective:  BP 122/80 (BP Location: Left Arm, Patient Position: Sitting, Cuff Size: Normal)   Pulse 100   Temp 97.8 F (36.6 C) (Oral)   Ht '5\' 2"'$  (1.575 m)   Wt 169 lb (76.7 kg)   SpO2 97%   BMI 30.91 kg/m   BP Readings from Last 3 Encounters:  04/22/22 122/80  11/02/21 120/76  07/23/21 110/60    Wt Readings from Last 3 Encounters:  04/22/22 169 lb (76.7 kg)  11/02/21 168 lb (76.2 kg)  07/23/21 165 lb (74.8 kg)    Physical Exam  Lab Results  Component Value Date   WBC 6.0 11/02/2021   HGB 13.5 11/02/2021   HCT 40.5 11/02/2021   PLT 297.0 11/02/2021   GLUCOSE 93 04/22/2022   CHOL 258 (H) 04/22/2022   TRIG 321.0 (H) 04/22/2022   HDL 49.10 04/22/2022   LDLDIRECT 120.0 04/22/2022   LDLCALC 56 04/21/2021   ALT 17 04/22/2022   AST 19 04/22/2022   NA 138 04/22/2022   K 3.7 04/22/2022   CL 99 04/22/2022   CREATININE 0.70 04/22/2022   BUN 11 04/22/2022   CO2 27 04/22/2022   TSH 1.47 04/22/2022   INR 0.97 04/14/2018   HGBA1C 5.6 04/21/2021    DG FLUORO GUIDED NEEDLE PLC ASPIRATION/INJECTION LOC  Result Date: 02/02/2021 CLINICAL DATA:  71 year old female with chronic right hip pain. She presents  for injection of local anesthetic and steroid. FLUOROSCOPY TIME:  0 minutes 7 seconds 0.5 mGy PROCEDURE: HIP INJECTION UNDER FLUOROSCOPY After a thorough discussion of risks and benefits of the procedure including bleeding, infection, injuries to adjacent structures and extra-articular injection, written and oral informed consent was obtained. Time out form completed (when appropriate). The patient was placed supine on the fluoroscopy table. Preliminary localization of the right hip was performed. The skin was prepped and draped in the usual sterile fashion. Local anesthesia was provided with 1% Lidocaine without Epinephrine. Under fluoroscopic guidance, a 20 gauge 3 1/2 inch spinal needle was advanced into the hip joint. Subsequently injection of 3 mL omnipaque 180  contrast agent confirmed intra-articular placement. No vascular uptake. The image was captured showing intra-articular contrast and a solution of 80 mg Depo-Medrol and 2 mL 0.25% Sensorcaine for a volume of 3 ml was injected into the hip. The needles were removed and a sterile dressing applied. The patient tolerated a procedure well and was discharged. IMPRESSION: Technically successful right hip injection of steroid and anesthetic. Electronically Signed   By: Jacqulynn Cadet M.D.   On: 02/02/2021 14:36    Assessment & Plan:   Problem List Items Addressed This Visit     Hyperlipidemia    On Crestor - cramps; had to stop Started on pravastatin if tolerated      Relevant Medications   pravastatin (PRAVACHOL) 10 MG tablet   Other Relevant Orders   Comprehensive metabolic panel   Lipid panel (Completed)   TSH (Completed)   Comprehensive metabolic panel (Completed)   Elevated alkaline phosphatase level    Likely post cholecystectomy.  Will check right upper quadrant ultrasound      Coronary atherosclerosis - Primary    Change to Pravachol      Relevant Medications   pravastatin (PRAVACHOL) 10 MG tablet   Other Relevant Orders   Comprehensive metabolic panel   Lipid panel (Completed)   TSH (Completed)   Comprehensive metabolic panel (Completed)   ADD (attention deficit disorder)    Potential benefits of a long term amphetamines  use as well as potential risks  and complications were explained to the patient and were aknowledged.  Chronic.  Continue on Adderall - tolerating a low dose  Potential benefits of a long term amphetamines  use as well as potential risks  and complications were explained to the patient and were aknowledged.      Abnormal chest CT    Stable R lung scar tissue CXR       Relevant Orders   DG Chest 2 View (Completed)      Meds ordered this encounter  Medications   amphetamine-dextroamphetamine (ADDERALL XR) 5 MG 24 hr capsule    Sig: Take 1  capsule (5 mg total) by mouth daily.    Dispense:  90 capsule    Refill:  0    It is a 3 month prescription   pantoprazole (PROTONIX) 40 MG tablet    Sig: Take 1 tablet (40 mg total) by mouth daily. Please call to schedule an office visit for further refills. Thank you    Dispense:  90 tablet    Refill:  3    Please advise pt to schedule an OV for any further refills. Last seen 12-2019.Thx   pravastatin (PRAVACHOL) 10 MG tablet    Sig: Take 1 tablet (10 mg total) by mouth daily.    Dispense:  90 tablet    Refill:  3  Follow-up: Return in about 6 months (around 10/22/2022) for a follow-up visit.  Walker Kehr, MD

## 2022-04-22 NOTE — Assessment & Plan Note (Signed)
Stable R lung scar tissue CXR

## 2022-04-22 NOTE — Assessment & Plan Note (Signed)
Chronic   Potential benefits of a long term Belviq use as well as potential risks  and complications were explained to the patient and were aknowledged.

## 2022-04-23 ENCOUNTER — Ambulatory Visit (INDEPENDENT_AMBULATORY_CARE_PROVIDER_SITE_OTHER)
Admission: RE | Admit: 2022-04-23 | Discharge: 2022-04-23 | Disposition: A | Discharge status: Home / Self Care | Payer: No Typology Code available for payment source | Source: Ambulatory Visit | Attending: Internal Medicine | Admitting: Internal Medicine

## 2022-04-23 DIAGNOSIS — R9389 Abnormal findings on diagnostic imaging of other specified body structures: Secondary | ICD-10-CM | POA: Diagnosis not present

## 2022-04-23 LAB — LDL CHOLESTEROL, DIRECT: Direct LDL: 120 mg/dL

## 2022-04-23 LAB — LIPID PANEL
Cholesterol: 258 mg/dL — ABNORMAL HIGH (ref 0–200)
HDL: 49.1 mg/dL (ref >39.00)
NonHDL: 209.07
Total CHOL/HDL Ratio: 5
Triglycerides: 321 mg/dL — ABNORMAL HIGH (ref 0.0–149.0)
VLDL: 64.2 mg/dL — ABNORMAL HIGH (ref 0.0–40.0)

## 2022-04-23 LAB — COMPREHENSIVE METABOLIC PANEL
ALT: 17 U/L (ref 0–35)
AST: 19 U/L (ref 0–37)
Albumin: 4.3 g/dL (ref 3.5–5.2)
Alkaline Phosphatase: 126 U/L — ABNORMAL HIGH (ref 39–117)
BUN: 11 mg/dL (ref 6–23)
CO2: 27 mEq/L (ref 19–32)
Calcium: 9.6 mg/dL (ref 8.4–10.5)
Chloride: 99 mEq/L (ref 96–112)
Creatinine, Ser: 0.7 mg/dL (ref 0.40–1.20)
GFR: 88.03 mL/min (ref >60.00)
Glucose, Bld: 93 mg/dL (ref 70–99)
Potassium: 3.7 mEq/L (ref 3.5–5.1)
Sodium: 138 mEq/L (ref 135–145)
Total Bilirubin: 0.3 mg/dL (ref 0.2–1.2)
Total Protein: 7.6 g/dL (ref 6.0–8.3)

## 2022-04-23 LAB — TSH: TSH: 1.47 u[IU]/mL (ref 0.35–5.50)

## 2022-04-27 ENCOUNTER — Other Ambulatory Visit: Payer: Self-pay | Admitting: Internal Medicine

## 2022-04-27 DIAGNOSIS — R748 Abnormal levels of other serum enzymes: Secondary | ICD-10-CM

## 2022-04-27 NOTE — Assessment & Plan Note (Signed)
Likely post cholecystectomy.  Will check right upper quadrant ultrasound

## 2022-04-30 ENCOUNTER — Other Ambulatory Visit: Payer: Self-pay | Admitting: Internal Medicine

## 2022-04-30 ENCOUNTER — Ambulatory Visit
Admission: RE | Admit: 2022-04-30 | Discharge: 2022-04-30 | Disposition: A | Discharge status: Home / Self Care | Payer: No Typology Code available for payment source | Source: Ambulatory Visit | Attending: Internal Medicine | Admitting: Internal Medicine

## 2022-04-30 DIAGNOSIS — R748 Abnormal levels of other serum enzymes: Secondary | ICD-10-CM

## 2022-04-30 DIAGNOSIS — K838 Other specified diseases of biliary tract: Secondary | ICD-10-CM | POA: Insufficient documentation

## 2022-05-03 ENCOUNTER — Other Ambulatory Visit: Payer: Self-pay | Admitting: Internal Medicine

## 2022-05-14 ENCOUNTER — Other Ambulatory Visit (INDEPENDENT_AMBULATORY_CARE_PROVIDER_SITE_OTHER): Payer: Self-pay

## 2022-05-14 DIAGNOSIS — R748 Abnormal levels of other serum enzymes: Secondary | ICD-10-CM

## 2022-05-14 LAB — COMPREHENSIVE METABOLIC PANEL
ALT: 12 U/L (ref 0–35)
AST: 14 U/L (ref 0–37)
Albumin: 4 g/dL (ref 3.5–5.2)
Alkaline Phosphatase: 117 U/L (ref 39–117)
BUN: 16 mg/dL (ref 6–23)
CO2: 29 mEq/L (ref 19–32)
Calcium: 9.2 mg/dL (ref 8.4–10.5)
Chloride: 102 mEq/L (ref 96–112)
Creatinine, Ser: 0.74 mg/dL (ref 0.40–1.20)
GFR: 82.32 mL/min (ref >60.00)
Glucose, Bld: 96 mg/dL (ref 70–99)
Potassium: 4.3 mEq/L (ref 3.5–5.1)
Sodium: 141 mEq/L (ref 135–145)
Total Bilirubin: 0.3 mg/dL (ref 0.2–1.2)
Total Protein: 7.2 g/dL (ref 6.0–8.3)

## 2022-05-17 ENCOUNTER — Ambulatory Visit (INDEPENDENT_AMBULATORY_CARE_PROVIDER_SITE_OTHER): Payer: Medicare Other | Admitting: Internal Medicine

## 2022-05-17 ENCOUNTER — Encounter: Payer: Self-pay | Admitting: Internal Medicine

## 2022-05-17 VITALS — BP 112/70 | HR 70 | Temp 98.3°F | Ht 62.0 in | Wt 172.0 lb

## 2022-05-17 DIAGNOSIS — R1310 Dysphagia, unspecified: Secondary | ICD-10-CM | POA: Diagnosis not present

## 2022-05-17 DIAGNOSIS — K838 Other specified diseases of biliary tract: Secondary | ICD-10-CM

## 2022-05-17 DIAGNOSIS — K219 Gastro-esophageal reflux disease without esophagitis: Secondary | ICD-10-CM

## 2022-05-17 DIAGNOSIS — F419 Anxiety disorder, unspecified: Secondary | ICD-10-CM | POA: Diagnosis not present

## 2022-05-17 DIAGNOSIS — F32A Depression, unspecified: Secondary | ICD-10-CM

## 2022-05-17 MED ORDER — RABEPRAZOLE SODIUM 20 MG PO TBEC: 20.0000 mg | DELAYED_RELEASE_TABLET | Freq: Every day | ORAL | 11 refills | Status: AC

## 2022-05-17 MED ORDER — SUCRALFATE 1 GM/10ML PO SUSP: 1.0000 g | Freq: Three times a day (TID) | ORAL | 3 refills | Status: AC

## 2022-05-17 MED ORDER — FAMOTIDINE 40 MG PO TABS: 40.0000 mg | ORAL_TABLET | Freq: Every day | ORAL | 3 refills | Status: DC

## 2022-05-17 NOTE — Assessment & Plan Note (Signed)
Worse ?stricture Change to Aciphex PO pepcid Start Carafate GI ref - Dr Fuller Plan

## 2022-05-17 NOTE — Assessment & Plan Note (Addendum)
GI ref to see Dr Fuller Plan [place] 11.5 mm ?etiology S/p chole

## 2022-05-17 NOTE — Assessment & Plan Note (Signed)
Xnax prn - very little

## 2022-05-17 NOTE — Progress Notes (Signed)
Subjective:  Patient ID: Julia Zimmerman, female    DOB: 09/09/1950  Age: 72 y.o. MRN: [id number]  CC: No chief complaint on file.   HPI Julia Zimmerman presents for severe GERD - has to sleep sitting up... C/o feeling full, pain, dysphagia - worse x 9-10 d  Outpatient Medications Prior to Visit  Medication Sig Dispense Refill   alprazolam (XANAX) 2 MG tablet TAKE 1 TABLET BY MOUTH EVERY DAY AT BEDTIME AS NEEDED FOR INSOMNIA 90 tablet 1   amphetamine-dextroamphetamine (ADDERALL XR) 5 MG 24 hr capsule Take 1 capsule (5 mg total) by mouth daily. 90 capsule 0   B Complex-C-Folic Acid (B-COMPLEX/FOLIC ACID/VITAMIN C) TBCR TAKE 1 TABLET BY MOUTH EVERY DAY 125 tablet 3   butalbital-acetaminophen-caffeine (FIORICET) 50-325-40 MG tablet TAKE 1 TABLET BY MOUTH THREE TIMES A DAY AS NEEDED 90 tablet 1   celecoxib (CELEBREX) 200 MG capsule Take 1 capsule (200 mg total) by mouth 2 (two) times daily. 180 capsule 1   diphenhydrAMINE (BENADRYL) 25 MG tablet Take 25 mg by mouth daily as needed for allergies.     hydrochlorothiazide (MICROZIDE) 12.5 MG capsule TAKE 1 CAPSULE DAILY 90 capsule 3   losartan-hydrochlorothiazide (HYZAAR) 50-12.5 MG tablet TAKE 1 TABLET BY MOUTH EVERY DAY 90 tablet 2   pravastatin (PRAVACHOL) 10 MG tablet Take 1 tablet (10 mg total) by mouth daily. 90 tablet 3   Rimegepant Sulfate (NURTEC) 75 MG TBDP TAKE 1 TABLET BY MOUTH EVERY OTHER DAY 15 tablet 5   traMADol (ULTRAM) 50 MG tablet TAKE 1/2-1 TABLET BY MOUTH EVERY 6 (SIX) HOURS AS NEEDED FOR SEVERE PAIN. **P.A. DENIED* 120 tablet 1   Vitamin D, Ergocalciferol, (DRISDOL) 1.25 MG (50000 UNIT) CAPS capsule TAKE 1 CAPSULE (50,000 UNITS TOTAL) BY MOUTH EVERY 7 (SEVEN) DAYS 3 capsule 3   pantoprazole (PROTONIX) 40 MG tablet Take 1 tablet (40 mg total) by mouth daily. Please call to schedule an office visit for further refills. Thank you 90 tablet 3   venlafaxine XR (EFFEXOR-XR) 150 MG 24 hr capsule TAKE 1 CAPSULE DAILY WITH   BREAKFAST. 90 capsule 1   No facility-administered medications prior to visit.    ROS: Review of Systems  Constitutional:  Negative for activity change, appetite change, chills, fatigue and unexpected weight change.  HENT:  Negative for congestion, mouth sores and sinus pressure.   Eyes:  Negative for visual disturbance.  Respiratory:  Negative for cough and chest tightness.   Gastrointestinal:  Positive for abdominal distention and nausea. Negative for abdominal pain and vomiting.  Genitourinary:  Negative for difficulty urinating, frequency and vaginal pain.  Musculoskeletal:  Negative for back pain and gait problem.  Skin:  Negative for pallor and rash.  Neurological:  Negative for dizziness, tremors, weakness, numbness and headaches.  Psychiatric/Behavioral:  Negative for confusion and sleep disturbance.     Objective:  BP 112/70 (BP Location: Right Arm, Patient Position: Sitting, Cuff Size: Large)   Pulse 70   Temp 98.3 F (36.8 C) (Oral)   Ht '5\' 2"'$  (1.575 m)   Wt 172 lb (78 kg)   SpO2 97%   BMI 31.46 kg/m   BP Readings from Last 3 Encounters:  05/17/22 112/70  04/22/22 122/80  11/02/21 120/76    Wt Readings from Last 3 Encounters:  05/17/22 172 lb (78 kg)  04/22/22 169 lb (76.7 kg)  11/02/21 168 lb (76.2 kg)    Physical Exam Constitutional:      General: She is not in  acute distress.    Appearance: She is well-developed. She is obese.  HENT:     Head: Normocephalic.     Right Ear: External ear normal.     Left Ear: External ear normal.     Nose: Nose normal.  Eyes:     General:        Right eye: No discharge.        Left eye: No discharge.     Conjunctiva/sclera: Conjunctivae normal.     Pupils: Pupils are equal, round, and reactive to light.  Neck:     Thyroid: No thyromegaly.     Vascular: No JVD.     Trachea: No tracheal deviation.  Cardiovascular:     Rate and Rhythm: Normal rate and regular rhythm.     Heart sounds: Normal heart sounds.   Pulmonary:     Effort: No respiratory distress.     Breath sounds: No stridor. No wheezing.  Abdominal:     General: Bowel sounds are normal. There is no distension.     Palpations: Abdomen is soft. There is no mass.     Tenderness: There is no abdominal tenderness. There is no guarding or rebound.  Musculoskeletal:        General: No tenderness.     Cervical back: Normal range of motion and neck supple. No rigidity.  Lymphadenopathy:     Cervical: No cervical adenopathy.  Skin:    Findings: No erythema or rash.  Neurological:     Cranial Nerves: No cranial nerve deficit.     Motor: No abnormal muscle tone.     Coordination: Coordination normal.     Deep Tendon Reflexes: Reflexes normal.  Psychiatric:        Behavior: Behavior normal.        Thought Content: Thought content normal.        Judgment: Judgment normal.   So rabeprazole will be placed on thank morning this one at night and this is Carafate it liquid stop should call to your esophagus and your stomach so hopefully you will be able to sleep better okay and you know I said take it for 30 to take it you that time I will hold   #    Active problems pain burning Chronic  Lab Results  Component Value Date   WBC 6.0 11/02/2021   HGB 13.5 11/02/2021   HCT 40.5 11/02/2021   PLT 297.0 11/02/2021   GLUCOSE 96 05/14/2022   CHOL 258 (H) 04/22/2022   TRIG 321.0 (H) 04/22/2022   HDL 49.10 04/22/2022   LDLDIRECT 120.0 04/22/2022   LDLCALC 56 04/21/2021   ALT 12 05/14/2022   AST 14 05/14/2022   NA 141 05/14/2022   K 4.3 05/14/2022   CL 102 05/14/2022   CREATININE 0.74 05/14/2022   BUN 16 05/14/2022   CO2 29 05/14/2022   TSH 1.47 04/22/2022   INR 0.97 04/14/2018   HGBA1C 5.6 04/21/2021    US Abdomen Limited RUQ (LIVER/GB)  Result Date: 04/30/2022 CLINICAL DATA:  Elevated alkaline phosphatase EXAM: ULTRASOUND ABDOMEN LIMITED RIGHT UPPER QUADRANT COMPARISON:  CT chest April 28, 2020 FINDINGS: Gallbladder:  Surgically absent Common bile duct: Diameter: 11.5 mm distally Liver: Increased hepatic parenchymal echogenicity. No focal lesion. Portal vein is patent on color Doppler imaging with normal direction of blood flow towards the liver. Other: None. IMPRESSION: 1. Common bile duct is dilated measuring up to 11.5 mm. Recommend correlation with LFTs. If abnormal, further evaluation with MRCP or ERCP  may be considered. 2. Increased hepatic parenchymal echogenicity suggestive of steatosis. Electronically Signed   By: Lovey Newcomer M.D.   On: 04/30/2022 10:34    Assessment & Plan:   Problem List Items Addressed This Visit       Digestive   GERD    Worse ?stricture Change to Aciphex PO pepcid Start Carafate GI ref - Dr Fuller Plan      Relevant Medications   RABEprazole (ACIPHEX) 20 MG tablet   famotidine (PEPCID) 40 MG tablet   sucralfate (CARAFATE) 1 GM/10ML suspension   Other Relevant Orders   Ambulatory referral to Gastroenterology   Dysphagia - Primary    Worse ?stricture Change to Aciphex PO pepcid Start Carafate GI ref - Dr Fuller Plan      Relevant Orders   Ambulatory referral to Gastroenterology   Dilated cbd, acquired    GI ref to see Dr Fuller Plan [place] 11.5 mm ?etiology S/p chole      Relevant Orders   Ambulatory referral to Gastroenterology     Other   Anxiety and depression    Xnax prn - very little         Meds ordered this encounter  Medications   RABEprazole (ACIPHEX) 20 MG tablet    Sig: Take 1 tablet (20 mg total) by mouth daily.    Dispense:  30 tablet    Refill:  11   famotidine (PEPCID) 40 MG tablet    Sig: Take 1 tablet (40 mg total) by mouth at bedtime.    Dispense:  90 tablet    Refill:  3   sucralfate (CARAFATE) 1 GM/10ML suspension    Sig: Take 10 mLs (1 g total) by mouth 4 (four) times daily -  with meals and at bedtime.    Dispense:  420 mL    Refill:  3      Follow-up: Return in about 3 months (around 08/16/2022) for a follow-up visit.  Walker Kehr, MD

## 2022-06-07 ENCOUNTER — Telehealth: Payer: Self-pay | Admitting: Internal Medicine

## 2022-06-07 DIAGNOSIS — R519 Headache, unspecified: Secondary | ICD-10-CM

## 2022-06-07 MED ORDER — BUTALBITAL-APAP-CAFFEINE 50-325-40 MG PO TABS: 1.0000 | ORAL_TABLET | Freq: Three times a day (TID) | ORAL | 1 refills | Status: DC | PRN

## 2022-06-07 NOTE — Telephone Encounter (Signed)
Okay.  Thanks.

## 2022-06-07 NOTE — Telephone Encounter (Signed)
Caller & Relationship to patient: PT  Call back number: [phone]  Date of last office visit: 05/17/2022  Date of next office visit: N/A  Medication(s) to be refilled:  butalbital-acetaminophen-caffeine (FIORICET) 50-325-40 MG tablet   Preferred Pharmacy:  [facility], [address] AT [place]   PT noted she tried to do this RX request through [employer] but due to her changing pharmacies I don't think it went through.

## 2022-06-08 NOTE — Telephone Encounter (Signed)
Notified pt w/MD response.../lmb 

## 2022-06-10 ENCOUNTER — Ambulatory Visit: Payer: Medicare Other | Admitting: Gastroenterology

## 2022-06-10 ENCOUNTER — Telehealth: Payer: Self-pay | Admitting: Gastroenterology

## 2022-06-10 NOTE — Telephone Encounter (Signed)
Good morning Dr. Fuller Plan,   Patient called stating she needed to reschedule her appointment with you for this afternoon at 3:00 due to waking up not feeling well and having a high fever.   Patient was rescheduled for 2/21 at 10:10

## 2022-06-20 ENCOUNTER — Other Ambulatory Visit: Payer: Self-pay | Admitting: Internal Medicine

## 2022-06-23 ENCOUNTER — Ambulatory Visit: Payer: Medicare Other | Admitting: Gastroenterology

## 2022-06-23 ENCOUNTER — Encounter: Payer: Self-pay | Admitting: Gastroenterology

## 2022-06-23 VITALS — BP 110/70 | HR 54 | Ht 62.0 in | Wt 171.0 lb

## 2022-06-23 DIAGNOSIS — R6881 Early satiety: Secondary | ICD-10-CM

## 2022-06-23 DIAGNOSIS — K838 Other specified diseases of biliary tract: Secondary | ICD-10-CM

## 2022-06-23 DIAGNOSIS — R1013 Epigastric pain: Secondary | ICD-10-CM

## 2022-06-23 DIAGNOSIS — R1310 Dysphagia, unspecified: Secondary | ICD-10-CM | POA: Diagnosis not present

## 2022-06-23 NOTE — Patient Instructions (Addendum)
You have been scheduled for an endoscopy. Please follow written instructions given to you at your visit today. If you use inhalers (even only as needed), please bring them with you on the day of your procedure.  _______________________________________________________  If your blood pressure at your visit was 140/90 or greater, please contact your primary care physician to follow up on this.  _______________________________________________________  If you are age 7 or older, your body mass index should be between 23-30. Your Body mass index is 31.28 kg/m. If this is out of the aforementioned range listed, please consider follow up with your Primary Care Provider.  If you are age 31 or younger, your body mass index should be between 19-25. Your Body mass index is 31.28 kg/m. If this is out of the aformentioned range listed, please consider follow up with your Primary Care Provider.   ________________________________________________________  The [facility] providers would like to encourage you to use [facility] to communicate with providers for non-urgent requests or questions.  Due to long hold times on the telephone, sending your provider a message by [facility] may be a faster and more efficient way to get a response.  Please allow 48 business hours for a response.  Please remember that this is for non-urgent requests.  _______________________________________________________ Thank you for choosing me and [name].  Pricilla Riffle. Dagoberto Ligas., MD., Marval Regal

## 2022-06-23 NOTE — Progress Notes (Signed)
Assessment     Epigastric pain, early satiety, dysphagia - suspected GERD symptoms. R/O esophageal stricture, esophagitis, ulcer, gastroparesis, choledocholithiasis GERD, medium-year sized hiatal hernia Dilated CBD S/P cholecystectomy, likely physiologic related to cholecystectomy.  R/O choledocholithiasis, other etiologies Personal history of adenomatous colon polyps   Recommendations    Continue rabeprazole 20 mg qam, famotidine 40 mg qpm, TUMS prn and closely follow antireflux measures Schedule EGD. The risks (including bleeding, perforation, infection, missed lesions, medication reactions and possible hospitalization or surgery if complications occur), benefits, and alternatives to endoscopy with possible biopsy and possible dilation were discussed with the patient and they consent to proceed.   Repeat LFTs in 1 month If above symptoms are not improved or if LFTs are again abnormal proceed with MRI/MRCP Surveillance colonoscopy in Jan 2028   HPI    [name] with epigastric pain, early satiety, dysphagia, dilated CBD S/P cholecystectomy.  She relates several months of postprandial epigastric and lower sternal pain, fullness, early satiety and intermittent bloating.  She has had a few episodes of dysphagia.  She was changed from pantoprazole to rabeprazole and famotidine by her PCP recently and her symptoms have slightly improved.  She has long-term alternating diarrhea and constipation which has not changed.  LFTs showed a minimally elevated alkaline phosphatase at 126 in December however repeat LFTs in January were normal.  RUQ [place] as below showed an 11.5 mm CBD and hepatic steatosis.  She did not obtain  Carafate suspension due to cost.  Denies weight loss, change in stool caliber, melena, hematochezia, nausea, vomiting.  EGD Aug 2021 Benign-appearing esophageal stenosis, dilated Medium-sized hiatal hernia Otherwise normal-appearing  Labs / Imaging    RUQ [place]  04/30/2022: 1. Common bile duct is dilated measuring up to 11.5 mm. Recommend correlation with LFTs. If abnormal, further evaluation with MRCP or ERCP may be considered. 2. Increased hepatic parenchymal echogenicity suggestive of steatosis      Latest Ref Rng & Units 05/14/2022    7:53 AM 04/22/2022    5:05 PM 11/02/2021   12:17 PM  Hepatic Function  Total Protein 6.0 - 8.3 g/dL 7.2  7.6  7.3   Albumin 3.5 - 5.2 g/dL 4.0  4.3  4.2   AST 0 - 37 U/L 14  19  20   $ ALT 0 - 35 U/L 12  17  18   $ Alk Phosphatase 39 - 117 U/L 117  126  108   Total Bilirubin 0.2 - 1.2 mg/dL 0.3  0.3  0.3        Latest Ref Rng & Units 11/02/2021   12:17 PM 04/24/2020    8:51 AM 03/27/2019    9:09 AM  CBC  WBC 4.0 - 10.5 K/uL 6.0  6.6  5.7   Hemoglobin 12.0 - 15.0 g/dL 13.5  14.0  13.8   Hematocrit 36.0 - 46.0 % 40.5  40.7  40.9   Platelets 150.0 - 400.0 K/uL 297.0  323.0  274.0     Current Medications, Allergies, Past Medical History, Past Surgical History, Family History and Social History were reviewed in [employer] record.   Physical Exam: General: Well developed, well nourished, no acute distress Head: Normocephalic and atraumatic Eyes: Sclerae anicteric, EOMI Ears: Normal auditory acuity Mouth: No deformities or lesions noted Lungs: Clear throughout to auscultation Heart: Regular rate and rhythm; No murmurs, rubs or bruits Abdomen: Soft, non tender and non distended. No masses, hepatosplenomegaly or hernias noted. Normal  Bowel sounds Rectal: Not done Musculoskeletal: Symmetrical with no gross deformities  Pulses:  Normal pulses noted Extremities: No edema or deformities noted Neurological: Alert oriented x 4, grossly nonfocal Psychological:  Alert and cooperative. Normal mood and affect   Julia Zimmerman Julia Zimmerman Plan, MD 06/23/2022, 10:42 AM

## 2022-07-01 DIAGNOSIS — K080 Exfoliation of teeth due to systemic causes: Secondary | ICD-10-CM | POA: Diagnosis not present

## 2022-07-08 ENCOUNTER — Telehealth: Payer: Self-pay | Admitting: *Deleted

## 2022-07-08 NOTE — Telephone Encounter (Signed)
Pt was on cover-my-meds need PA for Nurtec. Submitted w/  (Key: [facility]) Your information has been sent to [place]...Johny Chess

## 2022-07-09 NOTE — Telephone Encounter (Signed)
Rec'd determination med was APPROVED. Eeective 07/08/22 to 07/08/2023. Notified pt and fax approval to [facility]../lmb

## 2022-07-13 DIAGNOSIS — K080 Exfoliation of teeth due to systemic causes: Secondary | ICD-10-CM | POA: Diagnosis not present

## 2022-07-15 ENCOUNTER — Ambulatory Visit (AMBULATORY_SURGERY_CENTER): Payer: Medicare Other | Admitting: Gastroenterology

## 2022-07-15 ENCOUNTER — Encounter: Payer: Self-pay | Admitting: Gastroenterology

## 2022-07-15 VITALS — BP 135/115 | HR 82 | Temp 98.6°F | Resp 11 | Ht 62.0 in | Wt 171.0 lb

## 2022-07-15 DIAGNOSIS — R6881 Early satiety: Secondary | ICD-10-CM

## 2022-07-15 DIAGNOSIS — R1310 Dysphagia, unspecified: Secondary | ICD-10-CM | POA: Diagnosis not present

## 2022-07-15 DIAGNOSIS — K319 Disease of stomach and duodenum, unspecified: Secondary | ICD-10-CM | POA: Diagnosis not present

## 2022-07-15 DIAGNOSIS — R1013 Epigastric pain: Secondary | ICD-10-CM

## 2022-07-15 DIAGNOSIS — K219 Gastro-esophageal reflux disease without esophagitis: Secondary | ICD-10-CM

## 2022-07-15 DIAGNOSIS — K2970 Gastritis, unspecified, without bleeding: Secondary | ICD-10-CM

## 2022-07-15 MED ORDER — SODIUM CHLORIDE 0.9 % IV SOLN: 500.0000 mL | Freq: Once | INTRAVENOUS | Status: DC

## 2022-07-15 NOTE — Patient Instructions (Addendum)
-   Clear liquid diet for 2 hours, then advance as tolerated to soft diet today. - Resume prior diet tomorrow. - Follow antireflux measures long term. - Continue present medications. - Await pathology results.  YOU HAD AN ENDOSCOPIC PROCEDURE TODAY AT [facility]:   Refer to the procedure report that was given to you for any specific questions about what was found during the examination.  If the procedure report does not answer your questions, please call your gastroenterologist to clarify.  If you requested that your care partner not be given the details of your procedure findings, then the procedure report has been included in a sealed envelope for you to review at your convenience later.  YOU SHOULD EXPECT: Some feelings of bloating in the abdomen. Passage of more gas than usual.  Walking can help get rid of the air that was put into your GI tract during the procedure and reduce the bloating. If you had a lower endoscopy (such as a colonoscopy or flexible sigmoidoscopy) you may notice spotting of blood in your stool or on the toilet paper. If you underwent a bowel prep for your procedure, you may not have a normal bowel movement for a few days.  Please Note:  You might notice some irritation and congestion in your nose or some drainage.  This is from the oxygen used during your procedure.  There is no need for concern and it should clear up in a day or so.  SYMPTOMS TO REPORT IMMEDIATELY:  Following upper endoscopy (EGD)  Vomiting of blood or coffee ground material  New chest pain or pain under the shoulder blades  Painful or persistently difficult swallowing  New shortness of breath  Fever of 100F or higher  Black, tarry-looking stools  For urgent or emergent issues, a gastroenterologist can be reached at any hour by calling [phone]. Do not use MyChart messaging for urgent concerns.    DIET:  We do recommend a small meal at first, but then you may proceed to your  regular diet.  Drink plenty of fluids but you should avoid alcoholic beverages for 24 hours.  ACTIVITY:  You should plan to take it easy for the rest of today and you should NOT DRIVE or use heavy machinery until tomorrow (because of the sedation medicines used during the test).    FOLLOW UP: Our staff will call the number listed on your records the next business day following your procedure.  We will call around 7:15- 8:00 am to check on you and address any questions or concerns that you may have regarding the information given to you following your procedure. If we do not reach you, we will leave a message.     If any biopsies were taken you will be contacted by phone or by letter within the next 1-3 weeks.  Please call us at [phone] if you have not heard about the biopsies in 3 weeks.    SIGNATURES/CONFIDENTIALITY: You and/or your care partner have signed paperwork which will be entered into your electronic medical record.  These signatures attest to the fact that that the information above on your After Visit Summary has been reviewed and is understood.  Full responsibility of the confidentiality of this discharge information lies with you and/or your care-partner.

## 2022-07-15 NOTE — Progress Notes (Signed)
Pt's states no medical or surgical changes since previsit or office visit. 

## 2022-07-15 NOTE — Progress Notes (Signed)
See 06/23/2022 H&P, no changes 

## 2022-07-15 NOTE — Op Note (Signed)
[facility] Patient Name: Julia Zimmerman Procedure Date: 07/15/2022 2:02 PM MRN: [id number] Endoscopist: Ladene Artist , MD, [id number] Age: 72 Referring MD:  Date of Birth: 08-07-1950 Gender: Female Account #: [id number] Procedure:                Upper GI endoscopy Indications:              Dysphagia, Gastroesophageal reflux disease, Early                            satiety Medicines:                Monitored Anesthesia Care Procedure:                Pre-Anesthesia Assessment:                           - Prior to the procedure, a History and Physical                            was performed, and patient medications and                            allergies were reviewed. The patient's tolerance of                            previous anesthesia was also reviewed. The risks                            and benefits of the procedure and the sedation                            options and risks were discussed with the patient.                            All questions were answered, and informed consent                            was obtained. Prior Anticoagulants: The patient has                            taken no anticoagulant or antiplatelet agents. ASA                            Grade Assessment: II - A patient with mild systemic                            disease. After reviewing the risks and benefits,                            the patient was deemed in satisfactory condition to                            undergo the procedure.  After obtaining informed consent, the endoscope was                            passed under direct vision. Throughout the                            procedure, the patient's blood pressure, pulse, and                            oxygen saturations were monitored continuously. The                            GIF [id number] was introduced through the                            mouth, and advanced to the second part of  duodenum.                            The upper GI endoscopy was accomplished without                            difficulty. The patient tolerated the procedure                            well. Scope In: Scope Out: Findings:                 No endoscopic abnormality was evident in the                            esophagus to explain the patient's complaint of                            dysphagia. It was decided, however, to proceed with                            dilation of the entire esophagus. A guidewire was                            placed and the scope was withdrawn. Dilation was                            performed with a Savary dilator with no resistance                            at 17 mm. No heme.                           Patchy mild inflammation characterized by erythema                            and granularity was found in the gastric body and  in the gastric antrum. Biopsies were taken with a                            cold forceps for histology.                           A medium-sized hiatal hernia was present.                           The exam of the stomach was otherwise normal.                           The duodenal bulb and second portion of the                            duodenum were normal. Complications:            No immediate complications. Estimated Blood Loss:     Estimated blood loss was minimal. Impression:               - No endoscopic esophageal abnormality to explain                            patient's dysphagia. Esophagus dilated. Dilated.                           - Gastritis. Biopsied.                           - Medium-sized hiatal hernia.                           - Normal duodenal bulb and second portion of the                            duodenum. Recommendation:           - Patient has a contact number available for                            emergencies. The signs and symptoms of potential                             delayed complications were discussed with the                            patient. Return to normal activities tomorrow.                            Written discharge instructions were provided to the                            patient.                           - Clear liquid diet for 2 hours, then advance as  tolerated to soft diet today.                           - Resume prior diet tomorrow.                           - Follow antireflux measures long term.                           - Continue present medications.                           - Await pathology results. Ladene Artist, MD 07/15/2022 2:17:23 PM This report has been signed electronically.

## 2022-07-15 NOTE — Progress Notes (Signed)
Pt awake, alert and oriented. VSS. Airway intact. SBAR complete to RN. All questions answered.   

## 2022-07-15 NOTE — Progress Notes (Signed)
Called to room to assist during endoscopic procedure.  Patient ID and intended procedure confirmed with present staff. Received instructions for my participation in the procedure from the performing physician.  

## 2022-07-16 ENCOUNTER — Telehealth: Payer: Self-pay

## 2022-07-16 NOTE — Telephone Encounter (Signed)
  Follow up Call-     07/15/2022    1:12 PM 12/26/2019    3:24 PM  Call back number  Post procedure Call Back phone  # [phone]  Permission to leave phone message Yes Yes     Patient questions:  Do you have a fever, pain , or abdominal swelling? No. Pain Score  0 *  Have you tolerated food without any problems? Yes.    Have you been able to return to your normal activities? Yes.    Do you have any questions about your discharge instructions: Diet   No. Medications  No. Follow up visit  No.  Do you have questions or concerns about your Care? No.  Actions: * If pain score is 4 or above: No action needed, pain <4.

## 2022-08-04 ENCOUNTER — Encounter: Payer: Self-pay | Admitting: Gastroenterology

## 2022-08-06 ENCOUNTER — Other Ambulatory Visit: Payer: Self-pay | Admitting: Internal Medicine

## 2022-08-07 ENCOUNTER — Other Ambulatory Visit: Payer: Self-pay | Admitting: Internal Medicine

## 2022-08-07 DIAGNOSIS — R519 Headache, unspecified: Secondary | ICD-10-CM

## 2022-08-17 ENCOUNTER — Telehealth: Payer: Self-pay | Admitting: Internal Medicine

## 2022-08-17 NOTE — Telephone Encounter (Signed)
Prescription Request  08/17/2022  LOV: 05/17/2022  What is the name of the medication or equipment? traMADol (ULTRAM) 50 MG tablet  Pt need a new Rx sent to this location because [facility]caid [facility] not [facility] Have you contacted your pharmacy to request a refill? No   Which pharmacy would you like this sent to?     [id number] [place], [place] - [address] AT [facility] [address] [place] [address] Phone: [phone] Fax: [phone]   Please advise at Mobile [phone] (mobile)

## 2022-08-18 MED ORDER — TRAMADOL HCL 50 MG PO TABS: 50.0000 mg | ORAL_TABLET | Freq: Four times a day (QID) | ORAL | 1 refills | Status: DC | PRN

## 2022-08-18 NOTE — Telephone Encounter (Signed)
It is done.  Thanks 

## 2022-08-30 DIAGNOSIS — H2511 Age-related nuclear cataract, right eye: Secondary | ICD-10-CM | POA: Diagnosis not present

## 2022-08-30 DIAGNOSIS — H2513 Age-related nuclear cataract, bilateral: Secondary | ICD-10-CM | POA: Diagnosis not present

## 2022-08-30 DIAGNOSIS — H25043 Posterior subcapsular polar age-related cataract, bilateral: Secondary | ICD-10-CM | POA: Diagnosis not present

## 2022-08-30 DIAGNOSIS — H01023 Squamous blepharitis right eye, unspecified eyelid: Secondary | ICD-10-CM | POA: Diagnosis not present

## 2022-08-30 DIAGNOSIS — H25013 Cortical age-related cataract, bilateral: Secondary | ICD-10-CM | POA: Diagnosis not present

## 2022-09-15 ENCOUNTER — Other Ambulatory Visit: Payer: Self-pay | Admitting: Internal Medicine

## 2022-09-16 DIAGNOSIS — K080 Exfoliation of teeth due to systemic causes: Secondary | ICD-10-CM | POA: Diagnosis not present

## 2022-09-20 DIAGNOSIS — H2513 Age-related nuclear cataract, bilateral: Secondary | ICD-10-CM | POA: Diagnosis not present

## 2022-09-21 DIAGNOSIS — H2511 Age-related nuclear cataract, right eye: Secondary | ICD-10-CM | POA: Diagnosis not present

## 2022-09-21 DIAGNOSIS — H2512 Age-related nuclear cataract, left eye: Secondary | ICD-10-CM | POA: Diagnosis not present

## 2022-09-21 DIAGNOSIS — H269 Unspecified cataract: Secondary | ICD-10-CM | POA: Diagnosis not present

## 2022-09-27 DIAGNOSIS — H2513 Age-related nuclear cataract, bilateral: Secondary | ICD-10-CM | POA: Diagnosis not present

## 2022-09-28 DIAGNOSIS — H269 Unspecified cataract: Secondary | ICD-10-CM | POA: Diagnosis not present

## 2022-09-28 DIAGNOSIS — H2511 Age-related nuclear cataract, right eye: Secondary | ICD-10-CM | POA: Diagnosis not present

## 2022-09-28 DIAGNOSIS — H2512 Age-related nuclear cataract, left eye: Secondary | ICD-10-CM | POA: Diagnosis not present

## 2022-10-05 ENCOUNTER — Other Ambulatory Visit: Payer: Self-pay | Admitting: Internal Medicine

## 2022-10-05 DIAGNOSIS — R519 Headache, unspecified: Secondary | ICD-10-CM

## 2022-10-12 ENCOUNTER — Other Ambulatory Visit: Payer: Self-pay | Admitting: *Deleted

## 2022-10-12 DIAGNOSIS — R519 Headache, unspecified: Secondary | ICD-10-CM

## 2022-10-12 NOTE — Telephone Encounter (Signed)
Pt requesting refill on her But/Acetaminophen/caff 50/325/40 mg.../lmb

## 2022-10-13 NOTE — Telephone Encounter (Signed)
Patient called back checking on this rx.  She states that she has an upcoming appointment and needs her medication.

## 2022-10-14 ENCOUNTER — Telehealth: Payer: Self-pay | Admitting: Internal Medicine

## 2022-10-14 NOTE — Telephone Encounter (Signed)
Prescription Request  10/14/2022  LOV: 05/17/2022  What is the name of the medication or equipment? butalbital-acetaminophen-caffeine (FIORICET) 50-325-40 MG tablet   Have you contacted your pharmacy to request a refill? Yes   Which pharmacy would you like this sent to?  [id number] - [place], [place] - [address] AT [facility] OF [address] [place] [address] Phone: [phone] Fax: [phone]    Patient notified that their request is being sent to the clinical staff for review and that they should receive a response within 2 business days.   Please advise at Mobile [phone] (mobile)     Patient and her pharmacy have called multiple times about refilling above medication. Her pharmacy said they would cancel the order since there has been no response. Patient has upcoming OV on 10/21/2022.

## 2022-10-15 MED ORDER — BUTALBITAL-APAP-CAFFEINE 50-325-40 MG PO TABS: 1.0000 | ORAL_TABLET | Freq: Three times a day (TID) | ORAL | 1 refills | Status: DC | PRN

## 2022-10-15 NOTE — Telephone Encounter (Signed)
Addressed.  Thanks.  

## 2022-10-21 ENCOUNTER — Ambulatory Visit (INDEPENDENT_AMBULATORY_CARE_PROVIDER_SITE_OTHER): Payer: Medicare Other | Admitting: Internal Medicine

## 2022-10-21 ENCOUNTER — Encounter: Payer: Self-pay | Admitting: Internal Medicine

## 2022-10-21 VITALS — BP 110/80 | HR 85 | Temp 97.8°F | Ht 62.0 in | Wt 168.0 lb

## 2022-10-21 DIAGNOSIS — I2510 Atherosclerotic heart disease of native coronary artery without angina pectoris: Secondary | ICD-10-CM | POA: Diagnosis not present

## 2022-10-21 DIAGNOSIS — F419 Anxiety disorder, unspecified: Secondary | ICD-10-CM

## 2022-10-21 DIAGNOSIS — I2583 Coronary atherosclerosis due to lipid rich plaque: Secondary | ICD-10-CM | POA: Diagnosis not present

## 2022-10-21 DIAGNOSIS — F32A Depression, unspecified: Secondary | ICD-10-CM

## 2022-10-21 DIAGNOSIS — F988 Other specified behavioral and emotional disorders with onset usually occurring in childhood and adolescence: Secondary | ICD-10-CM

## 2022-10-21 DIAGNOSIS — I10 Essential (primary) hypertension: Secondary | ICD-10-CM

## 2022-10-21 MED ORDER — RIZATRIPTAN BENZOATE 10 MG PO TABS: 10.0000 mg | ORAL_TABLET | Freq: Once | ORAL | 12 refills | Status: AC | PRN

## 2022-10-21 MED ORDER — AMPHETAMINE-DEXTROAMPHET ER 5 MG PO CP24: 5.0000 mg | ORAL_CAPSULE | Freq: Every day | ORAL | 0 refills | Status: DC

## 2022-10-21 NOTE — Progress Notes (Signed)
Subjective:  Patient ID: Julia Zimmerman, female    DOB: 12-18-1950  Age: 72 y.o. MRN: [id number]  CC: Follow-up (3 mnth f/u)   HPI ARRAYA PLATTEN presents for ADD, GERD, HAs  Outpatient Medications Prior to Visit  Medication Sig Dispense Refill   alprazolam (XANAX) 2 MG tablet TAKE 1 TABLET BY MOUTH AT BEDTIME AS NEEDED FOR INSOMNIA 90 tablet 1   aluminum-magnesium hydroxide 200-200 MG/5ML suspension Take 15 mLs by mouth every 6 (six) hours as needed for indigestion.     amphetamine-dextroamphetamine (ADDERALL XR) 5 MG 24 hr capsule Take 1 capsule (5 mg total) by mouth daily. 90 capsule 0   B Complex-C-Folic Acid (B-COMPLEX/FOLIC ACID/VITAMIN C) TBCR TAKE 1 TABLET BY MOUTH EVERY DAY 125 tablet 3   butalbital-acetaminophen-caffeine (FIORICET) 50-325-40 MG tablet Take 1 tablet by mouth 3 (three) times daily as needed. 90 tablet 1   diphenhydrAMINE (BENADRYL) 25 MG tablet Take 25 mg by mouth daily as needed for allergies.     famotidine (PEPCID) 40 MG tablet Take 1 tablet (40 mg total) by mouth at bedtime. 90 tablet 3   losartan-hydrochlorothiazide (HYZAAR) 50-12.5 MG tablet TAKE 1 TABLET BY MOUTH EVERY DAY 90 tablet 3   pravastatin (PRAVACHOL) 10 MG tablet Take 1 tablet (10 mg total) by mouth daily. 90 tablet 3   RABEprazole (ACIPHEX) 20 MG tablet Take 1 tablet (20 mg total) by mouth daily. 30 tablet 11   traMADol (ULTRAM) 50 MG tablet Take 1 tablet (50 mg total) by mouth every 6 (six) hours as needed. 120 tablet 1   NURTEC 75 MG TBDP TAKE 1 TABLET BY MOUTH EVERY OTHER DAY 15 tablet 5   celecoxib (CELEBREX) 200 MG capsule Take 1 capsule (200 mg total) by mouth 2 (two) times daily. (Patient not taking: Reported on 07/15/2022) 180 capsule 1   sucralfate (CARAFATE) 1 GM/10ML suspension Take 10 mLs (1 g total) by mouth 4 (four) times daily -  with meals and at bedtime. (Patient not taking: Reported on 07/15/2022) 420 mL 3   Vitamin D, Ergocalciferol, (DRISDOL) 1.25 MG (50000 UNIT) CAPS capsule  TAKE 1 CAPSULE (50,000 UNITS TOTAL) BY MOUTH EVERY 7 (SEVEN) DAYS 3 capsule 3   No facility-administered medications prior to visit.    ROS: Review of Systems  Constitutional:  Positive for fatigue. Negative for activity change, appetite change, chills and unexpected weight change.  HENT:  Negative for congestion, mouth sores and sinus pressure.   Eyes:  Negative for visual disturbance.  Respiratory:  Negative for cough and chest tightness.   Gastrointestinal:  Negative for abdominal pain and nausea.  Genitourinary:  Negative for difficulty urinating, frequency and vaginal pain.  Musculoskeletal:  Negative for back pain and gait problem.  Skin:  Negative for pallor and rash.  Neurological:  Positive for dizziness. Negative for tremors, weakness, numbness and headaches.  Psychiatric/Behavioral:  Positive for decreased concentration and suicidal ideas. Negative for confusion and sleep disturbance. The patient is nervous/anxious.     Objective:  BP 110/80 (BP Location: Right Arm, Patient Position: Sitting, Cuff Size: Large)   Pulse 85   Temp 97.8 F (36.6 C) (Oral)   Ht 5\' 2"  (1.575 m)   Wt 168 lb (76.2 kg)   SpO2 95%   BMI 30.73 kg/m   BP Readings from Last 3 Encounters:  10/21/22 110/80  07/15/22 (!) 135/115  06/23/22 110/70    Wt Readings from Last 3 Encounters:  10/21/22 168 lb (76.2 kg)  07/15/22 171  lb (77.6 kg)  06/23/22 171 lb (77.6 kg)    Physical Exam Constitutional:      General: She is not in acute distress.    Appearance: Normal appearance. She is well-developed.  HENT:     Head: Normocephalic.     Right Ear: External ear normal.     Left Ear: External ear normal.     Nose: Nose normal.  Eyes:     General:        Right eye: No discharge.        Left eye: No discharge.     Conjunctiva/sclera: Conjunctivae normal.     Pupils: Pupils are equal, round, and reactive to light.  Neck:     Thyroid: No thyromegaly.     Vascular: No JVD.     Trachea: No  tracheal deviation.  Cardiovascular:     Rate and Rhythm: Normal rate and regular rhythm.     Heart sounds: Normal heart sounds.  Pulmonary:     Effort: No respiratory distress.     Breath sounds: No stridor. No wheezing.  Abdominal:     General: Bowel sounds are normal. There is no distension.     Palpations: Abdomen is soft. There is no mass.     Tenderness: There is no abdominal tenderness. There is no guarding or rebound.  Musculoskeletal:        General: No tenderness.     Cervical back: Normal range of motion and neck supple. No rigidity.  Lymphadenopathy:     Cervical: No cervical adenopathy.  Skin:    Findings: No erythema or rash.  Neurological:     Mental Status: She is oriented to person, place, and time.     Cranial Nerves: No cranial nerve deficit.     Motor: No abnormal muscle tone.     Coordination: Coordination normal.     Deep Tendon Reflexes: Reflexes normal.  Psychiatric:        Behavior: Behavior normal.        Thought Content: Thought content normal.        Judgment: Judgment normal.     Lab Results  Component Value Date   WBC 6.0 11/02/2021   HGB 13.5 11/02/2021   HCT 40.5 11/02/2021   PLT 297.0 11/02/2021   GLUCOSE 96 05/14/2022   CHOL 258 (H) 04/22/2022   TRIG 321.0 (H) 04/22/2022   HDL 49.10 04/22/2022   LDLDIRECT 120.0 04/22/2022   LDLCALC 56 04/21/2021   ALT 12 05/14/2022   AST 14 05/14/2022   NA 141 05/14/2022   K 4.3 05/14/2022   CL 102 05/14/2022   CREATININE 0.74 05/14/2022   BUN 16 05/14/2022   CO2 29 05/14/2022   TSH 1.47 04/22/2022   INR 0.97 04/14/2018   HGBA1C 5.6 04/21/2021    US Abdomen Limited RUQ (LIVER/GB)  Result Date: 04/30/2022 CLINICAL DATA:  Elevated alkaline phosphatase EXAM: ULTRASOUND ABDOMEN LIMITED RIGHT UPPER QUADRANT COMPARISON:  CT chest April 28, 2020 FINDINGS: Gallbladder: Surgically absent Common bile duct: Diameter: 11.5 mm distally Liver: Increased hepatic parenchymal echogenicity. No focal  lesion. Portal vein is patent on color Doppler imaging with normal direction of blood flow towards the liver. Other: None. IMPRESSION: 1. Common bile duct is dilated measuring up to 11.5 mm. Recommend correlation with LFTs. If abnormal, further evaluation with MRCP or ERCP may be considered. 2. Increased hepatic parenchymal echogenicity suggestive of steatosis. Electronically Signed   By: Annia Belt M.D.   On: 04/30/2022 10:34  Assessment & Plan:   Problem List Items Addressed This Visit     ADD (attention deficit disorder)    Potential benefits of a long term amphetamines  use as well as potential risks  and complications were explained to the patient and were aknowledged.        Anxiety and depression    Xanax prn Doxepine at hs d/c  Potential benefits of a long term benzodiazepines  use as well as potential risks  and complications were explained to the patient and were aknowledged.      Coronary atherosclerosis    Change to Pravachol 2023 On Crestor - cramps; had to stop Started on pravastatin if tolerated      Essential hypertension, benign - Primary    NAS diet Continue on losartan HCTZ         No orders of the defined types were placed in this encounter.     Follow-up: No follow-ups on file.  Sonda Primes, MD

## 2022-10-21 NOTE — Assessment & Plan Note (Signed)
Xanax prn Doxepine at hs d/c  Potential benefits of a long term benzodiazepines  use as well as potential risks  and complications were explained to the patient and were aknowledged.

## 2022-10-21 NOTE — Assessment & Plan Note (Signed)
Change to Pravachol 2023 On Crestor - cramps; had to stop Started on pravastatin if tolerated

## 2022-10-21 NOTE — Assessment & Plan Note (Signed)
Potential benefits of a long term amphetamines use as well as potential risks  and complications were explained to the patient and were aknowledged.  

## 2022-10-21 NOTE — Assessment & Plan Note (Signed)
NAS diet Continue on losartan HCTZ

## 2022-11-07 ENCOUNTER — Other Ambulatory Visit: Payer: Self-pay | Admitting: Internal Medicine

## 2022-11-09 ENCOUNTER — Telehealth: Payer: Self-pay | Admitting: Internal Medicine

## 2022-11-09 NOTE — Telephone Encounter (Signed)
Patient called and said [facility] informed her that none of their locations have amphetamine-dextroamphetamine (ADDERALL XR) 5 MG 24 hr capsule. She would like to know [facility] [address] [facility]. Best callback is [phone].

## 2022-11-11 MED ORDER — AMPHETAMINE-DEXTROAMPHET ER 5 MG PO CP24: 5.0000 mg | ORAL_CAPSULE | Freq: Every day | ORAL | 0 refills | Status: DC

## 2022-11-11 NOTE — Telephone Encounter (Signed)
Okay.  Thanks.

## 2022-12-06 ENCOUNTER — Other Ambulatory Visit: Payer: Self-pay | Admitting: Internal Medicine

## 2022-12-06 DIAGNOSIS — R519 Headache, unspecified: Secondary | ICD-10-CM

## 2022-12-08 ENCOUNTER — Other Ambulatory Visit: Payer: Self-pay | Admitting: Internal Medicine

## 2022-12-08 DIAGNOSIS — R519 Headache, unspecified: Secondary | ICD-10-CM

## 2022-12-18 DIAGNOSIS — N390 Urinary tract infection, site not specified: Secondary | ICD-10-CM | POA: Diagnosis not present

## 2023-01-04 DIAGNOSIS — B3731 Acute candidiasis of vulva and vagina: Secondary | ICD-10-CM | POA: Diagnosis not present

## 2023-01-04 DIAGNOSIS — R300 Dysuria: Secondary | ICD-10-CM | POA: Diagnosis not present

## 2023-01-06 ENCOUNTER — Other Ambulatory Visit: Payer: Self-pay | Admitting: Internal Medicine

## 2023-01-06 DIAGNOSIS — R519 Headache, unspecified: Secondary | ICD-10-CM

## 2023-01-11 ENCOUNTER — Telehealth: Payer: Self-pay | Admitting: Internal Medicine

## 2023-01-11 DIAGNOSIS — R519 Headache, unspecified: Secondary | ICD-10-CM

## 2023-01-11 NOTE — Telephone Encounter (Signed)
Prescription Request  01/11/2023  LOV: 10/21/2022  What is the name of the medication or equipment?  butalbital-acetaminophen-caffeine (FIORICET) 50-325-40 MG tablet   Have you contacted your pharmacy to request a refill? No  [facility]   [address] [place], [place] [address] [phone]  Which pharmacy would you like this sent to?  Patient notified that their request is being sent to the clinical staff for review and that they should receive a response within 2 business days.   Please advise at Mobile [phone] (mobile)

## 2023-01-12 MED ORDER — BUTALBITAL-APAP-CAFFEINE 50-325-40 MG PO TABS
1.0000 | ORAL_TABLET | Freq: Three times a day (TID) | ORAL | 0 refills | Status: DC | PRN
Start: 2023-01-12 — End: 2023-02-14

## 2023-01-12 NOTE — Telephone Encounter (Signed)
Okay.  Thanks.

## 2023-01-20 DIAGNOSIS — Z113 Encounter for screening for infections with a predominantly sexual mode of transmission: Secondary | ICD-10-CM | POA: Diagnosis not present

## 2023-01-20 DIAGNOSIS — B3731 Acute candidiasis of vulva and vagina: Secondary | ICD-10-CM | POA: Diagnosis not present

## 2023-01-20 DIAGNOSIS — Z1331 Encounter for screening for depression: Secondary | ICD-10-CM | POA: Diagnosis not present

## 2023-01-20 DIAGNOSIS — Z01411 Encounter for gynecological examination (general) (routine) with abnormal findings: Secondary | ICD-10-CM | POA: Diagnosis not present

## 2023-01-20 DIAGNOSIS — N760 Acute vaginitis: Secondary | ICD-10-CM | POA: Diagnosis not present

## 2023-01-20 DIAGNOSIS — Z1231 Encounter for screening mammogram for malignant neoplasm of breast: Secondary | ICD-10-CM | POA: Diagnosis not present

## 2023-01-20 LAB — HM MAMMOGRAPHY

## 2023-02-10 ENCOUNTER — Telehealth: Payer: Self-pay | Admitting: Internal Medicine

## 2023-02-10 ENCOUNTER — Ambulatory Visit: Payer: Medicare Other | Admitting: Internal Medicine

## 2023-02-10 ENCOUNTER — Encounter: Payer: Self-pay | Admitting: Internal Medicine

## 2023-02-10 VITALS — BP 120/70 | HR 93 | Temp 98.3°F | Ht 62.0 in | Wt 165.0 lb

## 2023-02-10 DIAGNOSIS — E785 Hyperlipidemia, unspecified: Secondary | ICD-10-CM

## 2023-02-10 DIAGNOSIS — Z01818 Encounter for other preprocedural examination: Secondary | ICD-10-CM | POA: Diagnosis not present

## 2023-02-10 DIAGNOSIS — I2583 Coronary atherosclerosis due to lipid rich plaque: Secondary | ICD-10-CM

## 2023-02-10 DIAGNOSIS — F902 Attention-deficit hyperactivity disorder, combined type: Secondary | ICD-10-CM

## 2023-02-10 DIAGNOSIS — F419 Anxiety disorder, unspecified: Secondary | ICD-10-CM

## 2023-02-10 DIAGNOSIS — F32A Depression, unspecified: Secondary | ICD-10-CM

## 2023-02-10 DIAGNOSIS — R002 Palpitations: Secondary | ICD-10-CM

## 2023-02-10 LAB — CBC WITH DIFFERENTIAL/PLATELET
Basophils Absolute: 0.1 10*3/uL (ref 0.0–0.1)
Basophils Relative: 1.1 % (ref 0.0–3.0)
Eosinophils Absolute: 0.2 10*3/uL (ref 0.0–0.7)
Eosinophils Relative: 2.1 % (ref 0.0–5.0)
HCT: 43.4 % (ref 36.0–46.0)
Hemoglobin: 14.4 g/dL (ref 12.0–15.0)
Lymphocytes Relative: 29.5 % (ref 12.0–46.0)
Lymphs Abs: 2.2 10*3/uL (ref 0.7–4.0)
MCHC: 33.1 g/dL (ref 30.0–36.0)
MCV: 90.8 fL (ref 78.0–100.0)
Monocytes Absolute: 0.8 10*3/uL (ref 0.1–1.0)
Monocytes Relative: 10.5 % (ref 3.0–12.0)
Neutro Abs: 4.2 10*3/uL (ref 1.4–7.7)
Neutrophils Relative %: 56.8 % (ref 43.0–77.0)
Platelets: 315 10*3/uL (ref 150.0–400.0)
RBC: 4.78 Mil/uL (ref 3.87–5.11)
RDW: 13.4 % (ref 11.5–15.5)
WBC: 7.5 10*3/uL (ref 4.0–10.5)

## 2023-02-10 LAB — T3, FREE: T3, Free: 3.7 pg/mL (ref 2.3–4.2)

## 2023-02-10 LAB — COMPREHENSIVE METABOLIC PANEL
ALT: 26 U/L (ref 0–35)
AST: 24 U/L (ref 0–37)
Albumin: 4.3 g/dL (ref 3.5–5.2)
Alkaline Phosphatase: 106 U/L (ref 39–117)
BUN: 13 mg/dL (ref 6–23)
CO2: 32 meq/L (ref 19–32)
Calcium: 10 mg/dL (ref 8.4–10.5)
Chloride: 100 meq/L (ref 96–112)
Creatinine, Ser: 0.69 mg/dL (ref 0.40–1.20)
GFR: 87.84 mL/min (ref >60.00)
Glucose, Bld: 107 mg/dL — ABNORMAL HIGH (ref 70–99)
Potassium: 4.2 meq/L (ref 3.5–5.1)
Sodium: 138 meq/L (ref 135–145)
Total Bilirubin: 0.3 mg/dL (ref 0.2–1.2)
Total Protein: 7 g/dL (ref 6.0–8.3)

## 2023-02-10 LAB — PROTIME-INR
INR: 1 {ratio} (ref 0.8–1.0)
Prothrombin Time: 10.6 s (ref 9.6–13.1)

## 2023-02-10 LAB — TSH: TSH: 1.22 u[IU]/mL (ref 0.35–5.50)

## 2023-02-10 LAB — T4, FREE: Free T4: 0.8 ng/dL (ref 0.60–1.60)

## 2023-02-10 MED ORDER — AMPHETAMINE-DEXTROAMPHET ER 15 MG PO CP24
15.0000 mg | ORAL_CAPSULE | ORAL | 0 refills | Status: DC
Start: 2023-02-10 — End: 2023-04-05

## 2023-02-10 NOTE — Assessment & Plan Note (Addendum)
ADD - needs 15 mg Adderall XR to control sx's Rx given Start in 2-3 weeks

## 2023-02-10 NOTE — Telephone Encounter (Signed)
A representative from [facility] called and said they faxed a medical clearance form over on 02/08/2023. Patient is scheduled for the procedure on 02/14/2023.  Patient has an appointment today 02/10/2023 for a surgical clearance appointment and [place] would like to know if that form can be completed and faxed back ASAP due to how soon the procedure is.  Best callback for Meredith Leeds is [phone]

## 2023-02-10 NOTE — Assessment & Plan Note (Addendum)
Julia Zimmerman is clear for surgery -abdominal liposuction EKG, labs were obtained

## 2023-02-10 NOTE — Progress Notes (Signed)
Subjective:  Patient ID: Julia Zimmerman, female    DOB: Jun 23, 1950  Age: 72 y.o. MRN: [id number]  CC: Medical Clearance   HPI Julia Zimmerman presents for a pre-op clearance -abdominal liposuction F/u ADD - needs 15 mg Adderall XR, OA, anxiety  Outpatient Medications Prior to Visit  Medication Sig Dispense Refill   alprazolam (XANAX) 2 MG tablet TAKE 1 TABLET BY MOUTH AT BEDTIME AS NEEDED FOR INSOMNIA 90 tablet 1   aluminum-magnesium hydroxide 200-200 MG/5ML suspension Take 15 mLs by mouth every 6 (six) hours as needed for indigestion.     B Complex-C-Folic Acid (B-COMPLEX/FOLIC ACID/VITAMIN C) TBCR TAKE 1 TABLET BY MOUTH EVERY DAY 125 tablet 3   diphenhydrAMINE (BENADRYL) 25 MG tablet Take 25 mg by mouth daily as needed for allergies.     famotidine (PEPCID) 40 MG tablet Take 1 tablet (40 mg total) by mouth at bedtime. 90 tablet 3   losartan-hydrochlorothiazide (HYZAAR) 50-12.5 MG tablet TAKE 1 TABLET BY MOUTH EVERY DAY 90 tablet 3   pravastatin (PRAVACHOL) 10 MG tablet Take 1 tablet (10 mg total) by mouth daily. 90 tablet 3   RABEprazole (ACIPHEX) 20 MG tablet Take 1 tablet (20 mg total) by mouth daily. 30 tablet 11   rizatriptan (MAXALT) 10 MG tablet Take 1 tablet (10 mg total) by mouth once as needed for up to 1 dose for migraine. May repeat in 2 hours if needed 12 tablet 12   traMADol (ULTRAM) 50 MG tablet TAKE 1/2-1 TABLET BY MOUTH EVERY 6 (SIX) HOURS AS NEEDED FOR SEVERE PAIN. 120 tablet 1   amphetamine-dextroamphetamine (ADDERALL XR) 5 MG 24 hr capsule Take 1 capsule (5 mg total) by mouth daily. 90 capsule 0   butalbital-acetaminophen-caffeine (FIORICET) 50-325-40 MG tablet Take 1 tablet by mouth 3 (three) times daily as needed. 90 tablet 0   celecoxib (CELEBREX) 200 MG capsule Take 1 capsule (200 mg total) by mouth 2 (two) times daily. (Patient not taking: Reported on 07/15/2022) 180 capsule 1   sucralfate (CARAFATE) 1 GM/10ML suspension Take 10 mLs (1 g total) by mouth 4 (four)  times daily -  with meals and at bedtime. (Patient not taking: Reported on 07/15/2022) 420 mL 3   No facility-administered medications prior to visit.    ROS: Review of Systems  Constitutional:  Negative for activity change, appetite change, chills, fatigue and unexpected weight change.  HENT:  Negative for congestion, mouth sores and sinus pressure.   Eyes:  Negative for visual disturbance.  Respiratory:  Negative for cough and chest tightness.   Cardiovascular:  Negative for chest pain and palpitations.  Gastrointestinal:  Negative for abdominal pain and nausea.  Genitourinary:  Negative for difficulty urinating, frequency and vaginal pain.  Musculoskeletal:  Negative for back pain and gait problem.  Skin:  Negative for pallor and rash.  Neurological:  Negative for dizziness, tremors, weakness, numbness and headaches.  Hematological:  Does not bruise/bleed easily.  Psychiatric/Behavioral:  Positive for decreased concentration. Negative for confusion, sleep disturbance and suicidal ideas. The patient is nervous/anxious.     Objective:  BP 120/70 (BP Location: Right Arm, Patient Position: Sitting, Cuff Size: Normal)   Pulse 93   Temp 98.3 F (36.8 C) (Oral)   Ht 5\' 2"  (1.575 m)   Wt 165 lb (74.8 kg)   SpO2 95%   BMI 30.18 kg/m   BP Readings from Last 3 Encounters:  02/10/23 120/70  10/21/22 110/80  07/15/22 (!) 135/115    Wt Readings from  Last 3 Encounters:  02/10/23 165 lb (74.8 kg)  10/21/22 168 lb (76.2 kg)  07/15/22 171 lb (77.6 kg)    Physical Exam Constitutional:      General: She is not in acute distress.    Appearance: She is well-developed. She is obese.  HENT:     Head: Normocephalic.     Right Ear: External ear normal.     Left Ear: External ear normal.     Nose: Nose normal.  Eyes:     General:        Right eye: No discharge.        Left eye: No discharge.     Conjunctiva/sclera: Conjunctivae normal.     Pupils: Pupils are equal, round, and  reactive to light.  Neck:     Thyroid: No thyromegaly.     Vascular: No JVD.     Trachea: No tracheal deviation.  Cardiovascular:     Rate and Rhythm: Regular rhythm. Tachycardia present.     Heart sounds: Normal heart sounds.  Pulmonary:     Effort: No respiratory distress.     Breath sounds: No stridor. No wheezing.  Abdominal:     General: Bowel sounds are normal. There is no distension.     Palpations: Abdomen is soft. There is no mass.     Tenderness: There is no abdominal tenderness. There is no guarding or rebound.  Musculoskeletal:        General: No tenderness.     Cervical back: Normal range of motion and neck supple. No rigidity.  Lymphadenopathy:     Cervical: No cervical adenopathy.  Skin:    Findings: No erythema or rash.  Neurological:     Cranial Nerves: No cranial nerve deficit.     Motor: No abnormal muscle tone.     Coordination: Coordination normal.     Deep Tendon Reflexes: Reflexes normal.  Psychiatric:        Behavior: Behavior normal.        Thought Content: Thought content normal.        Judgment: Judgment normal.    Procedure: EKG Indication: pre-op exam Impression: S tachycardia. No acute changes.  Lab Results  Component Value Date   WBC 7.5 02/10/2023   HGB 14.4 02/10/2023   HCT 43.4 02/10/2023   PLT 315.0 02/10/2023   GLUCOSE 107 (H) 02/10/2023   CHOL 258 (H) 04/22/2022   TRIG 321.0 (H) 04/22/2022   HDL 49.10 04/22/2022   LDLDIRECT 120.0 04/22/2022   LDLCALC 56 04/21/2021   ALT 26 02/10/2023   AST 24 02/10/2023   NA 138 02/10/2023   K 4.2 02/10/2023   CL 100 02/10/2023   CREATININE 0.69 02/10/2023   BUN 13 02/10/2023   CO2 32 02/10/2023   TSH 1.22 02/10/2023   INR 1.0 02/10/2023   HGBA1C 5.6 04/21/2021    US Abdomen Limited RUQ (LIVER/GB)  Result Date: 04/30/2022 CLINICAL DATA:  Elevated alkaline phosphatase EXAM: ULTRASOUND ABDOMEN LIMITED RIGHT UPPER QUADRANT COMPARISON:  CT chest April 28, 2020 FINDINGS: Gallbladder:  Surgically absent Common bile duct: Diameter: 11.5 mm distally Liver: Increased hepatic parenchymal echogenicity. No focal lesion. Portal vein is patent on color Doppler imaging with normal direction of blood flow towards the liver. Other: None. IMPRESSION: 1. Common bile duct is dilated measuring up to 11.5 mm. Recommend correlation with LFTs. If abnormal, further evaluation with MRCP or ERCP may be considered. 2. Increased hepatic parenchymal echogenicity suggestive of steatosis. Electronically Signed   By: Kenard Gower  Earlene Plater M.D.   On: 04/30/2022 10:34    Assessment & Plan:   Problem List Items Addressed This Visit     Anxiety and depression    Doing well, [place] has retired      ADD (attention deficit disorder) - Primary    ADD - needs 15 mg Adderall XR to control sx's Rx given Start in 2-3 weeks      Palpitations    No relapse. Mild tachycardia      Relevant Orders   CBC with Differential/Platelet (Completed)   Comprehensive metabolic panel (Completed)   T3, free (Completed)   T4, free (Completed)   TSH (Completed)   Protime-INR (Completed)   EKG 12-Lead (Completed)   Hyperlipidemia    On Crestor - cramps; had to stop Started on pravastatin if tolerated      Coronary atherosclerosis   Preop exam for internal medicine    Jaimarie is clear for surgery -abdominal liposuction EKG, labs were obtained      Relevant Orders   CBC with Differential/Platelet (Completed)   Comprehensive metabolic panel (Completed)   T3, free (Completed)   T4, free (Completed)   TSH (Completed)   Protime-INR (Completed)   EKG 12-Lead (Completed)      Meds ordered this encounter  Medications   amphetamine-dextroamphetamine (ADDERALL XR) 15 MG 24 hr capsule    Sig: Take 1 capsule by mouth every morning.    Dispense:  30 capsule    Refill:  0      Follow-up: Return in about 3 months (around 05/13/2023) for a follow-up visit.  Sonda Primes, MD

## 2023-02-10 NOTE — Assessment & Plan Note (Signed)
No relapse. Mild tachycardia

## 2023-02-11 NOTE — Telephone Encounter (Signed)
Done. Thx.

## 2023-02-13 ENCOUNTER — Other Ambulatory Visit: Payer: Self-pay | Admitting: Internal Medicine

## 2023-02-13 DIAGNOSIS — R519 Headache, unspecified: Secondary | ICD-10-CM

## 2023-02-15 ENCOUNTER — Encounter: Payer: Self-pay | Admitting: Internal Medicine

## 2023-02-15 NOTE — Assessment & Plan Note (Addendum)
Doing well, Julia Zimmerman has retired

## 2023-02-15 NOTE — Assessment & Plan Note (Signed)
On Crestor - cramps; had to stop Started on pravastatin if tolerated

## 2023-02-17 ENCOUNTER — Other Ambulatory Visit: Payer: Self-pay | Admitting: Internal Medicine

## 2023-03-23 ENCOUNTER — Other Ambulatory Visit: Payer: Self-pay | Admitting: Internal Medicine

## 2023-04-05 ENCOUNTER — Telehealth: Payer: Self-pay | Admitting: Internal Medicine

## 2023-04-05 MED ORDER — AMPHETAMINE-DEXTROAMPHET ER 15 MG PO CP24: 15.0000 mg | ORAL_CAPSULE | ORAL | 0 refills | Status: DC

## 2023-04-05 NOTE — Telephone Encounter (Signed)
Okay.  Thanks.

## 2023-04-05 NOTE — Telephone Encounter (Signed)
Prescription Request  04/05/2023  LOV: 02/10/2023  What is the name of the medication or equipment? adderall  Have you contacted your pharmacy to request a refill? Yes   Which pharmacy would you like this sent to?  [id number] [place], [place] - [address] AT CORNER OF [facility] [address] [place] [address] Phone: [phone] Fax: [phone]   Patient notified that their request is being sent to the clinical staff for review and that they should receive a response within 2 business days.   Please advise at Mobile [phone] (mobile)

## 2023-04-12 DIAGNOSIS — N760 Acute vaginitis: Secondary | ICD-10-CM | POA: Diagnosis not present

## 2023-04-17 ENCOUNTER — Other Ambulatory Visit: Payer: Self-pay | Admitting: Internal Medicine

## 2023-04-17 DIAGNOSIS — R519 Headache, unspecified: Secondary | ICD-10-CM

## 2023-04-19 DIAGNOSIS — M1712 Unilateral primary osteoarthritis, left knee: Secondary | ICD-10-CM | POA: Diagnosis not present

## 2023-04-25 DIAGNOSIS — M1712 Unilateral primary osteoarthritis, left knee: Secondary | ICD-10-CM | POA: Diagnosis not present

## 2023-05-02 DIAGNOSIS — M1712 Unilateral primary osteoarthritis, left knee: Secondary | ICD-10-CM | POA: Diagnosis not present

## 2023-05-05 DIAGNOSIS — K080 Exfoliation of teeth due to systemic causes: Secondary | ICD-10-CM | POA: Diagnosis not present

## 2023-05-06 ENCOUNTER — Other Ambulatory Visit: Payer: Self-pay | Admitting: Internal Medicine

## 2023-05-06 ENCOUNTER — Telehealth: Payer: Self-pay | Admitting: Internal Medicine

## 2023-05-06 NOTE — Telephone Encounter (Signed)
 Copied from CRM [id number]. Topic: Clinical - Medication Refill >> May 06, 2023  3:06 PM Eleanor C wrote: Most Recent Primary Care Visit:  Provider: GARALD KARLYNN GAILS  Department: [facility]  Visit Type: OFFICE VISIT  Date: 02/10/2023  Medication: amphetamine -dextroamphetamine (ADDERALL XR) 15 MG 24 hr capsule  Has the patient contacted their pharmacy? Yes (Agent: If no, request that the patient contact the pharmacy for the refill. If patient does not wish to contact the pharmacy document the reason why and proceed with request.) (Agent: If yes, when and what did the pharmacy advise?)  Is this the correct pharmacy for this prescription? Yes If no, delete pharmacy and type the correct one.  This is the patient's preferred pharmacy:  [id number] [place] - [address] AT CORNER OF [facility] [address] [place] [address] Phone: [phone] Fax: [phone]   Has the prescription been filled recently? No  Is the patient out of the medication? Yes NOTE- patient stated that she used to get 90 days supply and now has only been getting 30 day. She didn't know if she could go back to getting 90 day supply or if something changed.   Has the patient been seen for an appointment in the last year OR does the patient have an upcoming appointment? Yes  Can we respond through MyChart? Yes  Agent: Please be advised that Rx refills may take up to 3 business days. We ask that you follow-up with your pharmacy.

## 2023-05-08 MED ORDER — AMPHETAMINE-DEXTROAMPHET ER 15 MG PO CP24: 15.0000 mg | ORAL_CAPSULE | ORAL | 0 refills | Status: DC

## 2023-05-08 NOTE — Telephone Encounter (Signed)
Okay.  Schedule office visit every 3 months.  Thank you

## 2023-05-09 MED ORDER — AMPHETAMINE-DEXTROAMPHET ER 15 MG PO CP24: 15.0000 mg | ORAL_CAPSULE | ORAL | 0 refills | Status: DC

## 2023-05-25 ENCOUNTER — Other Ambulatory Visit: Payer: Self-pay | Admitting: Internal Medicine

## 2023-06-16 ENCOUNTER — Other Ambulatory Visit: Payer: Self-pay | Admitting: Internal Medicine

## 2023-06-16 DIAGNOSIS — R519 Headache, unspecified: Secondary | ICD-10-CM

## 2023-06-29 ENCOUNTER — Other Ambulatory Visit (HOSPITAL_COMMUNITY): Payer: Self-pay

## 2023-07-03 ENCOUNTER — Other Ambulatory Visit: Payer: Self-pay | Admitting: Internal Medicine

## 2023-07-13 ENCOUNTER — Other Ambulatory Visit: Payer: Self-pay | Admitting: Internal Medicine

## 2023-07-13 NOTE — Telephone Encounter (Signed)
 Copied from CRM [id number]. Topic: Clinical - Medication Refill >> Jul 13, 2023 12:56 PM Florestine Avers wrote: Most Recent Primary Care Visit:  Provider: Tresa Garter  Department: [facility]  Visit Type: OFFICE VISIT  Date: 02/10/2023  Medication: amphetamine-dextroamphetamine (ADDERALL XR) 15 MG 24 hr capsule  Has the patient contacted their pharmacy? Yes (Agent: If no, request that the patient contact the pharmacy for the refill. If patient does not wish to contact the pharmacy document the reason why and proceed with request.) (Agent: If yes, when and what did the pharmacy advise?)  Is this the correct pharmacy for this prescription? Yes If no, delete pharmacy and type the correct one.  This is the patient's preferred pharmacy:  [id number] [place], [place] - [address] AT CORNER OF [facility] [address] [place] [address] Phone: [phone] Fax: [phone]  [id number] [place], [place] - [address] AT [facility] [address] [place] [address] Phone: [phone] Fax: [phone]   Has the prescription been filled recently? No  Is the patient out of the medication? Yes  Has the patient been seen for an appointment in the last year OR does the patient have an upcoming appointment? Yes  Can we respond through MyChart? Yes  Agent: Please be advised that Rx refills may take up to 3 business days. We ask that you follow-up with your pharmacy.

## 2023-07-14 MED ORDER — AMPHETAMINE-DEXTROAMPHET ER 15 MG PO CP24: 15.0000 mg | ORAL_CAPSULE | ORAL | 0 refills | Status: DC

## 2023-07-18 ENCOUNTER — Other Ambulatory Visit: Payer: Self-pay | Admitting: Internal Medicine

## 2023-07-18 DIAGNOSIS — R519 Headache, unspecified: Secondary | ICD-10-CM

## 2023-07-20 ENCOUNTER — Ambulatory Visit: Admitting: Internal Medicine

## 2023-07-27 ENCOUNTER — Other Ambulatory Visit: Payer: Self-pay | Admitting: Internal Medicine

## 2023-07-28 ENCOUNTER — Ambulatory Visit (INDEPENDENT_AMBULATORY_CARE_PROVIDER_SITE_OTHER): Admitting: Internal Medicine

## 2023-07-28 ENCOUNTER — Encounter: Payer: Self-pay | Admitting: Internal Medicine

## 2023-07-28 VITALS — BP 122/74 | HR 63 | Temp 98.1°F | Ht 62.0 in | Wt 162.6 lb

## 2023-07-28 DIAGNOSIS — E66811 Obesity, class 1: Secondary | ICD-10-CM

## 2023-07-28 DIAGNOSIS — F32A Depression, unspecified: Secondary | ICD-10-CM | POA: Diagnosis not present

## 2023-07-28 DIAGNOSIS — E6609 Other obesity due to excess calories: Secondary | ICD-10-CM

## 2023-07-28 DIAGNOSIS — Z6830 Body mass index (BMI) 30.0-30.9, adult: Secondary | ICD-10-CM

## 2023-07-28 DIAGNOSIS — R519 Headache, unspecified: Secondary | ICD-10-CM

## 2023-07-28 DIAGNOSIS — F419 Anxiety disorder, unspecified: Secondary | ICD-10-CM | POA: Diagnosis not present

## 2023-07-28 DIAGNOSIS — R300 Dysuria: Secondary | ICD-10-CM

## 2023-07-28 LAB — URINALYSIS, ROUTINE W REFLEX MICROSCOPIC
Bilirubin Urine: NEGATIVE
Hgb urine dipstick: NEGATIVE
Ketones, ur: NEGATIVE
Nitrite: POSITIVE — AB
Specific Gravity, Urine: 1.01 (ref 1.000–1.030)
Total Protein, Urine: NEGATIVE
Urine Glucose: NEGATIVE
Urobilinogen, UA: 0.2 (ref 0.0–1.0)
pH: 6.5 (ref 5.0–8.0)

## 2023-07-28 MED ORDER — BUTALBITAL-APAP-CAFFEINE 50-325-40 MG PO TABS: 1.0000 | ORAL_TABLET | Freq: Three times a day (TID) | ORAL | 1 refills | Status: DC | PRN

## 2023-07-28 MED ORDER — ZEPBOUND 2.5 MG/0.5ML ~~LOC~~ SOAJ: 2.5000 mg | SUBCUTANEOUS | 3 refills | Status: DC

## 2023-07-28 MED ORDER — TRAMADOL HCL 50 MG PO TABS: 50.0000 mg | ORAL_TABLET | Freq: Four times a day (QID) | ORAL | 1 refills | Status: DC | PRN

## 2023-07-28 MED ORDER — ALPRAZOLAM 2 MG PO TABS: ORAL_TABLET | ORAL | 0 refills | Status: DC

## 2023-07-28 NOTE — Progress Notes (Signed)
 Subjective:  Patient ID: Julia Zimmerman, female    DOB: [birth year]  Age: 73 y.o. MRN: [id number]  CC: Medication Management (Medication Refill (tramadol and butalbital). Patient also wanting to know if they are due for lung cancer screening.), Urinary Tract Infection (Patient notes off intermittent UTI like symptoms (burning when urinating)), and Weight Management Screening (Patient wanting to know if mounjaro would be an option for them now)   HPI Julia Zimmerman presents for Medication Management (Medication Refill (tramadol and butalbital). Patient also wanting to know if they are due for lung cancer screening.), Urinary Tract Infection (Patient notes off intermittent UTI like symptoms (burning when urinating)), and Weight Management Screening (Patient wanting to know if mounjaro would be an option for them now)  Outpatient Medications Prior to Visit  Medication Sig Dispense Refill   alprazolam (XANAX) 2 MG tablet TAKE 1 TABLET BY MOUTH EVERY DAY AT BEDTIME AS NEEDED FOR INSOMNIA 90 tablet 1   aluminum-magnesium hydroxide 200-200 MG/5ML suspension Take 15 mLs by mouth every 6 (six) hours as needed for indigestion.     amphetamine-dextroamphetamine (ADDERALL XR) 15 MG 24 hr capsule Take 1 capsule by mouth every morning. Office visit every 3 months 30 capsule 0   amphetamine-dextroamphetamine (ADDERALL XR) 15 MG 24 hr capsule Take 1 capsule by mouth every morning. Schedule office visit every 3 months 30 capsule 0   B Complex-C-Folic Acid (B-COMPLEX/FOLIC ACID/VITAMIN C) TBCR TAKE 1 TABLET BY MOUTH EVERY DAY 125 tablet 3   butalbital-acetaminophen-caffeine (FIORICET) 50-325-40 MG tablet TAKE 1 TABLET BY MOUTH THREE TIMES A DAY AS NEEDED 90 tablet 0   diphenhydrAMINE (BENADRYL) 25 MG tablet Take 25 mg by mouth daily as needed for allergies.     famotidine (PEPCID) 40 MG tablet TAKE 1 TABLET(40 MG) BY MOUTH AT BEDTIME 90 tablet 3   losartan-hydrochlorothiazide (HYZAAR) 50-12.5 MG tablet TAKE 1  TABLET BY MOUTH EVERY DAY 90 tablet 3   pantoprazole (PROTONIX) 40 MG tablet TAKE 1 TABLET BY MOUTH EVERY DAY - NEED OFFICE VISIT 90 tablet 3   pravastatin (PRAVACHOL) 10 MG tablet TAKE 1 TABLET BY MOUTH EVERY DAY 90 tablet 3   RABEprazole (ACIPHEX) 20 MG tablet Take 1 tablet (20 mg total) by mouth daily. 30 tablet 11   rizatriptan (MAXALT) 10 MG tablet Take 1 tablet (10 mg total) by mouth once as needed for up to 1 dose for migraine. May repeat in 2 hours if needed 12 tablet 12   traMADol (ULTRAM) 50 MG tablet Take 1 tablet (50 mg total) by mouth every 6 (six) hours as needed for severe pain (pain score 7-10). 120 tablet 0   celecoxib (CELEBREX) 200 MG capsule Take 1 capsule (200 mg total) by mouth 2 (two) times daily. (Patient not taking: Reported on 07/28/2023) 180 capsule 1   sucralfate (CARAFATE) 1 GM/10ML suspension Take 10 mLs (1 g total) by mouth 4 (four) times daily -  with meals and at bedtime. (Patient not taking: Reported on 07/28/2023) 420 mL 3   No facility-administered medications prior to visit.    ROS: Review of Systems  Constitutional:  Negative for activity change, appetite change, chills, fatigue and unexpected weight change.  HENT:  Negative for congestion, mouth sores and sinus pressure.   Eyes:  Negative for visual disturbance.  Respiratory:  Negative for cough and chest tightness.   Gastrointestinal:  Negative for abdominal pain and nausea.  Genitourinary:  Negative for difficulty urinating, frequency and vaginal pain.  Musculoskeletal:  Positive  for arthralgias and back pain. Negative for gait problem.  Skin:  Negative for pallor and rash.  Neurological:  Negative for dizziness, tremors, weakness, numbness and headaches.  Psychiatric/Behavioral:  Negative for confusion, sleep disturbance and suicidal ideas.     Objective:  BP 122/74   Pulse 63   Temp 98.1 F (36.7 C)   Ht 5\' 2"  (1.575 m)   Wt 162 lb 9.6 oz (73.8 kg)   SpO2 95%   BMI 29.74 kg/m   BP Readings  from Last 3 Encounters:  07/28/23 122/74  02/10/23 120/70  10/21/22 110/80    Wt Readings from Last 3 Encounters:  07/28/23 162 lb 9.6 oz (73.8 kg)  02/10/23 165 lb (74.8 kg)  10/21/22 168 lb (76.2 kg)    Physical Exam Constitutional:      General: She is not in acute distress.    Appearance: She is well-developed. She is obese.  HENT:     Head: Normocephalic.     Right Ear: External ear normal.     Left Ear: External ear normal.     Nose: Nose normal.  Eyes:     General:        Right eye: No discharge.        Left eye: No discharge.     Conjunctiva/sclera: Conjunctivae normal.     Pupils: Pupils are equal, round, and reactive to light.  Neck:     Thyroid: No thyromegaly.     Vascular: No JVD.     Trachea: No tracheal deviation.  Cardiovascular:     Rate and Rhythm: Normal rate and regular rhythm.     Heart sounds: Normal heart sounds.  Pulmonary:     Effort: No respiratory distress.     Breath sounds: No stridor. No wheezing.  Abdominal:     General: Bowel sounds are normal. There is no distension.     Palpations: Abdomen is soft. There is no mass.     Tenderness: There is no abdominal tenderness. There is no guarding or rebound.  Musculoskeletal:        General: No tenderness.     Cervical back: Normal range of motion and neck supple. No rigidity.  Lymphadenopathy:     Cervical: No cervical adenopathy.  Skin:    Findings: No erythema or rash.  Neurological:     Cranial Nerves: No cranial nerve deficit.     Motor: No abnormal muscle tone.     Coordination: Coordination normal.     Deep Tendon Reflexes: Reflexes normal.  Psychiatric:        Behavior: Behavior normal.        Thought Content: Thought content normal.        Judgment: Judgment normal.     Lab Results  Component Value Date   WBC 7.5 02/10/2023   HGB 14.4 02/10/2023   HCT 43.4 02/10/2023   PLT 315.0 02/10/2023   GLUCOSE 107 (H) 02/10/2023   CHOL 258 (H) 04/22/2022   TRIG 321.0 (H)  04/22/2022   HDL 49.10 04/22/2022   LDLDIRECT 120.0 04/22/2022   LDLCALC 56 04/21/2021   ALT 26 02/10/2023   AST 24 02/10/2023   NA 138 02/10/2023   K 4.2 02/10/2023   CL 100 02/10/2023   CREATININE 0.69 02/10/2023   BUN 13 02/10/2023   CO2 32 02/10/2023   TSH 1.22 02/10/2023   INR 1.0 02/10/2023   HGBA1C 5.6 04/21/2021    US Abdomen Limited RUQ (LIVER/GB) Result Date: 04/30/2022 CLINICAL DATA:  Elevated alkaline phosphatase EXAM: ULTRASOUND ABDOMEN LIMITED RIGHT UPPER QUADRANT COMPARISON:  CT chest April 28, 2020 FINDINGS: Gallbladder: Surgically absent Common bile duct: Diameter: 11.5 mm distally Liver: Increased hepatic parenchymal echogenicity. No focal lesion. Portal vein is patent on color Doppler imaging with normal direction of blood flow towards the liver. Other: None. IMPRESSION: 1. Common bile duct is dilated measuring up to 11.5 mm. Recommend correlation with LFTs. If abnormal, further evaluation with MRCP or ERCP may be considered. 2. Increased hepatic parenchymal echogenicity suggestive of steatosis. Electronically Signed   By: Annia Belt M.D.   On: 04/30/2022 10:34    Assessment & Plan:   Problem List Items Addressed This Visit     Anxiety and depression   Chronic  Continue on Effexor Xanax prn      Relevant Medications   alprazolam (XANAX) 2 MG tablet   Obesity - Primary   BMI 29 and LBP/OA Zepbound Rx       Relevant Medications   tirzepatide (ZEPBOUND) 2.5 MG/0.5ML Pen   Headache   Relevant Medications   traMADol (ULTRAM) 50 MG tablet   butalbital-acetaminophen-caffeine (FIORICET) 50-325-40 MG tablet   Dysuria   Relevant Orders   Urinalysis      Meds ordered this encounter  Medications   traMADol (ULTRAM) 50 MG tablet    Sig: Take 1 tablet (50 mg total) by mouth every 6 (six) hours as needed for severe pain (pain score 7-10).    Dispense:  120 tablet    Refill:  1   alprazolam (XANAX) 2 MG tablet    Sig: TAKE 1 TABLET BY MOUTH THREE  TIMES A DAY AS NEEDED FOR ANXIETY    Dispense:  270 tablet    Refill:  0   butalbital-acetaminophen-caffeine (FIORICET) 50-325-40 MG tablet    Sig: Take 1 tablet by mouth 3 (three) times daily as needed.    Dispense:  90 tablet    Refill:  1   tirzepatide (ZEPBOUND) 2.5 MG/0.5ML Pen    Sig: Inject 2.5 mg into the skin once a week.    Dispense:  2 mL    Refill:  3      Follow-up: Return in about 3 months (around 10/28/2023) for a follow-up visit.  Sonda Primes, MD

## 2023-07-28 NOTE — Assessment & Plan Note (Signed)
 BMI 29 and LBP/OA Zepbound Rx

## 2023-07-28 NOTE — Assessment & Plan Note (Signed)
 Chronic  Continue on Effexor Xanax prn

## 2023-07-29 ENCOUNTER — Encounter: Payer: Self-pay | Admitting: Internal Medicine

## 2023-07-29 ENCOUNTER — Other Ambulatory Visit: Payer: Self-pay | Admitting: Internal Medicine

## 2023-07-29 MED ORDER — CEFUROXIME AXETIL 250 MG PO TABS
250.0000 mg | ORAL_TABLET | Freq: Two times a day (BID) | ORAL | 0 refills | Status: AC
Start: 2023-07-29 — End: ?

## 2023-08-01 ENCOUNTER — Encounter: Payer: Self-pay | Admitting: Internal Medicine

## 2023-08-01 NOTE — Telephone Encounter (Signed)
 Copied from CRM [id number]. Topic: General - Other >> Aug 01, 2023 12:10 PM Truddie Crumble wrote: Reason for CRM: patient called stating she has a yeast infection after taking antibiotics for a UTI. Patient want the doctor to call in a yeast medication. Patient thinks it is diflucan and want three tablets

## 2023-08-08 ENCOUNTER — Other Ambulatory Visit: Payer: Self-pay | Admitting: Internal Medicine

## 2023-08-08 MED ORDER — FLUCONAZOLE 150 MG PO TABS: 150.0000 mg | ORAL_TABLET | Freq: Once | ORAL | 1 refills | Status: AC

## 2023-08-11 ENCOUNTER — Other Ambulatory Visit (HOSPITAL_COMMUNITY): Payer: Self-pay

## 2023-08-11 ENCOUNTER — Telehealth: Payer: Self-pay | Admitting: Pharmacist

## 2023-08-11 NOTE — Telephone Encounter (Signed)
 Copied from CRM [id number]. Topic: General - Other >> Aug 11, 2023 10:20 AM Fredrich Romans wrote: Reason for CRM: [employer] called to notify provider that patient PA for zepbound coverage has been denied due to it wanting to be used for weight loss  will be sending over a detailed fax as well

## 2023-08-11 NOTE — Telephone Encounter (Signed)
 Pharmacy Patient Advocate Encounter   Received notification from Patient Pharmacy that prior authorization for Zepbound 2.5MG /0.5ML pen-injectors is required/requested.   Insurance verification completed.   The patient is insured through [facility] .   Per test claim: PA required; PA submitted to above mentioned insurance via CoverMyMeds Key/confirmation #/EOC [id number] Status is pending

## 2023-08-17 ENCOUNTER — Other Ambulatory Visit: Payer: Self-pay | Admitting: Internal Medicine

## 2023-08-17 NOTE — Telephone Encounter (Unsigned)
 Copied from CRM [id number]. Topic: Clinical - Medication Refill >> Aug 17, 2023 10:50 AM Ovid Blow wrote: Most Recent Primary Care Visit:  Provider: Genia Kettering  Department: [facility]  Visit Type: OFFICE VISIT  Date: 07/28/2023  Medication: amphetamine-dextroamphetamine (ADDERALL XR) 15 MG 24 hr capsule  Has the patient contacted their pharmacy? Yes (Agent: If no, request that the patient contact the pharmacy for the refill. If patient does not wish to contact the pharmacy document the reason why and proceed with request.) (Agent: If yes, when and what did the pharmacy advise?)  Is this the correct pharmacy for this prescription? Yes If no, delete pharmacy and type the correct one.  This is the patient's preferred pharmacy:  [id number] - [place], [place] - [address] AT [address] OF [facility] [address] [place] [address] Phone: [phone] Fax: [phone]   Has the prescription been filled recently? No  Is the patient out of the medication? Yes  Has the patient been seen for an appointment in the last year OR does the patient have an upcoming appointment? Yes  Can we respond through MyChart? Yes  Agent: Please be advised that Rx refills may take up to 3 business days. We ask that you follow-up with your pharmacy.

## 2023-08-18 ENCOUNTER — Other Ambulatory Visit: Payer: Self-pay | Admitting: Internal Medicine

## 2023-08-22 NOTE — Telephone Encounter (Signed)
 Called spoke with patient informing them of PA denial. They informed me they would be following up with their insurance company to check on alternatives

## 2023-08-23 MED ORDER — AMPHETAMINE-DEXTROAMPHET ER 15 MG PO CP24: 15.0000 mg | ORAL_CAPSULE | ORAL | 0 refills | Status: DC

## 2023-09-05 ENCOUNTER — Ambulatory Visit (INDEPENDENT_AMBULATORY_CARE_PROVIDER_SITE_OTHER)

## 2023-09-05 VITALS — Ht 62.0 in | Wt 162.0 lb

## 2023-09-05 DIAGNOSIS — Z0000 Encounter for general adult medical examination without abnormal findings: Secondary | ICD-10-CM | POA: Diagnosis not present

## 2023-09-05 NOTE — Patient Instructions (Signed)
 Julia Zimmerman , Thank you for taking time to come for your Medicare Wellness Visit. I appreciate your ongoing commitment to your health goals. Please review the following plan we discussed and let me know if I can assist you in the future.   Referrals/Orders/Follow-Ups/Clinician Recommendations: It was nice talking with you today.  You are due for a pneumonia vaccine.  Keep up the good work.  This is a list of the screening recommended for you and due dates:  Health Maintenance  Topic Date Due   Medicare Annual Wellness Visit  Never done   Pneumonia Vaccine (2 of 2 - PCV) 11/22/2013   COVID-19 Vaccine (4 - 2024-25 season) 01/02/2023   Flu Shot  12/02/2023   Mammogram  01/19/2025   Colon Cancer Screening  05/17/2026   DTaP/Tdap/Td vaccine (4 - Td or Tdap) 09/24/2029   DEXA scan (bone density measurement)  Completed   Hepatitis C Screening  Completed   HPV Vaccine  Aged Out   Meningitis B Vaccine  Aged Out   Zoster (Shingles) Vaccine  Discontinued    Advanced directives: (In Chart) A copy of your advanced directives are scanned into your chart should your provider ever need it.  Next Medicare Annual Wellness Visit scheduled for next year: Yes  Have you seen your provider in the last 6 months (3 months if uncontrolled diabetes)? Yes

## 2023-09-05 NOTE — Progress Notes (Cosign Needed Addendum)
 Subjective:   Julia Zimmerman is a 73 y.o. who presents for a Medicare Wellness preventive visit.  Visit Complete: Virtual I connected with  Marcelline Sermons on 09/05/23 by a audio enabled telemedicine application and verified that I am speaking with the correct person using two identifiers.  Patient Location: Home  Provider Location: Home Office  I discussed the limitations of evaluation and management by telemedicine. The patient expressed understanding and agreed to proceed.  Vital Signs: Because this visit was a virtual/telehealth visit, some criteria may be missing or patient reported. Any vitals not documented were not able to be obtained and vitals that have been documented are patient reported.  VideoDeclined- This patient declined Librarian, academic. Therefore the visit was completed with audio only.  Persons Participating in Visit: Patient.  AWV Questionnaire: Yes: Patient Medicare AWV questionnaire was completed by the patient on 09/02/2023; I have confirmed that all information answered by patient is correct and no changes since this date.  Cardiac Risk Factors include: advanced age (>74men, >23 women);hypertension;Other (see comment), Risk factor comments: Coronary atherosclerosis     Objective:    Today's Vitals   09/05/23 0917  Weight: 162 lb (73.5 kg)  Height: 5\' 2"  (1.575 m)   Body mass index is 29.63 kg/m.     09/05/2023    9:39 AM 04/24/2018   10:42 AM 04/14/2018    9:08 AM 11/19/2016    1:32 PM 11/19/2016   10:01 AM 06/19/2015    2:22 PM  Advanced Directives  Does Patient Have a Medical Advance Directive? Yes No No No No No  Type of Estate agent of [place];Living will       Does patient want to make changes to medical advance directive? No - Patient declined       Copy of Healthcare Power of Attorney in Chart? Yes - validated most recent copy scanned in chart (See row information)       Would patient  like information on creating a medical advance directive?  Yes (Inpatient - patient defers creating a medical advance directive at this time) Yes (Inpatient - patient defers creating a medical advance directive at this time) No - Patient declined  No - patient declined information    Current Medications (verified) Outpatient Encounter Medications as of 09/05/2023  Medication Sig   alprazolam  (XANAX ) 2 MG tablet TAKE 1 TABLET BY MOUTH THREE TIMES A DAY AS NEEDED FOR ANXIETY   aluminum-magnesium  hydroxide 200-200 MG/5ML suspension Take 15 mLs by mouth every 6 (six) hours as needed for indigestion.   amphetamine -dextroamphetamine (ADDERALL XR) 15 MG 24 hr capsule Take 1 capsule by mouth every morning. Office visit every 3 months   amphetamine -dextroamphetamine (ADDERALL XR) 15 MG 24 hr capsule Take 1 capsule by mouth every morning. Schedule office visit every 3 months   B Complex-C-Folic Acid (B-COMPLEX/FOLIC ACID/VITAMIN C) TBCR TAKE 1 TABLET BY MOUTH EVERY DAY   butalbital -acetaminophen -caffeine  (FIORICET) 50-325-40 MG tablet TAKE 1 TABLET BY MOUTH THREE TIMES A DAY AS NEEDED   butalbital -acetaminophen -caffeine  (FIORICET) 50-325-40 MG tablet Take 1 tablet by mouth 3 (three) times daily as needed.   cefUROXime  (CEFTIN ) 250 MG tablet Take 1 tablet (250 mg total) by mouth 2 (two) times daily with a meal.   celecoxib  (CELEBREX ) 200 MG capsule Take 1 capsule (200 mg total) by mouth 2 (two) times daily.   diphenhydrAMINE  (BENADRYL ) 25 MG tablet Take 25 mg by mouth daily as needed for allergies.  famotidine  (PEPCID ) 40 MG tablet TAKE 1 TABLET(40 MG) BY MOUTH AT BEDTIME   losartan -hydrochlorothiazide  (HYZAAR) 50-12.5 MG tablet TAKE 1 TABLET BY MOUTH EVERY DAY   pantoprazole  (PROTONIX ) 40 MG tablet TAKE 1 TABLET BY MOUTH EVERY DAY - NEED OFFICE VISIT   pravastatin  (PRAVACHOL ) 10 MG tablet TAKE 1 TABLET BY MOUTH EVERY DAY   RABEprazole  (ACIPHEX ) 20 MG tablet Take 1 tablet (20 mg total) by mouth daily.    rizatriptan  (MAXALT ) 10 MG tablet Take 1 tablet (10 mg total) by mouth once as needed for up to 1 dose for migraine. May repeat in 2 hours if needed   sucralfate  (CARAFATE ) 1 GM/10ML suspension Take 10 mLs (1 g total) by mouth 4 (four) times daily -  with meals and at bedtime.   tirzepatide  (ZEPBOUND ) 2.5 MG/0.5ML Pen Inject 2.5 mg into the skin once a week.   traMADol  (ULTRAM ) 50 MG tablet Take 1 tablet (50 mg total) by mouth every 6 (six) hours as needed for severe pain (pain score 7-10).   alprazolam  (XANAX ) 2 MG tablet TAKE 1 TABLET BY MOUTH EVERY DAY AT BEDTIME AS NEEDED FOR INSOMNIA   No facility-administered encounter medications on file as of 09/05/2023.    Allergies (verified) Sulfa antibiotics, Crestor  [rosuvastatin ], Statins, Sulfur dioxide, Codeine, and Hydrocodone    History: Past Medical History:  Diagnosis Date   ADD 10/30/2008   Qualifier: Diagnosis of  By: Kingsley Penny MD, Willie R    Allergy    Anxiety    ANXIETY DEPRESSION 03/29/2007   Qualifier: Diagnosis of  By: Kingsley Penny MD, Darlene Ehlers    Cataract    Complication of anesthesia    Cough 04/21/2010   full eval including allergist. Working diagnosis - reflux.   Depression    Endometriosis    Episodic tension type headache 11/07/2007   Qualifier: Diagnosis of  By: Kingsley Penny MD, Darlene Ehlers    Essential hypertension, benign 05/15/2010   Qualifier: Diagnosis of  By: Nasario Badder MD, Sallyanne Creamer    GERD 04/21/2010   Qualifier: Diagnosis of  By: Kingsley Penny MD, Darlene Ehlers    GERD (gastroesophageal reflux disease)    Hyperlipidemia    HYPERLIPIDEMIA 05/15/2010   Qualifier: Diagnosis of  By: Nasario Badder MD, Sallyanne Creamer    Learning disability    poor attention span and difficulty with retention   Lumbosacral facet joint syndrome 04/11/2018   ONYCHOMYCOSIS 10/30/2008   successfully treated with lamisil.   Osteopenia    PONV (postoperative nausea and vomiting)    Primary localized osteoarthritis of right knee 04/11/2018   RHINITIS  04/21/2010   Qualifier: Diagnosis of  By: Kingsley Penny MD, Darlene Ehlers    Past Surgical History:  Procedure Laterality Date   CHOLECYSTECTOMY N/A 11/20/2016   Procedure: LAPAROSCOPIC CHOLECYSTECTOMY WITH INTRAOPERATIVE CHOLANGIOGRAM;  Surgeon: Joyce Nixon, MD;  Location: [facility];  Service: General;  Laterality: N/A;   COLONOSCOPY     G0P0     LAPAROSCOPY  1994   x 2 for endometriosis   LAPAROTOMY  1989   for benign tumor   MENISCUS REPAIR Left    NECK MASS EXCISION  1974   benign mass left   TOTAL HIP ARTHROPLASTY Right 04/2021   TOTAL KNEE ARTHROPLASTY Right 04/24/2018   Procedure: TOTAL KNEE ARTHROPLASTY;  Surgeon: Elly Habermann, MD;  Location: [facility];  Service: Orthopedics;  Laterality: Right;   Family History  Problem Relation Age of Onset   Hypertension Mother    Diabetes Mother  Arthritis Father        gout   Heart disease Sister        palpitations   Cancer Neg Hx    COPD Neg Hx    Colon cancer Neg Hx    Esophageal cancer Neg Hx    Stomach cancer Neg Hx    Rectal cancer Neg Hx    Social History   Socioeconomic History   Marital status: Widowed    Spouse name: Not on file   Number of children: 0   Years of education: 14   Highest education level: Associate degree: academic program  Occupational History   Occupation: RETIRED/CUSTOMER SERVICE    Employer: [employer]  Tobacco Use   Smoking status: Former    Current packs/day: 0.00    Average packs/day: 1.5 packs/day for 19.0 years (28.5 ttl pk-yrs)    Types: Cigarettes    Start date: 10/27/1971    Quit date: 10/27/1990    Years since quitting: 32.8   Smokeless tobacco: Never  Vaping Use   Vaping status: Never Used  Substance and Sexual Activity   Alcohol use: No   Drug use: No   Sexual activity: Not Currently  Other Topics Concern   Not on file  Social History Narrative   HSG, 2 years [employer] in [place]. Married 1975- 34 yrs/widowed. No children. 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 Lives alone with her dogs (resucues -7), cats (3). No abuse history.    Social Drivers of Corporate investment banker Strain: Low Risk  (09/02/2023)   Overall Financial Resource Strain (CARDIA)    Difficulty of Paying Living Expenses: Not very hard  Food Insecurity: No Food Insecurity (09/02/2023)   Hunger Vital Sign    Worried About Running Out of Food in the Last Year: Never true    Ran Out of Food in the Last Year: Never true  Transportation Needs: No Transportation Needs (09/02/2023)   PRAPARE - Administrator, Civil Service (Medical): No    Lack of Transportation (Non-Medical): No  Physical Activity: Insufficiently Active (09/02/2023)   Exercise Vital Sign    Days of Exercise per Week: 1 day    Minutes of Exercise per Session: 30 min  Stress: No Stress Concern Present (09/02/2023)   [employer] of Occupational Health - Occupational Stress Questionnaire    Feeling of Stress : Only a little  Social Connections: Socially Isolated (09/02/2023)   Social Connection and Isolation Panel [NHANES]    Frequency of Communication with Friends and Family: More than three times a week    Frequency of Social Gatherings with Friends and Family: Once a week    Attends Religious Services: Never    Database administrator or Organizations: No    Attends Engineer, structural: Not on file    Marital Status: Widowed    Tobacco Counseling Counseling given: Not Answered    Clinical Intake:  Pre-visit preparation completed: Yes  Pain : No/denies pain     BMI - recorded: 29.63 Nutritional Status: BMI 25 -29 Overweight Nutritional Risks: None Diabetes: No  Lab Results  Component Value Date   HGBA1C 5.6 04/21/2021     How often do you need to have someone help you when you read instructions, pamphlets, or other written materials from your doctor or pharmacy?: 1 - Never  Interpreter Needed?: No  Information entered by :: Rohaan Durnil, RMA  Activities of Daily Living     09/02/2023    5:25 PM  In your present state of health, do you have any difficulty performing the following activities:  Hearing? 0  Vision? 0  Difficulty concentrating or making decisions? 0  Walking or climbing stairs? 0  Dressing or bathing? 0  Doing errands, shopping? 0  Preparing Food and eating ? N  Using the Toilet? N  In the past six months, have you accidently leaked urine? N  Do you have problems with loss of bowel control? N  Managing your Medications? N  Managing your Finances? N  Housekeeping or managing your Housekeeping? N    Patient Care Team: Plotnikov, Oakley Bellman, MD as PCP - General (Internal Medicine) Elly Habermann, MD as Consulting Physician (Orthopedic Surgery) Hazle Lites, MD as Consulting Physician (Cardiology) Asencion Blacksmith, MD (Inactive) as Consulting Physician (Gastroenterology) Liliane Rei, MD as Consulting Physician (Orthopedic Surgery) Elmyra Haggard, MD as Consulting Physician (Cardiology)  Indicate any recent Medical Services you may have received from other than [facility] providers in the past year (date may be approximate).     Assessment:   This is a routine wellness examination for [place].  Hearing/Vision screen Hearing Screening - Comments:: Denies hearing difficulties   Vision Screening - Comments:: Denies vision issues.    Goals Addressed               This Visit's Progress     Patient Stated (pt-stated)        To stay active       Depression Screen NO CHANGES FROM 07/28/23-PER PT    07/28/2023    3:34 PM 02/10/2023    2:06 PM 05/17/2022    9:36 AM 04/22/2022    3:58 PM 10/22/2020    4:11 PM  PHQ 2/9 Scores  PHQ - 2 Score 0 0 0 0 0  PHQ- 9 Score 5  0 0 2    Fall Risk     09/02/2023    5:25 PM 07/28/2023    3:33 PM 02/10/2023    2:06 PM 05/17/2022    9:36 AM 04/22/2022    3:57 PM  Fall Risk   Falls in the past year? 0 0 0 0 0  Number falls in past yr: 0 0 0 0 0   Injury with Fall?  0 0 0 0  Risk for fall due to : Medication side effect No Fall Risks No Fall Risks No Fall Risks No Fall Risks  Follow up Falls evaluation completed;Falls prevention discussed Falls evaluation completed Falls evaluation completed Falls evaluation completed Falls evaluation completed    MEDICARE RISK AT HOME:  Medicare Risk at Home Any stairs in or around the home?: (Patient-Rptd) Yes If so, are there any without handrails?: (Patient-Rptd) No Home free of loose throw rugs in walkways, pet beds, electrical cords, etc?: (Patient-Rptd) Yes Adequate lighting in your home to reduce risk of falls?: (Patient-Rptd) Yes Life alert?: (Patient-Rptd) No Use of a cane, walker or w/c?: (Patient-Rptd) No Grab bars in the bathroom?: (Patient-Rptd) No Shower chair or bench in shower?: (Patient-Rptd) Yes Elevated toilet seat or a handicapped toilet?: (Patient-Rptd) No  TIMED UP AND GO:  Was the test performed?  No  Cognitive Function: Declined/Normal: No cognitive concerns noted by patient or family. Patient alert, oriented, able to answer questions appropriately and recall recent events. No signs of memory loss or confusion.        Immunizations Immunization History  Administered Date(s)  Administered   Fluad Quad(high Dose 65+) 04/24/2020, 03/06/2022   Influenza Whole 03/29/2007, 05/16/2009   Influenza, High Dose Seasonal PF 02/11/2019, 01/16/2021, 04/07/2021   Influenza,inj,Quad PF,6+ Mos 01/02/2014, 03/12/2016, 03/16/2017   Influenza-Unspecified 02/02/2018   PFIZER(Purple Top)SARS-COV-2 Vaccination 11/22/2019, 12/13/2019   Pfizer Covid-19 Vaccine Bivalent Booster 59yrs & up 04/07/2021   Pneumococcal Polysaccharide-23 11/22/2012   Td 05/03/1998, 10/30/2008   Tdap 09/25/2019   Zoster, Live 01/15/2015    Screening Tests Health Maintenance  Topic Date Due   Medicare Annual Wellness (AWV)  Never done   Pneumonia Vaccine 5+ Years old (2 of 2 - PCV) 11/22/2013   COVID-19  Vaccine (4 - 2024-25 season) 01/02/2023   INFLUENZA VACCINE  12/02/2023   MAMMOGRAM  01/19/2025   Colonoscopy  05/17/2026   DTaP/Tdap/Td (4 - Td or Tdap) 09/24/2029   DEXA SCAN  Completed   Hepatitis C Screening  Completed   HPV VACCINES  Aged Out   Meningococcal B Vaccine  Aged Out   Zoster Vaccines- Shingrix  Discontinued    Health Maintenance  Health Maintenance Due  Topic Date Due   Medicare Annual Wellness (AWV)  Never done   Pneumonia Vaccine 62+ Years old (2 of 2 - PCV) 11/22/2013   COVID-19 Vaccine (4 - 2024-25 season) 01/02/2023   Health Maintenance Items Addressed: See Nurse Notes  Additional Screening:  Vision Screening: Recommended annual ophthalmology exams for early detection of glaucoma and other disorders of the eye.  Dental Screening: Recommended annual dental exams for proper oral hygiene  Community Resource Referral / Chronic Care Management: CRR required this visit?  No   CCM required this visit?  No     Plan:     I have personally reviewed and noted the following in the patient's chart:   Medical and social history Use of alcohol, tobacco or illicit drugs  Current medications and supplements including opioid prescriptions. Patient is currently taking opioid prescriptions. Information provided to patient regarding non-opioid alternatives. Patient advised to discuss non-opioid treatment plan with their provider. Functional ability and status Nutritional status Physical activity Advanced directives List of other physicians Hospitalizations, surgeries, and ER visits in previous 12 months Vitals Screenings to include cognitive, depression, and falls Referrals and appointments  In addition, I have reviewed and discussed with patient certain preventive protocols, quality metrics, and best practice recommendations. A written personalized care plan for preventive services as well as general preventive health recommendations were provided to patient.      Evelina Lore L Nasim Habeeb, CMA   09/05/2023   After Visit Summary: (MyChart) Due to this being a telephonic visit, the after visit summary with patients personalized plan was offered to patient via MyChart   Notes: Please refer to Routing Comments.  Medical screening examination/treatment/procedure(s) were performed by non-physician practitioner and as supervising physician I was immediately available for consultation/collaboration.  I agree with above. Adelaide Holy, MD

## 2023-09-20 ENCOUNTER — Other Ambulatory Visit: Payer: Self-pay | Admitting: Internal Medicine

## 2023-09-20 NOTE — Telephone Encounter (Signed)
 Copied from CRM [id number]. Topic: Clinical - Medication Refill >> Sep 20, 2023 12:06 PM Peg Bouton F wrote: Medication: amphetamine -dextroamphetamine (ADDERALL XR) 15 MG 24 hr capsule. PATIENT REQUESTED 90-DAY SUPPLY  Has the patient contacted their pharmacy? Yes (Agent: If no, request that the patient contact the pharmacy for the refill. If patient does not wish to contact the pharmacy document the reason why and proceed with request.) (Agent: If yes, when and what did the pharmacy advise?)  This is the patient's preferred pharmacy:  [id number] - [place], [place] - [address] AT [address] OF [name] [address] [place] [address] Phone: [phone] Fax: [phone]  Phone: [phone] Fax: [phone]  Is this the correct pharmacy for this prescription? Yes If no, delete pharmacy and type the correct one.   Has the prescription been filled recently? No  Is the patient out of the medication? Yes, today was the last pill  Has the patient been seen for an appointment in the last year OR does the patient have an upcoming appointment? Yes  Can we respond through MyChart? Yes  Agent: Please be advised that Rx refills may take up to 3 business days. We ask that you follow-up with your pharmacy.

## 2023-09-21 ENCOUNTER — Other Ambulatory Visit: Payer: Self-pay | Admitting: Internal Medicine

## 2023-09-21 DIAGNOSIS — R519 Headache, unspecified: Secondary | ICD-10-CM

## 2023-09-22 MED ORDER — AMPHETAMINE-DEXTROAMPHET ER 15 MG PO CP24
15.0000 mg | ORAL_CAPSULE | ORAL | 0 refills | Status: DC
Start: 2023-09-22 — End: 2023-12-16

## 2023-10-09 ENCOUNTER — Other Ambulatory Visit: Payer: Self-pay | Admitting: Internal Medicine

## 2023-11-03 ENCOUNTER — Other Ambulatory Visit: Payer: Self-pay | Admitting: Internal Medicine

## 2023-11-03 DIAGNOSIS — K080 Exfoliation of teeth due to systemic causes: Secondary | ICD-10-CM | POA: Diagnosis not present

## 2023-11-03 NOTE — Telephone Encounter (Signed)
 09/22/23 30 tabs/0 RF

## 2023-11-03 NOTE — Telephone Encounter (Signed)
 Copied from CRM [id number]. Topic: Clinical - Medication Refill >> Nov 03, 2023 10:06 AM Rosina BIRCH wrote: Medication: ADDERALL XR  Has the patient contacted their pharmacy? Yes (Agent: If no, request that the patient contact the pharmacy for the refill. If patient does not wish to contact the pharmacy document the reason why and proceed with request.) (Agent: If yes, when and what did the pharmacy advise?)  This is the patient's preferred pharmacy:  [id number] - [place], [place] - [address] AT [address] OF [facility] [address] [place] [address] Phone: [phone] Fax: [phone]  Is this the correct pharmacy for this prescription? Yes If no, delete pharmacy and type the correct one.   Has the prescription been filled recently? Yes  Is the patient out of the medication? Yes  Has the patient been seen for an appointment in the last year OR does the patient have an upcoming appointment? Yes  Can we respond through MyChart? Yes  Agent: Please be advised that Rx refills may take up to 3 business days. We ask that you follow-up with your pharmacy.

## 2023-11-07 MED ORDER — AMPHETAMINE-DEXTROAMPHET ER 15 MG PO CP24: 15.0000 mg | ORAL_CAPSULE | ORAL | 0 refills | Status: DC

## 2023-11-15 ENCOUNTER — Other Ambulatory Visit: Payer: Self-pay | Admitting: Internal Medicine

## 2023-11-15 DIAGNOSIS — R519 Headache, unspecified: Secondary | ICD-10-CM

## 2023-11-23 ENCOUNTER — Other Ambulatory Visit: Payer: Self-pay | Admitting: Internal Medicine

## 2023-12-16 ENCOUNTER — Other Ambulatory Visit: Payer: Self-pay | Admitting: Internal Medicine

## 2023-12-16 NOTE — Telephone Encounter (Signed)
 FYI Only or Action Required?: Action required by provider: medication refill request.  Patient was last seen in primary care on 07/28/2023 by Plotnikov, Karlynn GAILS, MD.  Called Nurse Triage reporting No chief complaint on file..  Symptoms began today.  Interventions attempted: Nothing.  Symptoms are: stable.  Triage Disposition: No disposition on file.  Patient/caregiver understands and will follow disposition?:

## 2023-12-16 NOTE — Telephone Encounter (Signed)
 Copied from CRM #[id number]. Topic: Clinical - Medication Refill >> Dec 16, 2023  3:06 PM [place] J wrote: Medication: amphetamine -dextroamphetamine (ADDERALL XR) 15 MG 24 hr capsule  Has the patient contacted their pharmacy? Yes (Agent: If no, request that the patient contact the pharmacy for the refill. If patient does not wish to contact the pharmacy document the reason why and proceed with request.) (Agent: If yes, when and what did the pharmacy advise?) Since medication is a controlled substance, pharmacy advised her to call primary care.  This is the patient's preferred pharmacy:  [id number] - [place], [place] - [address] AT [address] OF [facility] [address] [place] [address] Phone: [phone] Fax: [phone]  Is this the correct pharmacy for this prescription? Yes If no, delete pharmacy and type the correct one.   Has the prescription been filled recently? No  Is the patient out of the medication? Yes  Has the patient been seen for an appointment in the last year OR does the patient have an upcoming appointment? Yes  Can we respond through MyChart? No  Agent: Please be advised that Rx refills may take up to 3 business days. We ask that you follow-up with your pharmacy.

## 2023-12-18 MED ORDER — AMPHETAMINE-DEXTROAMPHET ER 15 MG PO CP24: 15.0000 mg | ORAL_CAPSULE | ORAL | 0 refills | Status: DC

## 2023-12-21 ENCOUNTER — Ambulatory Visit: Admitting: Internal Medicine

## 2023-12-29 DIAGNOSIS — M1712 Unilateral primary osteoarthritis, left knee: Secondary | ICD-10-CM | POA: Diagnosis not present

## 2024-01-05 DIAGNOSIS — M1712 Unilateral primary osteoarthritis, left knee: Secondary | ICD-10-CM | POA: Diagnosis not present

## 2024-01-12 ENCOUNTER — Encounter: Payer: Self-pay | Admitting: Internal Medicine

## 2024-01-12 ENCOUNTER — Ambulatory Visit: Admitting: Internal Medicine

## 2024-01-12 VITALS — BP 112/70 | HR 92 | Temp 98.1°F | Ht 62.0 in | Wt 166.2 lb

## 2024-01-12 DIAGNOSIS — R519 Headache, unspecified: Secondary | ICD-10-CM

## 2024-01-12 DIAGNOSIS — I2583 Coronary atherosclerosis due to lipid rich plaque: Secondary | ICD-10-CM

## 2024-01-12 DIAGNOSIS — F902 Attention-deficit hyperactivity disorder, combined type: Secondary | ICD-10-CM

## 2024-01-12 DIAGNOSIS — M1712 Unilateral primary osteoarthritis, left knee: Secondary | ICD-10-CM | POA: Diagnosis not present

## 2024-01-12 DIAGNOSIS — F419 Anxiety disorder, unspecified: Secondary | ICD-10-CM

## 2024-01-12 DIAGNOSIS — F32A Depression, unspecified: Secondary | ICD-10-CM

## 2024-01-12 DIAGNOSIS — E785 Hyperlipidemia, unspecified: Secondary | ICD-10-CM

## 2024-01-12 MED ORDER — AMPHETAMINE-DEXTROAMPHET ER 15 MG PO CP24: 15.0000 mg | ORAL_CAPSULE | Freq: Every morning | ORAL | 0 refills | Status: DC

## 2024-01-12 MED ORDER — ZEPBOUND 2.5 MG/0.5ML ~~LOC~~ SOLN: 2.5000 mg | SUBCUTANEOUS | 3 refills | Status: AC

## 2024-01-12 MED ORDER — BUTALBITAL-APAP-CAFFEINE 50-325-40 MG PO TABS
1.0000 | ORAL_TABLET | Freq: Three times a day (TID) | ORAL | 0 refills | Status: DC | PRN
Start: 2024-01-12 — End: 2024-02-10

## 2024-01-12 MED ORDER — AMPHETAMINE-DEXTROAMPHET ER 15 MG PO CP24: 15.0000 mg | ORAL_CAPSULE | ORAL | 0 refills | Status: DC

## 2024-01-12 NOTE — Assessment & Plan Note (Signed)
 On Pravachol  since 2023

## 2024-01-12 NOTE — Assessment & Plan Note (Signed)
On Crestor - cramps; had to stop Started on pravastatin if tolerated

## 2024-01-12 NOTE — Assessment & Plan Note (Signed)
 ADD - on 15 mg Adderall XR  Rx given

## 2024-01-12 NOTE — Assessment & Plan Note (Signed)
  Cont w/Fioricet - rare use  Potential benefits of a long term Fioricet use as well as potential risks  and complications were explained to the patient and were aknowledged.

## 2024-01-12 NOTE — Assessment & Plan Note (Signed)
 Chronic  Continue on Effexor  Xanax  prn  Potential benefits of a long term benzodiazepines  use as well as potential risks  and complications were explained to the patient and were aknowledged.

## 2024-01-12 NOTE — Progress Notes (Signed)
 Subjective:  Patient ID: Julia Zimmerman, female    DOB: February 09, 1951  Age: 73 y.o. MRN: [id number]  CC: Follow-up (Patient states follow up for medications. Patient also says she has bad cough that comes and goes. States spot on lungs and she usually goes for a scan and is wondering about another one. )   HPI Julia Zimmerman presents for anxiety, depression, ADD, GERD  Outpatient Medications Prior to Visit  Medication Sig Dispense Refill   alprazolam  (XANAX ) 2 MG tablet TAKE 1 TABLET BY MOUTH 3 TIMES A DAY AS NEEDED FOR ANXIETY 270 tablet 0   B Complex-C-Folic Acid (B-COMPLEX/FOLIC ACID/VITAMIN C) TBCR TAKE 1 TABLET BY MOUTH EVERY DAY 125 tablet 3   butalbital -acetaminophen -caffeine  (FIORICET) 50-325-40 MG tablet TAKE 1 TABLET BY MOUTH THREE TIMES A DAY AS NEEDED 90 tablet 1   celecoxib  (CELEBREX ) 200 MG capsule Take 1 capsule (200 mg total) by mouth 2 (two) times daily. 180 capsule 1   diphenhydrAMINE  (BENADRYL ) 25 MG tablet Take 25 mg by mouth daily as needed for allergies.     famotidine  (PEPCID ) 40 MG tablet TAKE 1 TABLET(40 MG) BY MOUTH AT BEDTIME 90 tablet 3   losartan -hydrochlorothiazide  (HYZAAR) 50-12.5 MG tablet TAKE 1 TABLET BY MOUTH EVERY DAY 90 tablet 3   pravastatin  (PRAVACHOL ) 10 MG tablet TAKE 1 TABLET BY MOUTH EVERY DAY 90 tablet 3   RABEprazole  (ACIPHEX ) 20 MG tablet Take 1 tablet (20 mg total) by mouth daily. 30 tablet 11   traMADol  (ULTRAM ) 50 MG tablet Take 1 tablet (50 mg total) by mouth every 6 (six) hours as needed for severe pain (pain score 7-10). 120 tablet 1   amphetamine -dextroamphetamine (ADDERALL XR) 15 MG 24 hr capsule Take 1 capsule by mouth every morning. Schedule office visit every 3 months 30 capsule 0   amphetamine -dextroamphetamine (ADDERALL XR) 15 MG 24 hr capsule Take 1 capsule by mouth every morning. Office visit every 3 months 30 capsule 0   alprazolam  (XANAX ) 2 MG tablet TAKE 1 TABLET BY MOUTH EVERY DAY AT BEDTIME AS NEEDED FOR INSOMNIA 90 tablet 1    alprazolam  (XANAX ) 2 MG tablet TAKE 1 TABLET BY MOUTH THREE TIMES A DAY AS NEEDED FOR ANXIETY (Patient not taking: Reported on 01/12/2024) 270 tablet 0   aluminum-magnesium  hydroxide 200-200 MG/5ML suspension Take 15 mLs by mouth every 6 (six) hours as needed for indigestion. (Patient not taking: Reported on 01/12/2024)     cefUROXime  (CEFTIN ) 250 MG tablet Take 1 tablet (250 mg total) by mouth 2 (two) times daily with a meal. 8 tablet 0   pantoprazole  (PROTONIX ) 40 MG tablet TAKE 1 TABLET BY MOUTH EVERY DAY - NEED OFFICE VISIT (Patient not taking: Reported on 01/12/2024) 90 tablet 3   rizatriptan  (MAXALT ) 10 MG tablet Take 1 tablet (10 mg total) by mouth once as needed for up to 1 dose for migraine. May repeat in 2 hours if needed 12 tablet 12   sucralfate  (CARAFATE ) 1 GM/10ML suspension Take 10 mLs (1 g total) by mouth 4 (four) times daily -  with meals and at bedtime. (Patient not taking: Reported on 01/12/2024) 420 mL 3   butalbital -acetaminophen -caffeine  (FIORICET) 50-325-40 MG tablet TAKE 1 TABLET BY MOUTH THREE TIMES A DAY AS NEEDED (Patient not taking: Reported on 01/12/2024) 90 tablet 0   tirzepatide  (ZEPBOUND ) 2.5 MG/0.5ML Pen Inject 2.5 mg into the skin once a week. (Patient not taking: Reported on 01/12/2024) 2 mL 3   No facility-administered medications prior to visit.  ROS: Review of Systems  Constitutional:  Positive for unexpected weight change. Negative for activity change, appetite change, chills and fatigue.  HENT:  Negative for congestion, mouth sores and sinus pressure.   Eyes:  Negative for visual disturbance.  Respiratory:  Negative for cough, chest tightness and shortness of breath.   Gastrointestinal:  Negative for abdominal pain and nausea.  Genitourinary:  Negative for difficulty urinating, frequency and vaginal pain.  Musculoskeletal:  Negative for back pain and gait problem.  Skin:  Negative for pallor and rash.  Neurological:  Negative for dizziness, tremors,  weakness, numbness and headaches.  Psychiatric/Behavioral:  Negative for confusion, sleep disturbance and suicidal ideas.     Objective:  BP 112/70   Pulse 92   Temp 98.1 F (36.7 C) (Oral)   Ht 5' 2 (1.575 m)   Wt 166 lb 3.2 oz (75.4 kg)   SpO2 94%   BMI 30.40 kg/m   BP Readings from Last 3 Encounters:  01/12/24 112/70  07/28/23 122/74  02/10/23 120/70    Wt Readings from Last 3 Encounters:  01/12/24 166 lb 3.2 oz (75.4 kg)  09/05/23 162 lb (73.5 kg)  07/28/23 162 lb 9.6 oz (73.8 kg)    Physical Exam Constitutional:      General: She is not in acute distress.    Appearance: She is well-developed.  HENT:     Head: Normocephalic.     Right Ear: External ear normal.     Left Ear: External ear normal.     Nose: Nose normal.  Eyes:     General:        Right eye: No discharge.        Left eye: No discharge.     Conjunctiva/sclera: Conjunctivae normal.     Pupils: Pupils are equal, round, and reactive to light.  Neck:     Thyroid : No thyromegaly.     Vascular: No JVD.     Trachea: No tracheal deviation.  Cardiovascular:     Rate and Rhythm: Normal rate and regular rhythm.     Heart sounds: Normal heart sounds.  Pulmonary:     Effort: No respiratory distress.     Breath sounds: No stridor. No wheezing.  Abdominal:     General: Bowel sounds are normal. There is no distension.     Palpations: Abdomen is soft. There is no mass.     Tenderness: There is no abdominal tenderness. There is no guarding or rebound.  Musculoskeletal:        General: No tenderness.     Cervical back: Normal range of motion and neck supple. No rigidity.  Lymphadenopathy:     Cervical: No cervical adenopathy.  Skin:    Findings: No erythema or rash.  Neurological:     Mental Status: She is oriented to person, place, and time.     Cranial Nerves: No cranial nerve deficit.     Motor: No abnormal muscle tone.     Coordination: Coordination normal.     Deep Tendon Reflexes: Reflexes  normal.  Psychiatric:        Behavior: Behavior normal.        Thought Content: Thought content normal.        Judgment: Judgment normal.     Lab Results  Component Value Date   WBC 7.5 02/10/2023   HGB 14.4 02/10/2023   HCT 43.4 02/10/2023   PLT 315.0 02/10/2023   GLUCOSE 107 (H) 02/10/2023   CHOL 258 (H) 04/22/2022  TRIG 321.0 (H) 04/22/2022   HDL 49.10 04/22/2022   LDLDIRECT 120.0 04/22/2022   LDLCALC 56 04/21/2021   ALT 26 02/10/2023   AST 24 02/10/2023   NA 138 02/10/2023   K 4.2 02/10/2023   CL 100 02/10/2023   CREATININE 0.69 02/10/2023   BUN 13 02/10/2023   CO2 32 02/10/2023   TSH 1.22 02/10/2023   INR 1.0 02/10/2023   HGBA1C 5.6 04/21/2021    US  Abdomen Limited RUQ (LIVER/GB) Result Date: 04/30/2022 CLINICAL DATA:  Elevated alkaline phosphatase EXAM: ULTRASOUND ABDOMEN LIMITED RIGHT UPPER QUADRANT COMPARISON:  CT chest April 28, 2020 FINDINGS: Gallbladder: Surgically absent Common bile duct: Diameter: 11.5 mm distally Liver: Increased hepatic parenchymal echogenicity. No focal lesion. Portal vein is patent on color Doppler imaging with normal direction of blood flow towards the liver. Other: None. IMPRESSION: 1. Common bile duct is dilated measuring up to 11.5 mm. Recommend correlation with LFTs. If abnormal, further evaluation with MRCP or ERCP may be considered. 2. Increased hepatic parenchymal echogenicity suggestive of steatosis. Electronically Signed   By: Bard Moats M.D.   On: 04/30/2022 10:34    Assessment & Plan:   Problem List Items Addressed This Visit     ADD (attention deficit disorder) - Primary   ADD - on 15 mg Adderall XR  Rx given       Anxiety and depression   Chronic  Continue on Effexor  Xanax  prn  Potential benefits of a long term benzodiazepines  use as well as potential risks  and complications were explained to the patient and were aknowledged.      Coronary atherosclerosis   On Pravachol  since 2023       Headache     Cont w/Fioricet - rare use  Potential benefits of a long term Fioricet use as well as potential risks  and complications were explained to the patient and were aknowledged.        Relevant Medications   butalbital -acetaminophen -caffeine  (FIORICET) 50-325-40 MG tablet   Hyperlipidemia   On Crestor  - cramps; had to stop Started on pravastatin  if tolerated         Meds ordered this encounter  Medications   tirzepatide  (ZEPBOUND ) 2.5 MG/0.5ML injection vial    Sig: Inject 2.5 mg into the skin once a week.    Dispense:  2 mL    Refill:  3   amphetamine -dextroamphetamine (ADDERALL XR) 15 MG 24 hr capsule    Sig: Take 1 capsule by mouth every morning.    Dispense:  30 capsule    Refill:  0    Please fill on or after 01/16/24. Schedule office visit every 3 months   amphetamine -dextroamphetamine (ADDERALL XR) 15 MG 24 hr capsule    Sig: Take 1 capsule by mouth every morning.    Dispense:  30 capsule    Refill:  0    Please fill on or after 02/15/24. Schedule office visit every 3 months   butalbital -acetaminophen -caffeine  (FIORICET) 50-325-40 MG tablet    Sig: Take 1 tablet by mouth 3 (three) times daily as needed.    Dispense:  90 tablet    Refill:  0    Schedule OV every 3 months      Follow-up: Return in about 3 months (around 04/12/2024) for a follow-up visit.  Marolyn Noel, MD

## 2024-02-10 ENCOUNTER — Other Ambulatory Visit: Payer: Self-pay | Admitting: Internal Medicine

## 2024-02-10 DIAGNOSIS — R519 Headache, unspecified: Secondary | ICD-10-CM

## 2024-02-10 NOTE — Telephone Encounter (Signed)
 Copied from CRM [id number]. Topic: Clinical - Medication Refill >> Feb 10, 2024  9:25 AM Mia F wrote: Medication: butalbital -acetaminophen -caffeine  (FIORICET) 50-325-40 MG tablet  alprazolam  (XANAX ) 2 MG tablet   Has the patient contacted their pharmacy? Yes (Agent: If no, request that the patient contact the pharmacy for the refill. If patient does not wish to contact the pharmacy document the reason why and proceed with request.) (Agent: If yes, when and what did the pharmacy advise?)  This is the patient's preferred pharmacy:  [id number] - [place], [place] - [address] AT [address]NER OF [facility] [address] [place] [address] Phone: [phone] Fax: [phone]   Is this the correct pharmacy for this prescription? Yes If no, delete pharmacy and type the correct one.   Has the prescription been filled recently? No  Is the patient out of the medication? Yes  Has the patient been seen for an appointment in the last year OR does the patient have an upcoming appointment? Yes  Can we respond through MyChart? Yes  Agent: Please be advised that Rx refills may take up to 3 business days. We ask that you follow-up with your pharmacy.

## 2024-02-11 MED ORDER — ALPRAZOLAM 2 MG PO TABS: ORAL_TABLET | ORAL | 0 refills | Status: DC

## 2024-02-11 MED ORDER — BUTALBITAL-APAP-CAFFEINE 50-325-40 MG PO TABS: 1.0000 | ORAL_TABLET | Freq: Three times a day (TID) | ORAL | 0 refills | Status: DC | PRN

## 2024-03-02 ENCOUNTER — Other Ambulatory Visit: Payer: Self-pay | Admitting: Internal Medicine

## 2024-03-02 NOTE — Telephone Encounter (Signed)
 Copied from CRM #[id number]. Topic: Clinical - Medication Refill >> Mar 02, 2024 11:13 AM Dedra B wrote: Medication: amphetamine -dextroamphetamine (ADDERALL XR) 15 MG 24 hr capsule  Has the patient contacted their pharmacy? Yes, must contact office   This is the patient's preferred pharmacy:  [id number] - [place], [place] - [address] AT [address] OF [facility] [address] [place] [address] Phone: [phone] Fax: [phone]  Is this the correct pharmacy for this prescription? Yes  Has the prescription been filled recently? No  Is the patient out of the medication? Yes  Has the patient been seen for an appointment in the last year OR does the patient have an upcoming appointment? Yes  Can we respond through MyChart? Yes  Agent: Please be advised that Rx refills may take up to 3 business days. We ask that you follow-up with your pharmacy.

## 2024-03-05 ENCOUNTER — Other Ambulatory Visit: Payer: Self-pay | Admitting: Internal Medicine

## 2024-03-06 MED ORDER — AMPHETAMINE-DEXTROAMPHET ER 15 MG PO CP24: 15.0000 mg | ORAL_CAPSULE | Freq: Every morning | ORAL | 0 refills | Status: AC

## 2024-03-11 ENCOUNTER — Other Ambulatory Visit: Payer: Self-pay | Admitting: Internal Medicine

## 2024-03-11 DIAGNOSIS — R519 Headache, unspecified: Secondary | ICD-10-CM

## 2024-03-13 ENCOUNTER — Telehealth: Payer: Self-pay

## 2024-03-13 NOTE — Telephone Encounter (Signed)
 Copied from CRM [id number]. Topic: Clinical - Medication Question >> Mar 13, 2024  2:52 PM Robinson H wrote: Reason for CRM: [employer] calling to find out if patient has tried any higher dose of the statin medication.  [phone] Ext [phone]

## 2024-03-14 NOTE — Telephone Encounter (Signed)
 Looking into pts meds she has not.

## 2024-03-27 DIAGNOSIS — R300 Dysuria: Secondary | ICD-10-CM | POA: Diagnosis not present

## 2024-04-05 ENCOUNTER — Other Ambulatory Visit: Payer: Self-pay | Admitting: Internal Medicine

## 2024-04-05 NOTE — Telephone Encounter (Signed)
 Copied from CRM #[id number]. Topic: Clinical - Medication Refill >> Apr 05, 2024 10:51 AM Rosina BIRCH wrote: Medication: adderall  Has the patient contacted their pharmacy? Yes (Agent: If no, request that the patient contact the pharmacy for the refill. If patient does not wish to contact the pharmacy document the reason why and proceed with request.) (Agent: If yes, when and what did the pharmacy advise?)  This is the patient's preferred pharmacy:  [id number] - [place], [place] - [address] AT [address]NER OF [facility] [address] [place] [address] Phone: [phone] Fax: [phone]  Is this the correct pharmacy for this prescription? Yes If no, delete pharmacy and type the correct one.   Has the prescription been filled recently? Yes  Is the patient out of the medication? Yes  Has the patient been seen for an appointment in the last year OR does the patient have an upcoming appointment? Yes  Can we respond through MyChart? Yes  Agent: Please be advised that Rx refills may take up to 3 business days. We ask that you follow-up with your pharmacy.

## 2024-04-08 MED ORDER — AMPHETAMINE-DEXTROAMPHET ER 15 MG PO CP24: 15.0000 mg | ORAL_CAPSULE | ORAL | 0 refills | Status: AC

## 2024-04-09 ENCOUNTER — Other Ambulatory Visit: Payer: Self-pay | Admitting: Internal Medicine

## 2024-04-09 DIAGNOSIS — R519 Headache, unspecified: Secondary | ICD-10-CM

## 2024-04-09 NOTE — Telephone Encounter (Unsigned)
 Copied from CRM [id number]. 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 I did not see anything on file, but is asking if someone could call her back directly to follow up. Number is [phone] direct EXT [phone].

## 2024-04-15 ENCOUNTER — Other Ambulatory Visit: Payer: Self-pay | Admitting: Internal Medicine

## 2024-04-18 ENCOUNTER — Encounter: Payer: Self-pay | Admitting: Family Medicine

## 2024-04-18 ENCOUNTER — Ambulatory Visit: Admitting: Family Medicine

## 2024-04-18 ENCOUNTER — Ambulatory Visit: Payer: Self-pay | Admitting: *Deleted

## 2024-04-18 VITALS — BP 110/82 | HR 69 | Temp 98.5°F | Resp 16 | Ht 62.0 in | Wt 155.0 lb

## 2024-04-18 DIAGNOSIS — T3695XA Adverse effect of unspecified systemic antibiotic, initial encounter: Secondary | ICD-10-CM | POA: Diagnosis not present

## 2024-04-18 DIAGNOSIS — N390 Urinary tract infection, site not specified: Secondary | ICD-10-CM

## 2024-04-18 DIAGNOSIS — R300 Dysuria: Secondary | ICD-10-CM | POA: Diagnosis not present

## 2024-04-18 LAB — POCT URINALYSIS DIP (CLINITEK)
Glucose, UA: NEGATIVE mg/dL
Nitrite, UA: POSITIVE — AB
POC PROTEIN,UA: 30 — AB
Spec Grav, UA: >=1.03 — AB (ref 1.010–1.025)
Urobilinogen, UA: 4 U/dL — AB
pH, UA: 6 (ref 5.0–8.0)

## 2024-04-18 MED ORDER — FLUCONAZOLE 150 MG PO TABS: 150.0000 mg | ORAL_TABLET | Freq: Once | ORAL | 0 refills | Status: AC

## 2024-04-18 MED ORDER — TERCONAZOLE 0.4 % VA CREA: 1.0000 | TOPICAL_CREAM | Freq: Every day | VAGINAL | 0 refills | Status: AC

## 2024-04-18 MED ORDER — CEPHALEXIN 500 MG PO CAPS: 500.0000 mg | ORAL_CAPSULE | Freq: Three times a day (TID) | ORAL | 0 refills | Status: AC

## 2024-04-18 NOTE — Progress Notes (Signed)
 ACUTE VISIT Chief Complaint  Patient presents with   Urinary Tract Infection    Patient has had multiple UTI's this year. Is not sure why she keeps having them.    Discussed the use of AI scribe software for clinical note transcription with the patient, who gave verbal consent to proceed.  History of Present Illness Julia Zimmerman is a 73 year old female with past medical history significant for GERD, hypertension, hyperlipidemia, and recurrent UTIs here today complaining of urinary symptoms.  She has been experiencing urinary symptoms intermittently for the past year or so, with the current episode beginning approximately two weeks ago.  Symptoms include moderate to severe dysuria, frequency, and urgency, leading to the use of incontinence products due to unexpected urination.  No gross hematuria or vaginal bleeding has been noted. Occasionally, she feels there might be a discharge, but none has been observed.  She tried OTC Phenazopyridine and Urostat. Negative for fever, chills, nausea, vomiting, abdominal pain, changes in bowel habits,or rash.  States that she was treated with antibiotics in March/2025. Ucx in 03/2019    Component Ref Range & Units (hover) 5 yr ago  MICRO NUMBER: [id number]  SPECIMEN QUALITY: Adequate  Source NOT GIVEN  STATUS: FINAL  ISOLATE 1: Escherichia coli Abnormal      Urinary issues have increased since becoming sexually active again a year and a half ago, after a 15-year period of celibacy following widowhood. She is in a monogamous relationship. No hx of pyelonephritis.  Review of Systems  Constitutional:  Negative for activity change and appetite change.  Cardiovascular:  Negative for leg swelling.  Genitourinary:  Negative for flank pain and pelvic pain.  Neurological:  Negative for syncope and weakness.  Psychiatric/Behavioral:  Negative for confusion and hallucinations.   See other pertinent positives and negatives in HPI.  Medications  Ordered Prior to Encounter[1]  Past Medical History:  Diagnosis Date   ADD 10/30/2008   Qualifier: Diagnosis of  By: Viviann Raddle MD, Marsha SAUNDERS    Allergy    Anxiety    ANXIETY DEPRESSION 03/29/2007   Qualifier: Diagnosis of  By: Viviann Raddle MD, Marsha SAUNDERS    Cataract    Complication of anesthesia    Cough 04/21/2010   full eval including allergist. Working diagnosis - reflux.   Depression    Endometriosis    Episodic tension type headache 11/07/2007   Qualifier: Diagnosis of  By: Viviann Raddle MD, Marsha SAUNDERS    Essential hypertension, benign 05/15/2010   Qualifier: Diagnosis of  By: Eyvonne MD, Debby ORN    GERD 04/21/2010   Qualifier: Diagnosis of  By: Viviann Raddle MD, Marsha SAUNDERS    GERD (gastroesophageal reflux disease)    Hyperlipidemia    HYPERLIPIDEMIA 05/15/2010   Qualifier: Diagnosis of  By: Eyvonne MD, Debby ORN    Learning disability    poor attention span and difficulty with retention   Lumbosacral facet joint syndrome 04/11/2018   ONYCHOMYCOSIS 10/30/2008   successfully treated with lamisil.   Osteopenia    PONV (postoperative nausea and vomiting)    Primary localized osteoarthritis of right knee 04/11/2018   RHINITIS 04/21/2010   Qualifier: Diagnosis of  By: Viviann Raddle MD, Marsha SAUNDERS    Allergies[2]  Social History   Socioeconomic History   Marital status: Widowed    Spouse name: Not on file   Number of children: 0   Years of education: 14   Highest education level: Associate degree: academic program  Occupational History   Occupation: RETIRED/CUSTOMER SERVICE    Employer: [employer]  Tobacco Use   Smoking status: Former    Current packs/day: 0.00    Average packs/day: 1.5 packs/day for 19.0 years (28.5 ttl pk-yrs)    Types: Cigarettes    Start date: 10/27/1971    Quit date: 10/27/1990    Years since quitting: 33.4   Smokeless tobacco: Never  Vaping Use   Vaping status: Never Used  Substance and Sexual Activity   Alcohol use: No   Drug use: No    Sexual activity: Not Currently  Other Topics Concern   Not on file  Social History Narrative   HSG, 2 years [employer] in [place]. Married 1975- 34 yrs/widowed. No children. 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 Lives alone with her dogs (resucues -7), cats (3). No abuse history.    Social Drivers of Health   Tobacco Use: Medium Risk (04/18/2024)   Patient History    Smoking Tobacco Use: Former    Smokeless Tobacco Use: Never    Passive Exposure: Not on file  Financial Resource Strain: Low Risk (09/02/2023)   Overall Financial Resource Strain (CARDIA)    Difficulty of Paying Living Expenses: Not very hard  Food Insecurity: No Food Insecurity (09/02/2023)   Hunger Vital Sign    Worried About Running Out of Food in the Last Year: Never true    Ran Out of Food in the Last Year: Never true  Transportation Needs: No Transportation Needs (09/02/2023)   PRAPARE - Administrator, Civil Service (Medical): No    Lack of Transportation (Non-Medical): No  Physical Activity: Insufficiently Active (09/02/2023)   Exercise Vital Sign    Days of Exercise per Week: 1 day    Minutes of Exercise per Session: 30 min  Stress: No Stress Concern Present (09/02/2023)   [employer] of Occupational Health - Occupational Stress Questionnaire    Feeling of Stress : Only a little  Social Connections: Socially Isolated (09/02/2023)   Social Connection and Isolation Panel    Frequency of Communication with Friends and Family: More than three times a week    Frequency of Social Gatherings with Friends and Family: Once a week    Attends Religious Services: Never    Database Administrator or Organizations: No    Attends Engineer, Structural: Not on file    Marital Status: Widowed  Depression (PHQ2-9): Medium Risk (07/28/2023)   Depression (PHQ2-9)    PHQ-2 Score: 5  Alcohol Screen: Low Risk (09/02/2023)   Alcohol Screen    Last Alcohol Screening Score  (AUDIT): 1  Housing: Low Risk (09/02/2023)   Housing Stability Vital Sign    Unable to Pay for Housing in the Last Year: No    Number of Times Moved in the Last Year: 0    Homeless in the Last Year: No  Utilities: Not At Risk (09/05/2023)   AHC Utilities    Threatened with loss of utilities: No  Health Literacy: Adequate Health Literacy (09/05/2023)   B1300 Health Literacy    Frequency of need for help with medical instructions: Never   Vitals:   04/18/24 1530  BP: 110/82  Pulse: 69  Resp: 16  Temp: 98.5 F (36.9 C)  SpO2: 90%   Body mass index is 28.35 kg/m.  Physical Exam Vitals and nursing note reviewed.  Constitutional:      General: She is not in  acute distress.    Appearance: She is well-developed.  HENT:     Head: Normocephalic and atraumatic.  Eyes:     Conjunctiva/sclera: Conjunctivae normal.  Cardiovascular:     Rate and Rhythm: Normal rate and regular rhythm.     Heart sounds: No murmur heard. Pulmonary:     Effort: Pulmonary effort is normal. No respiratory distress.     Breath sounds: Normal breath sounds.  Abdominal:     Palpations: Abdomen is soft. There is no mass.     Tenderness: There is no abdominal tenderness. There is no right CVA tenderness or left CVA tenderness.  Skin:    General: Skin is warm.     Findings: No erythema.  Neurological:     General: No focal deficit present.     Mental Status: She is alert and oriented to person, place, and time.     Gait: Gait normal.  Psychiatric:        Mood and Affect: Mood and affect normal.    ASSESSMENT AND PLAN:  Ms. Julia Zimmerman was seen today for urinary tract infection.  Diagnoses and all orders for this visit: Orders Placed This Encounter  Procedures   Urine Culture   POCT URINALYSIS DIP (CLINITEK)   Dysuria Problem is recurrent. We discussed differential diagnosis. Urine dipstick today with several abnormal, some may be caused by OTC treatments. Orange,cloudy,ketones trace,+protein and  nitrite, 3+ leuk. Urine sent for culture.  -     POCT URINALYSIS DIP (CLINITEK) -     Urine Culture; Future -     Urine Culture  Antibiotic causing adverse effect Reports history of yeast with antibiotic treatments, requesting Diflucan . Recommend trying first topical treatment, Terazol, daily for 7 days and if symptoms are persistent, she can take fluconazole  150 mg x 1.  -     fluconazole  (DIFLUCAN ) 150 MG tablet; Take 1 tablet (150 mg total) by mouth once for 1 dose. -     terconazole  (TERAZOL 7 ) 0.4 % vaginal cream; Place 1 applicator vaginally at bedtime for 7 days.  Urinary tract infection without hematuria, site unspecified Empiric treatment with cephalexin  500 mg 3 times daily for 5 days recommended. We will follow urine culture and will tailor treatment accordingly. Continue adequate hydration. Instructed about warning signs. Recommend arranging appointment with PCP if symptoms are persistent or continue being recurrent.  -     cephALEXin  (KEFLEX ) 500 MG capsule; Take 1 capsule (500 mg total) by mouth 3 (three) times daily for 5 days.  Return in about 2 weeks (around 05/02/2024), or if symptoms worsen or fail to improve, for if still having urinary symptoms with PCP.  Julia Zimmerman G. Krrish Freund, MD  [facility]. [place] office.     [1]  Current Outpatient Medications on File Prior to Visit  Medication Sig Dispense Refill   alprazolam  (XANAX ) 2 MG tablet TAKE 1 TABLET BY MOUTH 3 TIMES A DAY AS NEEDED FOR ANXIETY 270 tablet 0   alprazolam  (XANAX ) 2 MG tablet TAKE 1 TABLET BY MOUTH THREE TIMES A DAY AS NEEDED FOR ANXIETY 270 tablet 0   amphetamine -dextroamphetamine (ADDERALL XR) 15 MG 24 hr capsule Take 1 capsule by mouth every morning. 30 capsule 0   amphetamine -dextroamphetamine (ADDERALL XR) 15 MG 24 hr capsule Take 1 capsule by mouth every morning. 30 capsule 0   B Complex-C-Folic Acid (B-COMPLEX/FOLIC ACID/VITAMIN C) TBCR TAKE 1 TABLET BY MOUTH EVERY DAY 125 tablet 3    butalbital -acetaminophen -caffeine  (FIORICET) 50-325-40 MG tablet TAKE 1  TABLET BY MOUTH THREE TIMES A DAY AS NEEDED 90 tablet 1   celecoxib  (CELEBREX ) 200 MG capsule Take 1 capsule (200 mg total) by mouth 2 (two) times daily. 180 capsule 1   diphenhydrAMINE  (BENADRYL ) 25 MG tablet Take 25 mg by mouth daily as needed for allergies.     famotidine  (PEPCID ) 40 MG tablet TAKE 1 TABLET(40 MG) BY MOUTH AT BEDTIME 90 tablet 3   losartan -hydrochlorothiazide  (HYZAAR) 50-12.5 MG tablet TAKE 1 TABLET BY MOUTH EVERY DAY 90 tablet 3   pravastatin  (PRAVACHOL ) 10 MG tablet TAKE 1 TABLET BY MOUTH EVERY DAY 90 tablet 3   RABEprazole  (ACIPHEX ) 20 MG tablet Take 1 tablet (20 mg total) by mouth daily. 30 tablet 11   rizatriptan  (MAXALT ) 10 MG tablet Take 1 tablet (10 mg total) by mouth once as needed for up to 1 dose for migraine. May repeat in 2 hours if needed 12 tablet 12   tirzepatide  (ZEPBOUND ) 2.5 MG/0.5ML injection vial Inject 2.5 mg into the skin once a week. 2 mL 3   traMADol  (ULTRAM ) 50 MG tablet Take 1 tablet (50 mg total) by mouth every 6 (six) hours as needed for severe pain (pain score 7-10). 120 tablet 1   alprazolam  (XANAX ) 2 MG tablet TAKE 1 TABLET BY MOUTH EVERY DAY AT BEDTIME AS NEEDED FOR INSOMNIA 90 tablet 1   aluminum-magnesium  hydroxide 200-200 MG/5ML suspension Take 15 mLs by mouth every 6 (six) hours as needed for indigestion. (Patient not taking: Reported on 01/12/2024)     pantoprazole  (PROTONIX ) 40 MG tablet TAKE 1 TABLET BY MOUTH EVERY DAY - NEED OFFICE VISIT (Patient not taking: Reported on 01/12/2024) 90 tablet 3   sucralfate  (CARAFATE ) 1 GM/10ML suspension Take 10 mLs (1 g total) by mouth 4 (four) times daily -  with meals and at bedtime. (Patient not taking: Reported on 01/12/2024) 420 mL 3   No current facility-administered medications on file prior to visit.  [2]  Allergies Allergen Reactions   Sulfa Antibiotics Hives and Other (See Comments)   Crestor  [Rosuvastatin ]     cramps    Statins     Bad cramps   Sulfur Dioxide    Codeine Nausea Only   Hydrocodone  Nausea Only

## 2024-04-18 NOTE — Patient Instructions (Addendum)
 A few things to remember from today's visit:  Urinary pain - Plan: POCT URINALYSIS DIP (CLINITEK), Urine Culture, Urine Culture  Antibiotic causing adverse effect - Plan: fluconazole  (DIFLUCAN ) 150 MG tablet, terconazole  (TERAZOL 7 ) 0.4 % vaginal cream  Urinary tract infection without hematuria, site unspecified - Plan: cephALEXin  (KEFLEX ) 500 MG capsule Try vaginal cream first if symptoms of yeast and if not better you can take Diflucan  once. Please arrange appt with PCP to determine next step for recurrent urine tract infections.  Do not use My Chart to request refills or for acute issues that need immediate attention. If you send a my chart message, it may take a few days to be addressed, specially if I am not in the office.  Please be sure medication list is accurate. If a new problem present, please set up appointment sooner than planned today.

## 2024-04-18 NOTE — Telephone Encounter (Signed)
 FYI Only or Action Required?: FYI only for provider: appointment scheduled on 12/17.  Patient was last seen in primary care on 01/12/2024 by Plotnikov, Karlynn GAILS, MD.  Called Nurse Triage reporting Dysuria.  Symptoms began several days ago.  Interventions attempted: OTC medications: AZO.  Symptoms are: gradually worsening.  Triage Disposition: See HCP Within 4 Hours (Or PCP Triage)  Patient/caregiver understands and will follow disposition?: Yes  Copied from CRM [id number]. Topic: Clinical - Red Word Triage >> Apr 18, 2024  9:06 AM Julia Zimmerman wrote: Red Word that prompted transfer to Nurse Triage: UTI has returned. In a lot of pain, frequent urination, and burning. Gotten worse. Reason for Disposition  [1] SEVERE pain with urination (e.g., excruciating) AND [2] not improved after 2 hours of pain medicine  Answer Assessment - Initial Assessment Questions 1. SEVERITY: How bad is the pain?  (e.g., Scale 1-10; mild, moderate, or severe)     10/10 2. FREQUENCY: How many times have you had painful urination today?      3 times 3. PATTERN: Is pain present every time you urinate or just sometimes?      yes 4. ONSET: When did the painful urination start?      4-5 days ago 5. FEVER: Do you have a fever? If Yes, ask: What is your temperature, how was it measured, and when did it start?     no 6. PAST UTI: Have you had a urine infection before? If Yes, ask: When was the last time? and What happened that time?      11/25- cefuroxime  (Ceftin ) 250 MG tablet; Take 1 tablet (250 mg total) by mouth in the morning and 1 tablet (250 mg total) in the evening 7. CAUSE: What do you think is causing the painful urination?  (e.g., UTI, scratch, Herpes sore)     UTI 8. OTHER SYMPTOMS: Do you have any other symptoms? (e.g., blood in urine, flank pain, genital sores, urgency, vaginal discharge)     Incontinence, frequency  Protocols used: Urination Pain - Female-A-AH

## 2024-04-21 ENCOUNTER — Ambulatory Visit: Payer: Self-pay | Admitting: Family Medicine

## 2024-04-21 LAB — URINE CULTURE
MICRO NUMBER:: 17368023
SPECIMEN QUALITY:: ADEQUATE

## 2024-04-24 ENCOUNTER — Other Ambulatory Visit: Payer: Self-pay | Admitting: Family Medicine

## 2024-04-24 MED ORDER — NITROFURANTOIN MONOHYD MACRO 100 MG PO CAPS: 100.0000 mg | ORAL_CAPSULE | Freq: Two times a day (BID) | ORAL | 0 refills | Status: AC

## 2024-05-01 ENCOUNTER — Other Ambulatory Visit: Payer: Self-pay | Admitting: Internal Medicine

## 2024-05-09 ENCOUNTER — Ambulatory Visit: Admitting: Internal Medicine

## 2024-05-09 ENCOUNTER — Other Ambulatory Visit: Payer: Self-pay | Admitting: Internal Medicine

## 2024-05-09 ENCOUNTER — Encounter: Payer: Self-pay | Admitting: Internal Medicine

## 2024-05-09 VITALS — BP 116/72 | HR 86 | Ht 62.0 in | Wt 155.8 lb

## 2024-05-09 DIAGNOSIS — N3090 Cystitis, unspecified without hematuria: Secondary | ICD-10-CM

## 2024-05-09 DIAGNOSIS — F419 Anxiety disorder, unspecified: Secondary | ICD-10-CM | POA: Diagnosis not present

## 2024-05-09 DIAGNOSIS — R0789 Other chest pain: Secondary | ICD-10-CM

## 2024-05-09 DIAGNOSIS — R519 Headache, unspecified: Secondary | ICD-10-CM | POA: Diagnosis not present

## 2024-05-09 DIAGNOSIS — I2583 Coronary atherosclerosis due to lipid rich plaque: Secondary | ICD-10-CM | POA: Diagnosis not present

## 2024-05-09 DIAGNOSIS — I10 Essential (primary) hypertension: Secondary | ICD-10-CM | POA: Diagnosis not present

## 2024-05-09 DIAGNOSIS — F32A Depression, unspecified: Secondary | ICD-10-CM | POA: Diagnosis not present

## 2024-05-09 DIAGNOSIS — F902 Attention-deficit hyperactivity disorder, combined type: Secondary | ICD-10-CM

## 2024-05-09 LAB — COMPREHENSIVE METABOLIC PANEL WITH GFR
ALT: 18 U/L (ref 3–35)
AST: 20 U/L (ref 5–37)
Albumin: 4.2 g/dL (ref 3.5–5.2)
Alkaline Phosphatase: 102 U/L (ref 39–117)
BUN: 13 mg/dL (ref 6–23)
CO2: 29 meq/L (ref 19–32)
Calcium: 9.3 mg/dL (ref 8.4–10.5)
Chloride: 103 meq/L (ref 96–112)
Creatinine, Ser: 0.66 mg/dL (ref 0.40–1.20)
GFR: 88.02 mL/min
Glucose, Bld: 97 mg/dL (ref 70–99)
Potassium: 3.9 meq/L (ref 3.5–5.1)
Sodium: 140 meq/L (ref 135–145)
Total Bilirubin: 0.4 mg/dL (ref 0.2–1.2)
Total Protein: 7.4 g/dL (ref 6.0–8.3)

## 2024-05-09 LAB — CBC WITH DIFFERENTIAL/PLATELET
Basophils Absolute: 0.1 K/uL (ref 0.0–0.1)
Basophils Relative: 1.3 % (ref 0.0–3.0)
Eosinophils Absolute: 0.2 K/uL (ref 0.0–0.7)
Eosinophils Relative: 2.6 % (ref 0.0–5.0)
HCT: 40.9 % (ref 36.0–46.0)
Hemoglobin: 13.6 g/dL (ref 12.0–15.0)
Lymphocytes Relative: 22.7 % (ref 12.0–46.0)
Lymphs Abs: 1.6 K/uL (ref 0.7–4.0)
MCHC: 33.3 g/dL (ref 30.0–36.0)
MCV: 88.6 fl (ref 78.0–100.0)
Monocytes Absolute: 0.8 K/uL (ref 0.1–1.0)
Monocytes Relative: 11.5 % (ref 3.0–12.0)
Neutro Abs: 4.3 K/uL (ref 1.4–7.7)
Neutrophils Relative %: 61.9 % (ref 43.0–77.0)
Platelets: 295 K/uL (ref 150.0–400.0)
RBC: 4.62 Mil/uL (ref 3.87–5.11)
RDW: 13.7 % (ref 11.5–15.5)
WBC: 6.9 K/uL (ref 4.0–10.5)

## 2024-05-09 LAB — LIPID PANEL
Cholesterol: 205 mg/dL — ABNORMAL HIGH (ref 28–200)
HDL: 50.7 mg/dL
LDL Cholesterol: 116 mg/dL — ABNORMAL HIGH (ref 10–99)
NonHDL: 154.45
Total CHOL/HDL Ratio: 4
Triglycerides: 193 mg/dL — ABNORMAL HIGH (ref 10.0–149.0)
VLDL: 38.6 mg/dL (ref 0.0–40.0)

## 2024-05-09 LAB — URINALYSIS, ROUTINE W REFLEX MICROSCOPIC
Bilirubin Urine: NEGATIVE
Hgb urine dipstick: NEGATIVE
Ketones, ur: NEGATIVE
Nitrite: NEGATIVE
RBC / HPF: NONE SEEN
Specific Gravity, Urine: 1.01 (ref 1.000–1.030)
Total Protein, Urine: NEGATIVE
Urine Glucose: NEGATIVE
Urobilinogen, UA: 0.2 (ref 0.0–1.0)
pH: 6.5 (ref 5.0–8.0)

## 2024-05-09 LAB — TSH: TSH: 1.75 u[IU]/mL (ref 0.35–5.50)

## 2024-05-09 MED ORDER — FLUCONAZOLE 150 MG PO TABS: 150.0000 mg | ORAL_TABLET | Freq: Once | ORAL | 0 refills | Status: AC

## 2024-05-09 MED ORDER — NITROFURANTOIN MONOHYD MACRO 100 MG PO CAPS: 100.0000 mg | ORAL_CAPSULE | Freq: Two times a day (BID) | ORAL | 1 refills | Status: AC

## 2024-05-09 MED ORDER — FAMOTIDINE 40 MG PO TABS: ORAL_TABLET | ORAL | 3 refills | Status: AC

## 2024-05-09 MED ORDER — ASPIRIN 81 MG PO TBEC: 81.0000 mg | DELAYED_RELEASE_TABLET | Freq: Every day | ORAL | Status: AC

## 2024-05-09 NOTE — Patient Instructions (Signed)

## 2024-05-09 NOTE — Assessment & Plan Note (Signed)
 Pt had some mid-chest CP and tightness,  R jaw pain off and on x 2 mo - summer-fall 2025. On Pravastatin . Re-start ASA Options to test were discussed Will order coronary CT angio w/cor morphology

## 2024-05-09 NOTE — Assessment & Plan Note (Signed)
 Recurrent. Urology appt is pending Macrobid  prn Difucan prn

## 2024-05-09 NOTE — Assessment & Plan Note (Signed)
NAS diet Continue on losartan HCTZ

## 2024-05-09 NOTE — Progress Notes (Signed)
 "  Subjective:  Patient ID: Julia Zimmerman, female    DOB: 1951-02-21  Age: 74 y.o. MRN: [id number]  CC: Annual Exam (Annual Exam)   HPI JEMYA DEPIERRO presents for FMS, CAD, ADD, GERD Pt had some mid-chest CP and tightness,  R jaw pain off and on x 2 mo - summer-fall 2025.   Outpatient Medications Prior to Visit  Medication Sig Dispense Refill   alprazolam  (XANAX ) 2 MG tablet TAKE 1 TABLET BY MOUTH 3 TIMES A DAY AS NEEDED FOR ANXIETY 270 tablet 0   alprazolam  (XANAX ) 2 MG tablet TAKE 1 TABLET BY MOUTH THREE TIMES A DAY AS NEEDED FOR ANXIETY 270 tablet 0   amphetamine -dextroamphetamine (ADDERALL XR) 15 MG 24 hr capsule Take 1 capsule by mouth every morning. 30 capsule 0   amphetamine -dextroamphetamine (ADDERALL XR) 15 MG 24 hr capsule Take 1 capsule by mouth every morning. 30 capsule 0   B Complex-C-Folic Acid (B-COMPLEX/FOLIC ACID/VITAMIN C) TBCR TAKE 1 TABLET BY MOUTH EVERY DAY 125 tablet 3   butalbital -acetaminophen -caffeine  (FIORICET) 50-325-40 MG tablet TAKE 1 TABLET BY MOUTH THREE TIMES A DAY AS NEEDED 90 tablet 1   celecoxib  (CELEBREX ) 200 MG capsule Take 1 capsule (200 mg total) by mouth 2 (two) times daily. 180 capsule 1   diphenhydrAMINE  (BENADRYL ) 25 MG tablet Take 25 mg by mouth daily as needed for allergies.     losartan -hydrochlorothiazide  (HYZAAR) 50-12.5 MG tablet TAKE 1 TABLET BY MOUTH EVERY DAY 90 tablet 3   pravastatin  (PRAVACHOL ) 10 MG tablet TAKE 1 TABLET BY MOUTH EVERY DAY 90 tablet 3   RABEprazole  (ACIPHEX ) 20 MG tablet Take 1 tablet (20 mg total) by mouth daily. 30 tablet 11   rizatriptan  (MAXALT ) 10 MG tablet Take 1 tablet (10 mg total) by mouth once as needed for up to 1 dose for migraine. May repeat in 2 hours if needed 12 tablet 12   tirzepatide  (ZEPBOUND ) 2.5 MG/0.5ML injection vial Inject 2.5 mg into the skin once a week. 2 mL 3   traMADol  (ULTRAM ) 50 MG tablet Take 1 tablet (50 mg total) by mouth every 6 (six) hours as needed for severe pain (pain score  7-10). 120 tablet 1   famotidine  (PEPCID ) 40 MG tablet TAKE 1 TABLET(40 MG) BY MOUTH AT BEDTIME 90 tablet 3   alprazolam  (XANAX ) 2 MG tablet TAKE 1 TABLET BY MOUTH EVERY DAY AT BEDTIME AS NEEDED FOR INSOMNIA 90 tablet 1   aluminum-magnesium  hydroxide 200-200 MG/5ML suspension Take 15 mLs by mouth every 6 (six) hours as needed for indigestion. (Patient not taking: Reported on 05/09/2024)     pantoprazole  (PROTONIX ) 40 MG tablet TAKE 1 TABLET BY MOUTH EVERY DAY - NEED OFFICE VISIT (Patient not taking: Reported on 05/09/2024) 90 tablet 3   sucralfate  (CARAFATE ) 1 GM/10ML suspension Take 10 mLs (1 g total) by mouth 4 (four) times daily -  with meals and at bedtime. (Patient not taking: Reported on 05/09/2024) 420 mL 3   No facility-administered medications prior to visit.    ROS: Review of Systems  Constitutional:  Negative for activity change, appetite change, chills, fatigue and unexpected weight change.  HENT:  Negative for congestion, mouth sores and sinus pressure.   Eyes:  Negative for visual disturbance.  Respiratory:  Negative for cough and chest tightness.   Cardiovascular:  Positive for chest pain.  Gastrointestinal:  Negative for abdominal pain and nausea.  Genitourinary:  Negative for difficulty urinating, frequency and vaginal pain.  Musculoskeletal:  Negative for back  pain and gait problem.  Skin:  Negative for pallor and rash.  Neurological:  Negative for dizziness, tremors, weakness, numbness and headaches.  Psychiatric/Behavioral:  Positive for decreased concentration. Negative for confusion, sleep disturbance and suicidal ideas. The patient is not nervous/anxious.     Objective:  BP 116/72   Pulse 86   Ht 5' 2 (1.575 m)   Wt 155 lb 12.8 oz (70.7 kg)   SpO2 99%   BMI 28.50 kg/m   BP Readings from Last 3 Encounters:  05/09/24 116/72  04/18/24 110/82  01/12/24 112/70    Wt Readings from Last 3 Encounters:  05/09/24 155 lb 12.8 oz (70.7 kg)  04/18/24 155 lb (70.3 kg)   01/12/24 166 lb 3.2 oz (75.4 kg)    Physical Exam Constitutional:      General: She is not in acute distress.    Appearance: She is well-developed.  HENT:     Head: Normocephalic.     Right Ear: External ear normal.     Left Ear: External ear normal.     Nose: Nose normal.  Eyes:     General:        Right eye: No discharge.        Left eye: No discharge.     Conjunctiva/sclera: Conjunctivae normal.     Pupils: Pupils are equal, round, and reactive to light.  Neck:     Thyroid : No thyromegaly.     Vascular: No JVD.     Trachea: No tracheal deviation.  Cardiovascular:     Rate and Rhythm: Normal rate and regular rhythm.     Heart sounds: Normal heart sounds.  Pulmonary:     Effort: No respiratory distress.     Breath sounds: No stridor. No wheezing.  Abdominal:     General: Bowel sounds are normal. There is no distension.     Palpations: Abdomen is soft. There is no mass.     Tenderness: There is no abdominal tenderness. There is no guarding or rebound.  Musculoskeletal:        General: No tenderness.     Cervical back: Normal range of motion and neck supple. No rigidity.  Lymphadenopathy:     Cervical: No cervical adenopathy.  Skin:    Findings: No erythema or rash.  Neurological:     Mental Status: She is oriented to person, place, and time.     Cranial Nerves: No cranial nerve deficit.     Motor: No abnormal muscle tone.     Coordination: Coordination normal.     Deep Tendon Reflexes: Reflexes normal.  Psychiatric:        Behavior: Behavior normal.        Thought Content: Thought content normal.        Judgment: Judgment normal.     A total time of 45 minutes was spent preparing to see the patient, reviewing tests, x-rays, operative reports and other medical records.  Also, obtaining history and performing comprehensive physical exam.  Additionally, counseling the patient regarding the above listed issues - CP, CAD, UTI.   Finally, documenting clinical  information in the health records, coordination of care, educating the patient - CT angio.  Lab Results  Component Value Date   WBC 7.5 02/10/2023   HGB 14.4 02/10/2023   HCT 43.4 02/10/2023   PLT 315.0 02/10/2023   GLUCOSE 107 (H) 02/10/2023   CHOL 258 (H) 04/22/2022   TRIG 321.0 (H) 04/22/2022   HDL 49.10 04/22/2022   LDLDIRECT  120.0 04/22/2022   LDLCALC 56 04/21/2021   ALT 26 02/10/2023   AST 24 02/10/2023   NA 138 02/10/2023   K 4.2 02/10/2023   CL 100 02/10/2023   CREATININE 0.69 02/10/2023   BUN 13 02/10/2023   CO2 32 02/10/2023   TSH 1.22 02/10/2023   INR 1.0 02/10/2023   HGBA1C 5.6 04/21/2021    US  Abdomen Limited RUQ (LIVER/GB) Result Date: 04/30/2022 CLINICAL DATA:  Elevated alkaline phosphatase EXAM: ULTRASOUND ABDOMEN LIMITED RIGHT UPPER QUADRANT COMPARISON:  CT chest April 28, 2020 FINDINGS: Gallbladder: Surgically absent Common bile duct: Diameter: 11.5 mm distally Liver: Increased hepatic parenchymal echogenicity. No focal lesion. Portal vein is patent on color Doppler imaging with normal direction of blood flow towards the liver. Other: None. IMPRESSION: 1. Common bile duct is dilated measuring up to 11.5 mm. Recommend correlation with LFTs. If abnormal, further evaluation with MRCP or ERCP may be considered. 2. Increased hepatic parenchymal echogenicity suggestive of steatosis. Electronically Signed   By: Bard Moats M.D.   On: 04/30/2022 10:34    Assessment & Plan:   Problem List Items Addressed This Visit     Anxiety and depression   Chronic  Continue on Effexor  Xanax  prn  Potential benefits of a long term benzodiazepines  use as well as potential risks  and complications were explained to the patient and were aknowledged.      ADD (attention deficit disorder)   ADD - on 15 mg Adderall XR  Rx given       Essential hypertension, benign   NAS diet Continue on losartan  HCTZ      Relevant Medications   aspirin  EC 81 MG tablet   Other Relevant  Orders   Comprehensive metabolic panel with GFR   CBC with Differential/Platelet   Urinalysis   Headache   Cont w/Fioricet - rare use  Potential benefits of a long term Fioricet use as well as potential risks  and complications were explained to the patient and were aknowledged.        Relevant Medications   aspirin  EC 81 MG tablet   Coronary atherosclerosis   Pt had some mid-chest CP and tightness,  R jaw pain off and on x 2 mo - summer-fall 2025. On Pravastatin . Re-start ASA Options to test were discussed Will order coronary CT angio w/cor morphology      Relevant Medications   aspirin  EC 81 MG tablet   Other Relevant Orders   CT CORONARY MORPH W/CTA COR W/SCORE W/CA W/CM &/OR WO/CM   Comprehensive metabolic panel with GFR   CBC with Differential/Platelet   TSH   Lipid panel   Midsternal chest pain - Primary   Pt had some mid-chest CP and tightness,  R jaw pain off and on x 2 mo - summer-fall 2025. On Pravastatin . Re-start ASA Options to test were discussed Will order coronary CT angio w/cor morphology      Relevant Orders   CT CORONARY MORPH W/CTA COR W/SCORE W/CA W/CM &/OR WO/CM   Comprehensive metabolic panel with GFR   Lipid panel   Cystitis   Recurrent. Urology appt is pending Macrobid  prn Difucan prn         Meds ordered this encounter  Medications   aspirin  EC 81 MG tablet    Sig: Take 1 tablet (81 mg total) by mouth daily.   nitrofurantoin , macrocrystal-monohydrate, (MACROBID ) 100 MG capsule    Sig: Take 1 capsule (100 mg total) by mouth 2 (two) times daily.  Dispense:  10 capsule    Refill:  1   fluconazole  (DIFLUCAN ) 150 MG tablet    Sig: Take 1 tablet (150 mg total) by mouth once for 1 dose.    Dispense:  1 tablet    Refill:  0   famotidine  (PEPCID ) 40 MG tablet    Sig: TAKE 1 TABLET(40 MG) BY MOUTH AT BEDTIME    Dispense:  90 tablet    Refill:  3      Follow-up: Return in about 3 months (around 08/07/2024) for a follow-up visit.  Marolyn Noel, MD "

## 2024-05-09 NOTE — Assessment & Plan Note (Signed)
 Chronic  Continue on Effexor  Xanax  prn  Potential benefits of a long term benzodiazepines  use as well as potential risks  and complications were explained to the patient and were aknowledged.

## 2024-05-09 NOTE — Assessment & Plan Note (Signed)
  Cont w/Fioricet - rare use  Potential benefits of a long term Fioricet use as well as potential risks  and complications were explained to the patient and were aknowledged.

## 2024-05-09 NOTE — Assessment & Plan Note (Signed)
 ADD - on 15 mg Adderall XR  Rx given

## 2024-05-10 ENCOUNTER — Ambulatory Visit: Payer: Self-pay | Admitting: Internal Medicine

## 2024-05-10 DIAGNOSIS — I2583 Coronary atherosclerosis due to lipid rich plaque: Secondary | ICD-10-CM

## 2024-05-18 ENCOUNTER — Telehealth (HOSPITAL_COMMUNITY): Payer: Self-pay | Admitting: *Deleted

## 2024-05-18 MED ORDER — METOPROLOL TARTRATE 50 MG PO TABS: ORAL_TABLET | ORAL | 0 refills | Status: AC

## 2024-05-18 NOTE — Addendum Note (Signed)
 Addended by: SHERRINE GILLS A on: 05/18/2024 03:25 PM   Modules accepted: Orders

## 2024-05-18 NOTE — Telephone Encounter (Signed)
 Reaching out to patient to offer assistance regarding upcoming cardiac imaging study; pt verbalizes understanding of appt date/time, parking situation and where to check in, pre-test NPO status and medications ordered, and verified current allergies; name and call back number provided for further questions should they arise Sid Seats RN Navigator Cardiac Imaging [facility] [phone] office [phone] cell

## 2024-05-18 NOTE — Telephone Encounter (Signed)
 Attempted to call patient regarding upcoming cardiac CT appointment. Left message on voicemail with name and callback number Sid Seats RN Navigator Cardiac Imaging [facility] [phone] Office

## 2024-05-21 ENCOUNTER — Ambulatory Visit (HOSPITAL_COMMUNITY)
Admission: RE | Admit: 2024-05-21 | Discharge: 2024-05-21 | Disposition: A | Discharge status: Home / Self Care | Source: Ambulatory Visit | Attending: Internal Medicine | Admitting: Internal Medicine

## 2024-05-21 DIAGNOSIS — R0789 Other chest pain: Secondary | ICD-10-CM | POA: Insufficient documentation

## 2024-05-21 DIAGNOSIS — I2583 Coronary atherosclerosis due to lipid rich plaque: Secondary | ICD-10-CM | POA: Diagnosis not present

## 2024-05-21 DIAGNOSIS — K449 Diaphragmatic hernia without obstruction or gangrene: Secondary | ICD-10-CM | POA: Diagnosis not present

## 2024-05-21 DIAGNOSIS — I2510 Atherosclerotic heart disease of native coronary artery without angina pectoris: Secondary | ICD-10-CM | POA: Insufficient documentation

## 2024-05-21 MED ORDER — NITROGLYCERIN 0.4 MG SL SUBL
0.8000 mg | SUBLINGUAL_TABLET | Freq: Once | SUBLINGUAL | Status: AC
  Administered 2024-05-21: 0.8 mg via SUBLINGUAL

## 2024-05-21 MED ORDER — IOHEXOL 350 MG/ML SOLN
100.0000 mL | Freq: Once | INTRAVENOUS | Status: AC | PRN
  Administered 2024-05-21: 100 mL via INTRAVENOUS

## 2024-05-22 ENCOUNTER — Other Ambulatory Visit: Payer: Self-pay | Admitting: Internal Medicine

## 2024-05-23 ENCOUNTER — Other Ambulatory Visit: Payer: Self-pay | Admitting: Internal Medicine

## 2024-05-23 DIAGNOSIS — R0789 Other chest pain: Secondary | ICD-10-CM

## 2024-05-23 DIAGNOSIS — G720 Drug-induced myopathy: Secondary | ICD-10-CM

## 2024-05-23 DIAGNOSIS — I2583 Coronary atherosclerosis due to lipid rich plaque: Secondary | ICD-10-CM

## 2024-05-24 ENCOUNTER — Encounter: Admitting: Internal Medicine

## 2024-05-25 NOTE — Telephone Encounter (Signed)
 Pt called in for update on refill request. States that pharmacy reached out because they have not heard anything back and asked her to call.

## 2024-06-04 ENCOUNTER — Other Ambulatory Visit: Payer: Self-pay | Admitting: Internal Medicine

## 2024-06-04 DIAGNOSIS — R519 Headache, unspecified: Secondary | ICD-10-CM

## 2024-06-06 ENCOUNTER — Telehealth: Payer: Self-pay

## 2024-06-06 NOTE — Telephone Encounter (Signed)
 Copied from CRM [id number]. Topic: Clinical - Medication Refill >> Jun 06, 2024  1:58 PM Berneda F wrote: Medication: amphetamine -dextroamphetamine (ADDERALL XR) 15 MG 24 hr capsule  Has the patient contacted their pharmacy? Yes (Agent: If no, request that the patient contact the pharmacy for the refill. If patient does not wish to contact the pharmacy document the reason why and proceed with request.) (Agent: If yes, when and what did the pharmacy advise?)  This is the patient's preferred pharmacy:  [id number] - [place], [place] - [address] AT [facility] [address] [name] [address] Phone: [phone] Fax: [phone]  Is this the correct pharmacy for this prescription? Yes If no, delete pharmacy and type the correct one.   Has the prescription been filled recently? No  Is the patient out of the medication? Yes  Has the patient been seen for an appointment in the last year OR does the patient have an upcoming appointment? Yes  Can we respond through MyChart? Yes  Agent: Please be advised that Rx refills may take up to 3 business days. We ask that you follow-up with your pharmacy.

## 2024-09-05 ENCOUNTER — Ambulatory Visit
